# Patient Record
Sex: Female | Born: 1935 | Race: White | Hispanic: No | Marital: Married | Smoking: Former smoker
Health system: Southern US, Community
[De-identification: ages and names within clinical notes are randomized; demographics above are authoritative.]

## PROBLEM LIST (undated history)

## (undated) DIAGNOSIS — C801 Malignant (primary) neoplasm, unspecified: Secondary | ICD-10-CM

## (undated) DIAGNOSIS — I10 Essential (primary) hypertension: Secondary | ICD-10-CM

## (undated) DIAGNOSIS — M810 Age-related osteoporosis without current pathological fracture: Secondary | ICD-10-CM

## (undated) HISTORY — DX: Age-related osteoporosis without current pathological fracture: M81.0

## (undated) HISTORY — DX: Essential (primary) hypertension: I10

## (undated) HISTORY — PX: CATARACT EXTRACTION, BILATERAL: SHX1313

## (undated) HISTORY — PX: BREAST SURGERY: SHX581

---
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
## 1977-11-19 HISTORY — PX: BUNIONECTOMY: SHX129

## 1991-05-20 HISTORY — PX: COLON SURGERY: SHX602

## 1994-10-29 HISTORY — PX: BREAST LUMPECTOMY: SHX2

## 2005-05-14 ENCOUNTER — Ambulatory Visit: Payer: Self-pay | Admitting: Family Medicine

## 2005-07-07 ENCOUNTER — Ambulatory Visit: Payer: Self-pay | Admitting: Internal Medicine

## 2006-05-16 ENCOUNTER — Ambulatory Visit: Payer: Self-pay | Admitting: Family Medicine

## 2007-05-26 ENCOUNTER — Ambulatory Visit: Payer: Self-pay | Admitting: Family Medicine

## 2008-05-26 ENCOUNTER — Ambulatory Visit: Payer: Self-pay | Admitting: Family Medicine

## 2008-07-14 ENCOUNTER — Ambulatory Visit: Payer: Self-pay | Admitting: Gastroenterology

## 2009-06-27 ENCOUNTER — Ambulatory Visit: Payer: Self-pay | Admitting: Family Medicine

## 2009-07-06 ENCOUNTER — Ambulatory Visit: Payer: Self-pay | Admitting: Family Medicine

## 2009-08-31 DIAGNOSIS — M25519 Pain in unspecified shoulder: Secondary | ICD-10-CM | POA: Insufficient documentation

## 2009-10-21 ENCOUNTER — Ambulatory Visit: Payer: Self-pay | Admitting: Family Medicine

## 2009-10-24 ENCOUNTER — Emergency Department: Payer: Self-pay | Admitting: Emergency Medicine

## 2009-12-22 ENCOUNTER — Ambulatory Visit: Payer: Self-pay | Admitting: Orthopedic Surgery

## 2010-01-11 DIAGNOSIS — K5900 Constipation, unspecified: Secondary | ICD-10-CM | POA: Insufficient documentation

## 2010-01-20 ENCOUNTER — Encounter: Admission: RE | Admit: 2010-01-20 | Discharge: 2010-01-20 | Payer: Self-pay | Admitting: Orthopedic Surgery

## 2010-01-25 ENCOUNTER — Ambulatory Visit (HOSPITAL_BASED_OUTPATIENT_CLINIC_OR_DEPARTMENT_OTHER): Admission: RE | Admit: 2010-01-25 | Discharge: 2010-01-26 | Payer: Self-pay | Admitting: Orthopedic Surgery

## 2010-01-25 HISTORY — PX: ROTATOR CUFF REPAIR: SHX139

## 2010-07-11 ENCOUNTER — Ambulatory Visit: Payer: Self-pay | Admitting: Family Medicine

## 2011-07-20 ENCOUNTER — Ambulatory Visit: Payer: Self-pay | Admitting: Family Medicine

## 2012-06-12 ENCOUNTER — Ambulatory Visit: Payer: Self-pay | Admitting: Ophthalmology

## 2012-06-12 DIAGNOSIS — I499 Cardiac arrhythmia, unspecified: Secondary | ICD-10-CM

## 2012-06-24 ENCOUNTER — Ambulatory Visit: Payer: Self-pay | Admitting: Ophthalmology

## 2012-07-02 ENCOUNTER — Ambulatory Visit: Payer: Self-pay | Admitting: Family Medicine

## 2012-07-22 ENCOUNTER — Ambulatory Visit: Payer: Self-pay | Admitting: Family Medicine

## 2012-07-29 ENCOUNTER — Ambulatory Visit: Payer: Self-pay | Admitting: Ophthalmology

## 2012-09-30 ENCOUNTER — Ambulatory Visit: Payer: Self-pay | Admitting: Unknown Physician Specialty

## 2012-09-30 LAB — HM COLONOSCOPY

## 2013-09-15 ENCOUNTER — Ambulatory Visit: Payer: Self-pay | Admitting: Family Medicine

## 2014-09-13 LAB — BASIC METABOLIC PANEL
BUN: 16 mg/dL (ref 4–21)
Creatinine: 0.8 mg/dL (ref 0.5–1.1)
Glucose: 98 mg/dL
SODIUM: 142 mmol/L (ref 137–147)

## 2014-09-13 LAB — LIPID PANEL
Cholesterol: 212 mg/dL — AB (ref 0–200)
HDL: 70 mg/dL (ref 35–70)
LDL CALC: 128 mg/dL
LDL/HDL RATIO: 1.8
Triglycerides: 71 mg/dL (ref 40–160)

## 2014-09-13 LAB — CBC AND DIFFERENTIAL: WBC: 4.1 10^3/mL

## 2014-09-13 LAB — TSH: TSH: 2.07 u[IU]/mL (ref 0.41–5.90)

## 2014-09-16 ENCOUNTER — Ambulatory Visit: Payer: Self-pay | Admitting: Family Medicine

## 2014-09-16 LAB — HM MAMMOGRAPHY

## 2014-11-23 ENCOUNTER — Ambulatory Visit: Payer: Self-pay | Admitting: Family Medicine

## 2014-11-23 LAB — HM DEXA SCAN

## 2015-03-08 NOTE — Op Note (Signed)
PATIENT NAME:  Lisa Hayden, PERL MR#:  [id number] DATE OF BIRTH:  Jan 06, 1930  DATE OF PROCEDURE:  06/24/2012  PREOPERATIVE DIAGNOSIS: Visually significant cataract of the right eye.   POSTOPERATIVE DIAGNOSIS: Visually significant cataract of the right eye.   OPERATIVE PROCEDURE: Cataract extraction by phacoemulsification with implant of intraocular lens to right eye.   SURGEON: Birder Robson, MD.   ANESTHESIA:  1. Managed anesthesia care.  2. Topical tetracaine drops followed by 2% Xylocaine jelly applied in the preoperative holding area.   COMPLICATIONS: None.   TECHNIQUE:  Stop and chop.   DESCRIPTION OF PROCEDURE: The patient was examined and consented in the preoperative holding area where the aforementioned topical anesthesia was applied to the right eye.  The patient was brought back to the Operating Room where he was sat upright on the gurney and given a target to fixate upon while the eye was marked at the 3:00 and 9:00 position.  The patient was then reclined on the operating table, and lidocaine jelly was applied to the right eye.  The eye was prepped and draped in the usual sterile ophthalmic fashion and a lid speculum was placed. A paracentesis was created with the side port blade and the anterior chamber was filled with viscoelastic. A near clear corneal incision was performed with the steel keratome. A continuous curvilinear capsulorrhexis was performed with a cystotome followed by the capsulorrhexis forceps. Hydrodissection and hydrodelineation were carried out with BSS on a blunt cannula. The lens was removed in a stop and chop technique and the remaining cortical material was removed with the irrigation-aspiration handpiece. The eye was inflated with viscoelastic and the ZCT150 17.5-diopter lens, serial number [id number]  was placed in the eye and rotated to within a few degrees of the predetermined orientation at 150 degrees.  The remaining viscoelastic was removed from the  eye.  The Sinskey hook was used to rotate the toric lens into its final resting place at 60 degrees.  The eye was inflated to a physiologic pressure and found to be watertight.  The eye was dressed with Vigamox. The patient was given protective glasses to wear throughout the day and a shield with which to sleep tonight. The patient was also given drops with which to begin a drop regimen today and will follow-up with me in one day.  ____________________________ Livingston Diones. Hiawatha Dressel, MD wlp:slb D: 06/24/2012 12:32:59 ET T: 06/24/2012 13:27:34 ET JOB#: [id number]  cc: Codie Hainer L. Catlyn Shipton, MD, <Dictator> Livingston Diones Shanel Prazak MD ELECTRONICALLY SIGNED 06/25/2012 16:29

## 2015-03-08 NOTE — Op Note (Signed)
PATIENT NAME:  Lisa Hayden, Lisa Hayden MR#:  [id number] DATE OF BIRTH:  [birth year]  DATE OF PROCEDURE:  07/29/2012  PREOPERATIVE DIAGNOSIS: Visually significant cataract of the left eye.   POSTOPERATIVE DIAGNOSIS: Visually significant cataract of the left eye.   OPERATIVE PROCEDURE: Cataract extraction by phacoemulsification with implant of intraocular lens to left eye.   SURGEON: Birder Robson, MD.   ANESTHESIA:  1. Managed anesthesia care.  2. Topical tetracaine drops followed by 2% Xylocaine jelly applied in the preoperative holding area.   COMPLICATIONS: None.   TECHNIQUE:  Stop and chop.   DESCRIPTION OF PROCEDURE: The patient was examined and consented in the preoperative holding area where the aforementioned topical anesthesia was applied to the left eye and then brought back to the Operating Room where the left eye was prepped and draped in the usual sterile ophthalmic fashion and a lid speculum was placed. A paracentesis was created with the side port blade and the anterior chamber was filled with viscoelastic. A near clear corneal incision was performed with the steel keratome. A continuous curvilinear capsulorrhexis was performed with a cystotome followed by the capsulorrhexis forceps. Hydrodissection and hydrodelineation were carried out with BSS on a blunt cannula. The lens was removed in a stop and chop technique and the remaining cortical material was removed with the irrigation-aspiration handpiece. The capsular bag was inflated with viscoelastic and the Technis ZCB00 17.5-diopter lens, serial number [id number] was placed in the capsular bag without complication. The remaining viscoelastic was removed from the eye with the irrigation-aspiration handpiece. The wounds were hydrated. The anterior chamber was flushed with Miostat and the eye was inflated to physiologic pressure. The wounds were found to be water tight. The eye was dressed with Vigamox. The patient was given protective  glasses to wear throughout the day and a shield with which to sleep tonight. The patient was also given drops with which to begin a drop regimen today and will follow-up with me in one day.   ____________________________ Livingston Diones. Jamien Casanova, MD wlp:cms D: 07/29/2012 17:17:22 ET T: 07/29/2012 17:37:21 ET JOB#: [id number]  cc: Excell Neyland L. Levonne Carreras, MD, <Dictator> Livingston Diones Jenney Brester MD ELECTRONICALLY SIGNED 07/31/2012 13:13

## 2015-03-10 ENCOUNTER — Ambulatory Visit
Admit: 2015-03-10 | Disposition: A | Discharge status: Home / Self Care | Payer: Self-pay | Attending: Urology | Admitting: Urology

## 2015-05-02 ENCOUNTER — Telehealth: Payer: Self-pay | Admitting: Family Medicine

## 2015-05-02 ENCOUNTER — Encounter: Payer: Self-pay | Admitting: Emergency Medicine

## 2015-05-02 ENCOUNTER — Emergency Department
Admission: EM | Admit: 2015-05-02 | Discharge: 2015-05-02 | Disposition: A | Discharge status: Home / Self Care | Payer: Medicare Other | Attending: Emergency Medicine | Admitting: Emergency Medicine

## 2015-05-02 ENCOUNTER — Other Ambulatory Visit: Payer: Self-pay

## 2015-05-02 ENCOUNTER — Emergency Department: Payer: Medicare Other

## 2015-05-02 DIAGNOSIS — Z87891 Personal history of nicotine dependence: Secondary | ICD-10-CM | POA: Diagnosis not present

## 2015-05-02 DIAGNOSIS — R000 Tachycardia, unspecified: Secondary | ICD-10-CM | POA: Insufficient documentation

## 2015-05-02 DIAGNOSIS — J45901 Unspecified asthma with (acute) exacerbation: Secondary | ICD-10-CM | POA: Insufficient documentation

## 2015-05-02 DIAGNOSIS — R0602 Shortness of breath: Secondary | ICD-10-CM | POA: Diagnosis present

## 2015-05-02 HISTORY — DX: Malignant (primary) neoplasm, unspecified: C80.1

## 2015-05-02 LAB — COMPREHENSIVE METABOLIC PANEL
ALK PHOS: 37 U/L — AB (ref 38–126)
ALT: 17 U/L (ref 14–54)
ANION GAP: 7 (ref 5–15)
AST: 23 U/L (ref 15–41)
Albumin: 4.1 g/dL (ref 3.5–5.0)
BILIRUBIN TOTAL: 0.9 mg/dL (ref 0.3–1.2)
BUN: 15 mg/dL (ref 6–20)
CALCIUM: 9.2 mg/dL (ref 8.9–10.3)
CO2: 28 mmol/L (ref 22–32)
CREATININE: 0.77 mg/dL (ref 0.44–1.00)
Chloride: 106 mmol/L (ref 101–111)
GFR calc non Af Amer: >60 mL/min (ref >60)
GLUCOSE: 155 mg/dL — AB (ref 65–99)
Potassium: 4 mmol/L (ref 3.5–5.1)
SODIUM: 141 mmol/L (ref 135–145)
TOTAL PROTEIN: 7.1 g/dL (ref 6.5–8.1)

## 2015-05-02 LAB — CBC
HCT: 45.7 % (ref 35.0–47.0)
HEMOGLOBIN: 14.8 g/dL (ref 12.0–16.0)
MCH: 29.4 pg (ref 26.0–34.0)
MCHC: 32.4 g/dL (ref 32.0–36.0)
MCV: 90.9 fL (ref 80.0–100.0)
Platelets: 234 10*3/uL (ref 150–440)
RBC: 5.02 MIL/uL (ref 3.80–5.20)
RDW: 13.3 % (ref 11.5–14.5)
WBC: 5.4 10*3/uL (ref 3.6–11.0)

## 2015-05-02 LAB — FIBRIN DERIVATIVES D-DIMER (ARMC ONLY): FIBRIN DERIVATIVES D-DIMER (ARMC): 236.88 (ref 0–499)

## 2015-05-02 LAB — TROPONIN I

## 2015-05-02 MED ORDER — IPRATROPIUM-ALBUTEROL 0.5-2.5 (3) MG/3ML IN SOLN
3.0000 mL | Freq: Once | RESPIRATORY_TRACT | Status: AC
  Administered 2015-05-02: 3 mL via RESPIRATORY_TRACT

## 2015-05-02 MED ORDER — IPRATROPIUM-ALBUTEROL 0.5-2.5 (3) MG/3ML IN SOLN
RESPIRATORY_TRACT | Status: AC
Start: 2015-05-02 — End: 2015-05-02
  Administered 2015-05-02: 3 mL via RESPIRATORY_TRACT
  Filled 2015-05-02: qty 3

## 2015-05-02 MED ORDER — ALBUTEROL SULFATE HFA 108 (90 BASE) MCG/ACT IN AERS: 2.0000 | INHALATION_SPRAY | Freq: Four times a day (QID) | RESPIRATORY_TRACT | Status: DC | PRN

## 2015-05-02 NOTE — Telephone Encounter (Signed)
Pt was discharged today from [facility] ER for asthma attack.  Where can I work pt in?  CB#[phone] or [phone]

## 2015-05-02 NOTE — Telephone Encounter (Signed)
Appointment scheduled for 05/04/2015@12 .00/MJ

## 2015-05-02 NOTE — ED Notes (Signed)
SOB x 4 days, states history of asthma but not recent, tight cough noted

## 2015-05-02 NOTE — Discharge Instructions (Signed)
Please seek medical attention for any high fevers, chest pain, shortness of breath, change in behavior, persistent vomiting, bloody stool or any other new or concerning symptoms. ° °Shortness of Breath °Shortness of breath means you have trouble breathing. It could also mean that you have a medical problem. You should get immediate medical care for shortness of breath. °CAUSES  °· Not enough oxygen in the air such as with high altitudes or a smoke-filled room. °· Certain lung diseases, infections, or problems. °· Heart disease or conditions, such as angina or heart failure. °· Low red blood cells (anemia). °· Poor physical fitness, which can cause shortness of breath when you exercise. °· Chest or back injuries or stiffness. °· Being overweight. °· Smoking. °· Anxiety, which can make you feel like you are not getting enough air. °DIAGNOSIS  °Serious medical problems can often be found during your physical exam. Tests may also be done to determine why you are having shortness of breath. Tests may include: °· Chest X-rays. °· Lung function tests. °· Blood tests. °· An electrocardiogram (ECG). °· An ambulatory electrocardiogram. An ambulatory ECG records your heartbeat patterns over a 24-hour period. °· Exercise testing. °· A transthoracic echocardiogram (TTE). During echocardiography, sound waves are used to evaluate how blood flows through your heart. °· A transesophageal echocardiogram (TEE). °· Imaging scans. °Your health care provider may not be able to find a cause for your shortness of breath after your exam. In this case, it is important to have a follow-up exam with your health care provider as directed.  °TREATMENT  °Treatment for shortness of breath depends on the cause of your symptoms and can vary greatly. °HOME CARE INSTRUCTIONS  °· Do not smoke. Smoking is a common cause of shortness of breath. If you smoke, ask for help to quit. °· Avoid being around chemicals or things that may bother your breathing,  such as paint fumes and dust. °· Rest as needed. Slowly resume your usual activities. °· If medicines were prescribed, take them as directed for the full length of time directed. This includes oxygen and any inhaled medicines. °· Keep all follow-up appointments as directed by your health care provider. °SEEK MEDICAL CARE IF:  °· Your condition does not improve in the time expected. °· You have a hard time doing your normal activities even with rest. °· You have any new symptoms. °SEEK IMMEDIATE MEDICAL CARE IF:  °· Your shortness of breath gets worse. °· You feel light-headed, faint, or develop a cough not controlled with medicines. °· You start coughing up blood. °· You have pain with breathing. °· You have chest pain or pain in your arms, shoulders, or abdomen. °· You have a fever. °· You are unable to walk up stairs or exercise the way you normally do. °MAKE SURE YOU: °· Understand these instructions. °· Will watch your condition. °· Will get help right away if you are not doing well or get worse. °Document Released: 07/31/2001 Document Revised: 11/10/2013 Document Reviewed: 01/21/2012 °ExitCare® Patient Information ©2015 [employer]. This information is not intended to replace advice given to you by your health care provider. Make sure you discuss any questions you have with your health care provider. ° °

## 2015-05-02 NOTE — ED Provider Notes (Signed)
[facility] Emergency Department Provider Note    ____________________________________________  Time seen: 0820  I have reviewed the triage vital signs and the nursing notes.   HISTORY  Chief Complaint Shortness of Breath   History limited by: Not Limited   HPI Lisa Hayden is a 81 y.o. female who presents to the emergency department with 4 days of shortness of breath. She states that her symptoms were gradually getting worse until they were worse yesterday. They have actually gotten a little better today. She states that she has a history of asthma back in her 43s and that this slightly reminded her of that.She denies any fevers. Denies bringing anything up with her cough. Denies any fevers.     Past Medical History  Diagnosis Date  . Asthma   . Cancer     There are no active problems to display for this patient.   Past Surgical History  Procedure Laterality Date  . Colon surgery    . Breast surgery    . Tonsillectomy      No current outpatient prescriptions on file.  Allergies Actonel  No family history on file.  Social History History  Substance Use Topics  . Smoking status: Former Research scientist (life sciences)  . Smokeless tobacco: Not on file  . Alcohol Use: 4.2 oz/week    7 Standard drinks or equivalent per week    Review of Systems  Constitutional: Negative for fever. Cardiovascular: Negative for chest pain. Respiratory: Positive for shortness of breath. Gastrointestinal: Negative for abdominal pain, vomiting and diarrhea. Genitourinary: Negative for dysuria. Musculoskeletal: Negative for back pain. Skin: Negative for rash. Neurological: Negative for headaches, focal weakness or numbness.   10-point ROS otherwise negative.  ____________________________________________   PHYSICAL EXAM:  VITAL SIGNS: ED Triage Vitals  Enc Vitals Group     BP 05/02/15 0740 171/88 mmHg     Pulse Rate 05/02/15 0740 106     Resp 05/02/15 0740 20      Temp 05/02/15 0740 97.6 F (36.4 C)     Temp Source 05/02/15 0740 Oral     SpO2 05/02/15 0740 98 %     Weight 05/02/15 0740 130 lb (58.968 kg)     Height 05/02/15 0740 5\' 3"  (1.6 m)     Head Cir --      Peak Flow --      Pain Score 05/02/15 0741 0   Constitutional: Alert and oriented. Well appearing and in no distress. Eyes: Conjunctivae are normal. PERRL. Normal extraocular movements. ENT   Head: Normocephalic and atraumatic.   Nose: No congestion/rhinnorhea.   Mouth/Throat: Mucous membranes are moist.   Neck: No stridor. Hematological/Lymphatic/Immunilogical: No cervical lymphadenopathy. Cardiovascular: Tachycardic, regular rhythm.  No murmurs, rubs, or gallops. Respiratory: Normal respiratory effort without tachypnea nor retractions. Breath sounds are clear and equal bilaterally. No wheezes/rales/rhonchi. Gastrointestinal: Soft and nontender. No distention.  Genitourinary: Deferred Musculoskeletal: Normal range of motion in all extremities. No joint effusions.  No lower extremity tenderness nor edema. Neurologic:  Normal speech and language. No gross focal neurologic deficits are appreciated. Speech is normal.  Skin:  Skin is warm, dry and intact. No rash noted. Psychiatric: Mood and affect are normal. Speech and behavior are normal. Patient exhibits appropriate insight and judgment.  ____________________________________________    LABS (pertinent positives/negatives)  Labs Reviewed  COMPREHENSIVE METABOLIC PANEL - Abnormal; Notable for the following:    Glucose, Bld 155 (*)    Alkaline Phosphatase 37 (*)    All other components  within normal limits  CBC  TROPONIN I  FIBRIN DERIVATIVES D-DIMER ([facility] ONLY)     ____________________________________________   EKG  I, Nance Pear, attending physician, personally viewed and interpreted this EKG  EKG Time: 0742 Rate: 107 Rhythm: sinus tachycardia Axis: left axis deviation Intervals: qtc 472 QRS:  q waves v1, v2, v3 ST changes: no st elevation    ____________________________________________    RADIOLOGY  CXR IMPRESSION: No acute cardiopulmonary disease.  ____________________________________________   PROCEDURES  Procedure(s) performed: None  Critical Care performed: No  ____________________________________________   INITIAL IMPRESSION / ASSESSMENT AND PLAN / ED COURSE  Pertinent labs & imaging results that were available during my care of the patient were reviewed by me and considered in my medical decision making (see chart for details).  Patient here with shortness of breath, cough or 4 days. On exam patient without any wheezing. Patient does state that she feels better today than she did yesterday. Chest x-ray blood work and EKG without concerning findings. Will plan on discharging home with albuterol inhaler. Discussed return precautions with patient.  ____________________________________________   FINAL CLINICAL IMPRESSION(S) / ED DIAGNOSES  Final diagnoses:  Shortness of breath     Nance Pear, MD 05/02/15 [phone]

## 2015-05-03 DIAGNOSIS — M549 Dorsalgia, unspecified: Secondary | ICD-10-CM | POA: Insufficient documentation

## 2015-05-03 DIAGNOSIS — Z6824 Body mass index (BMI) 24.0-24.9, adult: Secondary | ICD-10-CM | POA: Insufficient documentation

## 2015-05-03 DIAGNOSIS — N952 Postmenopausal atrophic vaginitis: Secondary | ICD-10-CM | POA: Insufficient documentation

## 2015-05-03 DIAGNOSIS — I10 Essential (primary) hypertension: Secondary | ICD-10-CM | POA: Insufficient documentation

## 2015-05-03 DIAGNOSIS — N904 Leukoplakia of vulva: Secondary | ICD-10-CM | POA: Insufficient documentation

## 2015-05-03 DIAGNOSIS — E559 Vitamin D deficiency, unspecified: Secondary | ICD-10-CM | POA: Insufficient documentation

## 2015-05-03 DIAGNOSIS — D709 Neutropenia, unspecified: Secondary | ICD-10-CM | POA: Insufficient documentation

## 2015-05-03 DIAGNOSIS — M79673 Pain in unspecified foot: Secondary | ICD-10-CM | POA: Insufficient documentation

## 2015-05-03 DIAGNOSIS — M778 Other enthesopathies, not elsewhere classified: Secondary | ICD-10-CM | POA: Insufficient documentation

## 2015-05-03 DIAGNOSIS — IMO0001 Reserved for inherently not codable concepts without codable children: Secondary | ICD-10-CM | POA: Insufficient documentation

## 2015-05-03 DIAGNOSIS — C50919 Malignant neoplasm of unspecified site of unspecified female breast: Secondary | ICD-10-CM | POA: Insufficient documentation

## 2015-05-03 DIAGNOSIS — J45909 Unspecified asthma, uncomplicated: Secondary | ICD-10-CM | POA: Insufficient documentation

## 2015-05-03 DIAGNOSIS — R030 Elevated blood-pressure reading, without diagnosis of hypertension: Secondary | ICD-10-CM

## 2015-05-03 DIAGNOSIS — M79606 Pain in leg, unspecified: Secondary | ICD-10-CM | POA: Insufficient documentation

## 2015-05-04 ENCOUNTER — Ambulatory Visit (INDEPENDENT_AMBULATORY_CARE_PROVIDER_SITE_OTHER): Payer: Medicare Other | Admitting: Family Medicine

## 2015-05-04 ENCOUNTER — Encounter: Payer: Self-pay | Admitting: Family Medicine

## 2015-05-04 VITALS — BP 126/84 | HR 84 | Temp 97.5°F | Resp 16 | Wt 134.0 lb

## 2015-05-04 DIAGNOSIS — J45909 Unspecified asthma, uncomplicated: Secondary | ICD-10-CM

## 2015-05-04 DIAGNOSIS — Z1389 Encounter for screening for other disorder: Secondary | ICD-10-CM | POA: Diagnosis not present

## 2015-05-04 DIAGNOSIS — J40 Bronchitis, not specified as acute or chronic: Secondary | ICD-10-CM

## 2015-05-04 MED ORDER — PREDNISONE 10 MG PO TABS: 10.0000 mg | ORAL_TABLET | Freq: Every day | ORAL | Status: DC

## 2015-05-04 MED ORDER — AZITHROMYCIN 250 MG PO TABS: ORAL_TABLET | ORAL | Status: DC

## 2015-05-04 NOTE — Progress Notes (Signed)
Subjective:    Patient ID: Lisa Hayden, female    DOB: 04/20/1930, 81 y.o.   MRN: [id number]  Asthma She complains of cough, difficulty breathing, frequent throat clearing, hoarse voice, shortness of breath and wheezing. There is no chest tightness or hemoptysis. Sputum production: green. This is a recurrent problem. The current episode started in the past 7 days. The problem occurs constantly. The cough is productive of sputum. Associated symptoms include postnasal drip and rhinorrhea. Pertinent negatives include no appetite change, chest pain, ear pain, fever, headaches, sneezing, sore throat or trouble swallowing. Her symptoms are aggravated by pollen. Her symptoms are alleviated by beta-agonist. She reports minimal improvement on treatment. Her past medical history is significant for asthma.    Follow up Hospitalization  Patient was admitted to the ED on 05/02/2015. She was treated for an asthma attack. Treatment for this included Albuterol treatment, and RX for Ventolin. She reports compliance with treatment. She reports this condition is Improved. (But has reoccurrence)   Also reviewed records from [place] [facility] and explained findings to patient. Has handout of vaginal atrophy, and would like to be treated for that. Has not been using steroid cream for Lichen Sclerosis. Will start using and follow up with Gyn.    ------------------------------------------------------------------------------------     Review of Systems  Constitutional: Positive for fatigue. Negative for fever, chills, diaphoresis, activity change, appetite change and unexpected weight change.  HENT: Positive for congestion, hoarse voice, postnasal drip and rhinorrhea. Negative for drooling, ear discharge, ear pain, facial swelling, hearing loss, mouth sores, nosebleeds, sinus pressure, sneezing, sore throat, tinnitus, trouble swallowing and voice change.   Eyes: Positive for redness and itching. Negative for  photophobia, pain, discharge and visual disturbance.  Respiratory: Positive for cough, shortness of breath and wheezing. Negative for apnea, hemoptysis, choking, chest tightness and stridor. Sputum production: green.   Cardiovascular: Negative for chest pain, palpitations and leg swelling.  Neurological: Negative for dizziness, facial asymmetry, light-headedness and headaches.   Patient Active Problem List   Diagnosis Date Noted  . Reactive airway disease 05/04/2015  . Bronchitis 05/04/2015  . Asthma without status asthmaticus 05/03/2015  . Back ache 05/03/2015  . Body mass index (BMI) of 24.0-24.9 in adult 05/03/2015  . Breast CA 05/03/2015  . Blood pressure elevated 05/03/2015  . Foot pain 05/03/2015  . BP (high blood pressure) 05/03/2015  . Leg pain 05/03/2015  . Lichen sclerosus of female genitalia 05/03/2015  . Neutropenia 05/03/2015  . Tendinitis of wrist 05/03/2015  . Atrophy of vagina 05/03/2015  . Avitaminosis D 05/03/2015  . CN (constipation) 01/11/2010  . Mechanical and motor problems with internal organs 09/21/2009  . Arthralgia of shoulder 08/31/2009  . Personal history of malignant neoplasm of breast 04/13/2009  . OP (osteoporosis) 04/13/2009   Past Medical History  Diagnosis Date  . Asthma   . Cancer   . Hypertension   . Osteoporosis    Current Outpatient Prescriptions on File Prior to Visit  Medication Sig  . albuterol (PROVENTIL HFA;VENTOLIN HFA) 108 (90 BASE) MCG/ACT inhaler Inhale 2 puffs into the lungs every 6 (six) hours as needed for wheezing or shortness of breath.  Marland Kitchen alendronate (FOSAMAX) 70 MG tablet Take 70 mg by mouth once a week. On Sunday  . cholecalciferol (VITAMIN D) 1000 UNITS tablet Take 1,000 Units by mouth every evening. With dinner  . Omega-3 Fatty Acids (FISH OIL) 1000 MG CAPS Take 1 capsule by mouth daily.   No current facility-administered medications on  file prior to visit.   Allergies  Allergen Reactions  . Actonel [Risedronate  Sodium] Other (See Comments)    headache  . Cortisone     very alert and did not sleep for 24 hrs  . Daypro  [Oxaprozin]   . Meloxicam    Past Surgical History  Procedure Laterality Date  . Breast surgery    . Tonsillectomy  1938    and adenoidectomy  . Cataract extraction, bilateral      left 07/29/2012; right 06/24/2012  . Rotator cuff repair Right 01/25/10  . Bunionectomy  1979  . Appendectomy  1953  . Breast lumpectomy Left 10/29/94    negative nodes. Treated with radiation only  . Colon surgery  05/1991    secondary to cancer    History   Social History  . Marital Status: Married    Spouse Name: [name]  . Number of Children: 1  . Years of Education: College   Occupational History  . Retired    Social History Main Topics  . Smoking status: Former Research scientist (life sciences)  . Smokeless tobacco: Never Used  . Alcohol Use: 4.2 oz/week    7 Standard drinks or equivalent per week  . Drug Use: No  . Sexual Activity: Not on file   Other Topics Concern  . Not on file   Social History Narrative   Family History  Problem Relation Age of Onset  . CVA Mother   . Congestive Heart Failure Mother   . Diabetes Sister   . Asthma Father         Objective:   Physical Exam  Constitutional: She appears well-developed and well-nourished.  Cardiovascular: Normal rate and regular rhythm.   Pulmonary/Chest: Effort normal. She has wheezes.    BP 126/84 mmHg  Pulse 84  Temp(Src) 97.5 F (36.4 C) (Oral)  Resp 16  Wt 134 lb (60.782 kg)       Assessment & Plan:    1. Asthma, unspecified asthma severity, uncomplicated Will treat with prednisone. Continue inhaler as needed and reassess in 2 weeks.   - predniSONE (DELTASONE) 10 MG tablet; Take 1 tablet (10 mg total) by mouth daily with breakfast. 6 for one day, and the 5 for one day and then 4 for one day and then 3 for one day and 2 for one day and then 1 pill  Dispense: 21 tablet; Refill: 0  2. Bronchitis Condition is worsening. Will  start medication for better control.   Recheck in 2 weeks.  - azithromycin (ZITHROMAX) 250 MG tablet; 2 tabs on day one and then one a day.  Dispense: 6 tablet; Refill: 0  Margarita Rana, MD

## 2015-05-18 ENCOUNTER — Ambulatory Visit (INDEPENDENT_AMBULATORY_CARE_PROVIDER_SITE_OTHER): Payer: Medicare Other | Admitting: Family Medicine

## 2015-05-18 ENCOUNTER — Encounter: Payer: Self-pay | Admitting: Family Medicine

## 2015-05-18 VITALS — BP 136/70 | HR 80 | Temp 97.8°F | Resp 16 | Wt 135.0 lb

## 2015-05-18 DIAGNOSIS — J40 Bronchitis, not specified as acute or chronic: Secondary | ICD-10-CM

## 2015-05-18 NOTE — Progress Notes (Signed)
Subjective:    Patient ID: Lisa Hayden, female    DOB: 19-Dec-1929, 81 y.o.   MRN: [id number]  Asthma She complains of cough. There is no chest tightness, difficulty breathing, frequent throat clearing, hemoptysis, hoarse voice, shortness of breath, sputum production or wheezing. This is a recurrent problem. The current episode started 1 to 4 weeks ago. The problem has been rapidly improving. The cough is non-productive. Associated symptoms include malaise/fatigue, myalgias, postnasal drip, rhinorrhea and sneezing. Pertinent negatives include no appetite change, chest pain, dyspnea on exertion, ear congestion, ear pain, fever, headaches, heartburn, nasal congestion, orthopnea, sore throat, sweats, trouble swallowing or weight loss. Her symptoms are alleviated by oral steroids and steroid inhaler. Her past medical history is significant for asthma.   Bronchitis Follow Up Pt was seen 2 weeks ago, and was put on a Z-Pak for bronchitis. Pt reports both her asthma exacerbation, as well as bronchitis is 90% improved.  Review of Systems  Constitutional: Positive for malaise/fatigue. Negative for fever, weight loss and appetite change.  HENT: Positive for postnasal drip, rhinorrhea and sneezing. Negative for ear pain, hoarse voice, sore throat and trouble swallowing.   Respiratory: Positive for cough. Negative for hemoptysis, sputum production, shortness of breath and wheezing.   Cardiovascular: Negative for chest pain and dyspnea on exertion.  Gastrointestinal: Negative for heartburn.  Musculoskeletal: Positive for myalgias.  Neurological: Negative for headaches.        Patient Active Problem List   Diagnosis Date Noted  . Reactive airway disease 05/04/2015  . Bronchitis 05/04/2015  . Asthma without status asthmaticus 05/03/2015  . Back ache 05/03/2015  . Body mass index (BMI) of 24.0-24.9 in adult 05/03/2015  . Breast CA 05/03/2015  . Blood pressure elevated 05/03/2015  . Foot pain  05/03/2015  . BP (high blood pressure) 05/03/2015  . Leg pain 05/03/2015  . Lichen sclerosus of female genitalia 05/03/2015  . Neutropenia 05/03/2015  . Tendinitis of wrist 05/03/2015  . Atrophy of vagina 05/03/2015  . Avitaminosis D 05/03/2015  . CN (constipation) 01/11/2010  . Mechanical and motor problems with internal organs 09/21/2009  . Arthralgia of shoulder 08/31/2009  . Personal history of malignant neoplasm of breast 04/13/2009  . OP (osteoporosis) 04/13/2009   Past Medical History  Diagnosis Date  . Asthma   . Cancer   . Hypertension   . Osteoporosis    Current Outpatient Prescriptions on File Prior to Visit  Medication Sig  . albuterol (PROVENTIL HFA;VENTOLIN HFA) 108 (90 BASE) MCG/ACT inhaler Inhale 2 puffs into the lungs every 6 (six) hours as needed for wheezing or shortness of breath. (Patient not taking: Reported on 05/18/2015)  . alendronate (FOSAMAX) 70 MG tablet Take 70 mg by mouth once a week. On Sunday  . cholecalciferol (VITAMIN D) 1000 UNITS tablet Take 1,000 Units by mouth every evening. With dinner  . Omega-3 Fatty Acids (FISH OIL) 1000 MG CAPS Take 1 capsule by mouth daily.   No current facility-administered medications on file prior to visit.   Allergies  Allergen Reactions  . Actonel [Risedronate Sodium] Other (See Comments)    headache  . Cortisone     very alert and did not sleep for 24 hrs  . Daypro  [Oxaprozin]   . Meloxicam    Past Surgical History  Procedure Laterality Date  . Breast surgery    . Tonsillectomy  1938    and adenoidectomy  . Cataract extraction, bilateral      left 07/29/2012; right 06/24/2012  .  Rotator cuff repair Right 01/25/10  . Bunionectomy  1979  . Appendectomy  1953  . Breast lumpectomy Left 10/29/94    negative nodes. Treated with radiation only  . Colon surgery  05/1991    secondary to cancer    History   Social History  . Marital Status: Married    Spouse Name: [name]  . Number of Children: 1  . Years of  Education: College   Occupational History  . Retired    Social History Main Topics  . Smoking status: Former Research scientist (life sciences)  . Smokeless tobacco: Never Used  . Alcohol Use: 4.2 oz/week    7 Standard drinks or equivalent per week  . Drug Use: No  . Sexual Activity: Not on file   Other Topics Concern  . Not on file   Social History Narrative   Family History  Problem Relation Age of Onset  . CVA Mother   . Congestive Heart Failure Mother   . Diabetes Sister   . Asthma Father     Objective:   Physical Exam  Constitutional: She is oriented to person, place, and time. She appears well-developed and well-nourished.  Cardiovascular: Normal rate, regular rhythm and normal heart sounds.   Pulmonary/Chest: Effort normal and breath sounds normal.  Neurological: She is alert and oriented to person, place, and time.  Psychiatric: She has a normal mood and affect. Her behavior is normal. Judgment and thought content normal.   BP 136/70 mmHg  Pulse 80  Temp(Src) 97.8 F (36.6 C) (Oral)  Resp 16  Wt 135 lb (61.236 kg)  SpO2 98%          Assessment & Plan:   1. Bronchitis  Resolved. Follow up as needed.  Keep inhaler just in case.

## 2015-11-17 ENCOUNTER — Telehealth: Payer: Self-pay | Admitting: Family Medicine

## 2015-11-17 NOTE — Telephone Encounter (Signed)
Pt contacted office for refill request on the following medications:  alendronate (FOSAMAX) 70 MG tablet.  [facility].  [id number]  Pt is asking if she should continue to take this medication?

## 2015-11-17 NOTE — Telephone Encounter (Signed)
Please review-aa 

## 2015-11-18 MED ORDER — ALENDRONATE SODIUM 70 MG PO TABS: 70.0000 mg | ORAL_TABLET | ORAL | Status: DC

## 2015-11-18 NOTE — Telephone Encounter (Signed)
Patient advised as directed below. RX sent to pharmacy.  

## 2015-11-18 NOTE — Telephone Encounter (Signed)
Usually stay on medication for 5 years. Ok to refill for one year and notify patent. Thanks.

## 2015-12-20 ENCOUNTER — Other Ambulatory Visit: Payer: Self-pay

## 2015-12-20 DIAGNOSIS — M810 Age-related osteoporosis without current pathological fracture: Secondary | ICD-10-CM

## 2015-12-20 MED ORDER — ALENDRONATE SODIUM 70 MG PO TABS: 70.0000 mg | ORAL_TABLET | ORAL | Status: DC

## 2016-05-08 ENCOUNTER — Encounter: Payer: Self-pay | Admitting: Family Medicine

## 2016-05-08 ENCOUNTER — Ambulatory Visit (INDEPENDENT_AMBULATORY_CARE_PROVIDER_SITE_OTHER): Payer: Medicare Other | Admitting: Family Medicine

## 2016-05-08 VITALS — BP 146/70 | HR 80 | Temp 97.4°F | Resp 20 | Ht 61.0 in | Wt 137.0 lb

## 2016-05-08 DIAGNOSIS — M810 Age-related osteoporosis without current pathological fracture: Secondary | ICD-10-CM | POA: Diagnosis not present

## 2016-05-08 DIAGNOSIS — I10 Essential (primary) hypertension: Secondary | ICD-10-CM | POA: Diagnosis not present

## 2016-05-08 DIAGNOSIS — Z0000 Encounter for general adult medical examination without abnormal findings: Secondary | ICD-10-CM

## 2016-05-08 DIAGNOSIS — Z1231 Encounter for screening mammogram for malignant neoplasm of breast: Secondary | ICD-10-CM

## 2016-05-08 DIAGNOSIS — R2681 Unsteadiness on feet: Secondary | ICD-10-CM

## 2016-05-08 DIAGNOSIS — Z780 Asymptomatic menopausal state: Secondary | ICD-10-CM

## 2016-05-08 DIAGNOSIS — R42 Dizziness and giddiness: Secondary | ICD-10-CM

## 2016-05-08 DIAGNOSIS — C189 Malignant neoplasm of colon, unspecified: Secondary | ICD-10-CM | POA: Insufficient documentation

## 2016-05-08 NOTE — Progress Notes (Signed)
Patient: Lisa Hayden, Female    DOB: 1930/06/19, 82 y.o.   MRN: [id number] Visit Date: 05/08/2016  Today's Provider: Margarita Rana, MD   Chief Complaint  Patient presents with  . Medicare Wellness   Subjective:    Annual wellness visit Lisa Hayden is a 82 y.o. female. She feels well. She reports exercising 2 days a week. She reports she is sleeping well. 09/16/14 Mammogram-BI-RADS 2 11/23/14 BMD-Osteoporosis 09/30/12 Colonoscopy-polyp, internal hemorrhoids, Dr. Vira Agar -----------------------------------------------------------  Vertigo - Dizziness: Patient presents with dizziness .  The dizziness has been present for 2 days. The patient describes the symptoms as vertigo. Symptoms are exacerbated by looking rising from supine position The patient also complains of none. Patient denies otalgia.  She has been treated with nothing.    Review of Systems  Constitutional: Negative.        Irritability  HENT: Negative.   Eyes: Negative.   Respiratory: Negative.   Cardiovascular: Negative.   Gastrointestinal: Negative.   Endocrine: Negative.   Genitourinary: Negative.   Musculoskeletal: Positive for back pain and neck stiffness.  Skin: Negative.   Allergic/Immunologic: Negative.   Neurological: Positive for dizziness.  Hematological: Negative.   Psychiatric/Behavioral: Negative.     Social History   Social History  . Marital Status: Married    Spouse Name: [name]  . Number of Children: 1  . Years of Education: College   Occupational History  . Retired    Social History Main Topics  . Smoking status: Former Research scientist (life sciences)  . Smokeless tobacco: Never Used  . Alcohol Use: 4.2 oz/week    7 Standard drinks or equivalent per week  . Drug Use: No  . Sexual Activity: Not on file   Other Topics Concern  . Not on file   Social History Narrative    Past Medical History  Diagnosis Date  . Asthma   . Cancer ([place])   . Hypertension   . Osteoporosis       Patient Active Problem List   Diagnosis Date Noted  . Malignant neoplasm of colon ([place]) 05/08/2016  . Reactive airway disease 05/04/2015  . Bronchitis 05/04/2015  . Asthma without status asthmaticus 05/03/2015  . Back ache 05/03/2015  . Body mass index (BMI) of 24.0-24.9 in adult 05/03/2015  . Breast CA ([place]) 05/03/2015  . Blood pressure elevated 05/03/2015  . Foot pain 05/03/2015  . BP (high blood pressure) 05/03/2015  . Leg pain 05/03/2015  . Lichen sclerosus of female genitalia 05/03/2015  . Neutropenia ([place]) 05/03/2015  . Tendinitis of wrist 05/03/2015  . Atrophy of vagina 05/03/2015  . Avitaminosis D 05/03/2015  . CN (constipation) 01/11/2010  . Mechanical and motor problems with internal organs 09/21/2009  . Arthralgia of shoulder 08/31/2009  . Personal history of malignant neoplasm of breast 04/13/2009  . OP (osteoporosis) 04/13/2009    Past Surgical History  Procedure Laterality Date  . Breast surgery    . Tonsillectomy  1938    and adenoidectomy  . Cataract extraction, bilateral      left 07/29/2012; right 06/24/2012  . Rotator cuff repair Right 01/25/10  . Bunionectomy  1979  . Appendectomy  1953  . Breast lumpectomy Left 10/29/94    negative nodes. Treated with radiation only  . Colon surgery  05/1991    secondary to cancer     Her family history includes Asthma in her father; CVA in her mother; Congestive Heart Failure in her mother; Diabetes in her sister.  Current Meds  Medication Sig  . alendronate (FOSAMAX) 70 MG tablet Take 1 tablet (70 mg total) by mouth once a week. On Sunday  . cholecalciferol (VITAMIN D) 1000 UNITS tablet Take 1,000 Units by mouth every evening. With dinner  . conjugated estrogens (PREMARIN) vaginal cream Place vaginally.    Patient Care Team: Margarita Rana, MD as PCP - General (Family Medicine)    Objective:   Vitals: BP 146/70 mmHg  Pulse 80  Temp(Src) 97.4 F (36.3 C) (Oral)  Resp 20  Ht 5\' 1"  (1.549 m)  Wt 137 lb  (62.143 kg)  BMI 25.90 kg/m2  Physical Exam  Constitutional: She is oriented to person, place, and time. She appears well-developed and well-nourished.  HENT:  Head: Normocephalic and atraumatic.  Right Ear: Tympanic membrane, external ear and ear canal normal.  Left Ear: Tympanic membrane, external ear and ear canal normal.  Nose: Nose normal.  Mouth/Throat: Uvula is midline, oropharynx is clear and moist and mucous membranes are normal.  Eyes: Conjunctivae, EOM and lids are normal. Pupils are equal, round, and reactive to light.  Neck: Trachea normal and normal range of motion. Neck supple. Carotid bruit is not present. No thyroid mass and no thyromegaly present.  Cardiovascular: Normal rate, regular rhythm and normal heart sounds.   Pulmonary/Chest: Effort normal and breath sounds normal.  Abdominal: Soft. Normal appearance and bowel sounds are normal. There is no hepatosplenomegaly. There is no tenderness.  Genitourinary: No breast swelling, tenderness or discharge.  Musculoskeletal: Normal range of motion.  Lymphadenopathy:    She has no cervical adenopathy.    She has no axillary adenopathy.  Neurological: She is alert and oriented to person, place, and time. She has normal strength and normal reflexes. No cranial nerve deficit. She displays a negative Romberg sign.  Skin: Skin is warm, dry and intact.  Psychiatric: She has a normal mood and affect. Her speech is normal and behavior is normal. Judgment and thought content normal. Cognition and memory are normal.    Activities of Daily Living In your present state of health, do you have any difficulty performing the following activities: 05/08/2016  Hearing? N  Vision? N  Difficulty concentrating or making decisions? N  Walking or climbing stairs? N  Dressing or bathing? N  Doing errands, shopping? N    Fall Risk Assessment Fall Risk  05/08/2016 05/18/2015  Falls in the past year? No No     Depression Screen PHQ 2/9 Scores  05/08/2016 05/18/2015  PHQ - 2 Score 0 -  Exception Documentation - Other- indicate reason in comment box  Not completed - Pt currently moving out of apartment, was stressful event for her    Cognitive Testing - 6-CIT  Correct? Score   What year is it? yes 0 0 or 4  What month is it? yes 0 0 or 3  Memorize:    Pia Mau,  44,  [place],      What time is it? (within 1 hour) yes 0 0 or 3  Count backwards from 20 yes 0 0, 2, or 4  Name the months of the year yes 0 0, 2, or 4  Repeat name & address above yes 0 0, 2, 4, 6, 8, or 10       TOTAL SCORE  0/28   Interpretation:  Normal  Normal (0-7) Abnormal (8-28)       Assessment & Plan:     Annual Wellness Visit  Reviewed patient's  Family Medical History Reviewed and updated list of patient's medical providers Assessment of cognitive impairment was done Assessed patient's functional ability Established a written schedule for health screening [place] Completed and Reviewed  Exercise Activities and Dietary recommendations Goals    None      Immunization History  Administered Date(s) Administered  . Pneumococcal Conjugate-13 10/11/2014  . Pneumococcal Polysaccharide-23 05/19/2001  . Td 05/19/2001  . Tdap 03/22/2011  . Zoster 11/19/1998   ------------------------------------------------------------------------------------------------------------  1. Medicare annual wellness visit, subsequent Stable. Patient advised to continue eating healthy and exercise daily.  2. Dizzy spells New problem. Patient referred to ENT for evaluation and follow-up - Ambulatory referral to ENT  3. Essential hypertension F/U pending lab report. - CBC with Differential/Platelet - Comprehensive metabolic panel - Lipid Panel With LDL/HDL Ratio - TSH  4. OP (osteoporosis) Stable. Patient advised to continue current medication and plan of care. Patient to follow-up with Dr. Caryn Section in 6 months.  5.  Postmenopausal F/U pending lab report. - VITAMIN D 25 Hydroxy (Vit-D Deficiency, Fractures)  6. Encounter for screening mammogram for breast cancer - MM DIGITAL SCREENING BILATERAL; Future  7. Unsteady gait New problem. Patient referred to neurology for evaluation and follow-up. - Ambulatory referral to Neurology   Patient seen and examined by Dr. Jerrell Belfast, and note scribed by Philbert Riser. Dimas, CMA. I have reviewed the document for accuracy and completeness and I agree with above. - Jerrell Belfast, MD   Margarita Rana, MD  [facility]

## 2016-05-09 ENCOUNTER — Other Ambulatory Visit: Payer: Self-pay | Admitting: Family Medicine

## 2016-05-10 ENCOUNTER — Telehealth: Payer: Self-pay

## 2016-05-10 LAB — LIPID PANEL WITH LDL/HDL RATIO
Cholesterol, Total: 230 mg/dL — ABNORMAL HIGH (ref 100–199)
HDL: 78 mg/dL (ref >39)
LDL CALC: 135 mg/dL — AB (ref 0–99)
LDL/HDL RATIO: 1.7 ratio (ref 0.0–3.2)
Triglycerides: 86 mg/dL (ref 0–149)
VLDL Cholesterol Cal: 17 mg/dL (ref 5–40)

## 2016-05-10 LAB — COMPREHENSIVE METABOLIC PANEL
A/G RATIO: 2 (ref 1.2–2.2)
ALT: 9 IU/L (ref 0–32)
AST: 14 IU/L (ref 0–40)
Albumin: 4.3 g/dL (ref 3.5–4.7)
Alkaline Phosphatase: 39 IU/L (ref 39–117)
BUN/Creatinine Ratio: 16 (ref 12–28)
BUN: 13 mg/dL (ref 8–27)
Bilirubin Total: 0.9 mg/dL (ref 0.0–1.2)
CALCIUM: 9.5 mg/dL (ref 8.7–10.3)
CO2: 25 mmol/L (ref 18–29)
Chloride: 101 mmol/L (ref 96–106)
Creatinine, Ser: 0.8 mg/dL (ref 0.57–1.00)
GFR, EST AFRICAN AMERICAN: 78 mL/min/{1.73_m2} (ref >59)
GFR, EST NON AFRICAN AMERICAN: 68 mL/min/{1.73_m2} (ref >59)
GLOBULIN, TOTAL: 2.2 g/dL (ref 1.5–4.5)
Glucose: 90 mg/dL (ref 65–99)
POTASSIUM: 4.1 mmol/L (ref 3.5–5.2)
SODIUM: 142 mmol/L (ref 134–144)
TOTAL PROTEIN: 6.5 g/dL (ref 6.0–8.5)

## 2016-05-10 LAB — CBC WITH DIFFERENTIAL/PLATELET
BASOS: 1 %
Basophils Absolute: 0 10*3/uL (ref 0.0–0.2)
EOS (ABSOLUTE): 0.1 10*3/uL (ref 0.0–0.4)
EOS: 3 %
HEMATOCRIT: 45.4 % (ref 34.0–46.6)
Hemoglobin: 15 g/dL (ref 11.1–15.9)
IMMATURE GRANS (ABS): 0 10*3/uL (ref 0.0–0.1)
IMMATURE GRANULOCYTES: 0 %
LYMPHS: 34 %
Lymphocytes Absolute: 1.5 10*3/uL (ref 0.7–3.1)
MCH: 30.4 pg (ref 26.6–33.0)
MCHC: 33 g/dL (ref 31.5–35.7)
MCV: 92 fL (ref 79–97)
MONOCYTES: 8 %
Monocytes Absolute: 0.4 10*3/uL (ref 0.1–0.9)
NEUTROS PCT: 54 %
Neutrophils Absolute: 2.3 10*3/uL (ref 1.4–7.0)
Platelets: 275 10*3/uL (ref 150–379)
RBC: 4.93 x10E6/uL (ref 3.77–5.28)
RDW: 13.7 % (ref 12.3–15.4)
WBC: 4.3 10*3/uL (ref 3.4–10.8)

## 2016-05-10 LAB — VITAMIN D 25 HYDROXY (VIT D DEFICIENCY, FRACTURES): Vit D, 25-Hydroxy: 27.7 ng/mL — ABNORMAL LOW (ref 30.0–100.0)

## 2016-05-10 LAB — TSH: TSH: 2.91 u[IU]/mL (ref 0.450–4.500)

## 2016-05-10 NOTE — Telephone Encounter (Signed)
-----   Message from Margarita Rana, MD sent at 05/10/2016  2:20 PM EDT ----- Labs stable except for borderline VIt D. Can increase to 2000 iu daily and recheck annually.  Thanks.

## 2016-05-10 NOTE — Telephone Encounter (Signed)
Pt advised.   Thanks,   -Laura  

## 2016-08-29 ENCOUNTER — Other Ambulatory Visit: Payer: Self-pay | Admitting: Neurology

## 2016-08-29 DIAGNOSIS — R2689 Other abnormalities of gait and mobility: Secondary | ICD-10-CM

## 2016-09-03 ENCOUNTER — Ambulatory Visit
Admission: RE | Admit: 2016-09-03 | Discharge: 2016-09-03 | Disposition: A | Discharge status: Home / Self Care | Payer: Medicare Other | Source: Ambulatory Visit | Attending: Neurology | Admitting: Neurology

## 2016-09-03 DIAGNOSIS — R2689 Other abnormalities of gait and mobility: Secondary | ICD-10-CM | POA: Insufficient documentation

## 2016-09-26 ENCOUNTER — Encounter: Payer: Self-pay | Admitting: Family Medicine

## 2016-11-07 ENCOUNTER — Ambulatory Visit (INDEPENDENT_AMBULATORY_CARE_PROVIDER_SITE_OTHER): Payer: Medicare Other | Admitting: Family Medicine

## 2016-11-07 ENCOUNTER — Encounter: Payer: Self-pay | Admitting: Family Medicine

## 2016-11-07 VITALS — BP 132/72 | Temp 98.6°F | Resp 16 | Ht 62.0 in | Wt 135.0 lb

## 2016-11-07 DIAGNOSIS — M810 Age-related osteoporosis without current pathological fracture: Secondary | ICD-10-CM | POA: Diagnosis not present

## 2016-11-07 DIAGNOSIS — E559 Vitamin D deficiency, unspecified: Secondary | ICD-10-CM | POA: Diagnosis not present

## 2016-11-07 MED ORDER — ALENDRONATE SODIUM 70 MG PO TABS
70.0000 mg | ORAL_TABLET | ORAL | 4 refills | Status: DC
Start: 2016-11-07 — End: 2017-12-13

## 2016-11-07 NOTE — Progress Notes (Signed)
       Patient: Lisa Hayden Female    DOB: February 12, 1930   82 y.o.   MRN: [id number] Visit Date: 11/07/2016  Today's Provider: Lelon Huh, MD   Chief Complaint  Patient presents with  . Osteoporosis  . vitamin d deficiency   Subjective:    HPI  This is a previous patient of Dr. Venia Minks present today as new patient to me to establish care and follow up on chronic medical problems.   Osteoporosis, follow up: Patient was last seen in the office 6 months ago. No changes were made in medications since then. Patient is currently taking Alendronate 70mg  once weekly. She has been tolerating well without side effects. Patient reports that she had her BMD test on 10/30/2016 through [facility].   Vitamin D deficiency, follow up: Patient was last seen 6 months ago. Patient was advised to increase her Vitamin D supplement to 2000 units daily. She has been tolerating medication changes well without side effects.       Allergies  Allergen Reactions  . Actonel [Risedronate Sodium] Other (See Comments)    headache  . Cortisone     very alert and did not sleep for 24 hrs  . Daypro  [Oxaprozin]   . Meloxicam      Current Outpatient Prescriptions:  .  alendronate (FOSAMAX) 70 MG tablet, Take 1 tablet (70 mg total) by mouth once a week. On Sunday, Disp: 12 tablet, Rfl: 1 .  cholecalciferol (VITAMIN D) 1000 UNITS tablet, Take 1,000 Units by mouth every evening. With dinner, Disp: , Rfl:   Review of Systems  Constitutional: Negative.   Respiratory: Negative.   Cardiovascular: Negative.   Neurological: Negative.     Social History  Substance Use Topics  . Smoking status: Former Research scientist (life sciences)  . Smokeless tobacco: Never Used  . Alcohol use 4.2 oz/week    7 Standard drinks or equivalent per week   Objective:   BP 132/72 (BP Location: Right Arm, Patient Position: Sitting, Cuff Size: Normal)   Temp 98.6 F (37 C)   Resp 16   Ht 5\' 2"  (1.575 m)   Wt 135 lb (61.2 kg)   BMI 24.69 kg/m    Physical Exam   General Appearance:    Alert, cooperative, no distress  Eyes:    PERRL, conjunctiva/corneas clear, EOM's intact       Lungs:     Clear to auscultation bilaterally, respirations unlabored  Heart:    Regular rate and rhythm  Neurologic:   Awake, alert, oriented x 3. No apparent focal neurological           defect.           Assessment & Plan:     1. Osteoporosis, unspecified osteoporosis type, unspecified pathological fracture presence Doing well on alendronate. Check BMD Q2 years.  - alendronate (FOSAMAX) 70 MG tablet; Take 1 tablet (70 mg total) by mouth once a week. On Sunday  Dispense: 12 tablet; Refill: 4  2. Avitaminosis D Doing well on current vitamin D replacement.        Lelon Huh, MD  [facility]

## 2016-11-19 HISTORY — PX: ABDOMINAL HYSTERECTOMY: SHX81

## 2017-01-15 ENCOUNTER — Encounter: Payer: Self-pay | Admitting: Emergency Medicine

## 2017-01-15 ENCOUNTER — Emergency Department: Payer: Medicare Other

## 2017-01-15 ENCOUNTER — Inpatient Hospital Stay
Admission: EM | Admit: 2017-01-15 | Discharge: 2017-01-21 | DRG: 376 | Disposition: A | Discharge status: Other Acute Inpatient Hospital | Payer: Medicare Other | Attending: Internal Medicine | Admitting: Internal Medicine

## 2017-01-15 DIAGNOSIS — C786 Secondary malignant neoplasm of retroperitoneum and peritoneum: Principal | ICD-10-CM | POA: Diagnosis present

## 2017-01-15 DIAGNOSIS — Z7983 Long term (current) use of bisphosphonates: Secondary | ICD-10-CM

## 2017-01-15 DIAGNOSIS — I10 Essential (primary) hypertension: Secondary | ICD-10-CM | POA: Diagnosis present

## 2017-01-15 DIAGNOSIS — Z833 Family history of diabetes mellitus: Secondary | ICD-10-CM

## 2017-01-15 DIAGNOSIS — E43 Unspecified severe protein-calorie malnutrition: Secondary | ICD-10-CM | POA: Insufficient documentation

## 2017-01-15 DIAGNOSIS — Z7982 Long term (current) use of aspirin: Secondary | ICD-10-CM

## 2017-01-15 DIAGNOSIS — R109 Unspecified abdominal pain: Secondary | ICD-10-CM | POA: Diagnosis not present

## 2017-01-15 DIAGNOSIS — K56609 Unspecified intestinal obstruction, unspecified as to partial versus complete obstruction: Secondary | ICD-10-CM | POA: Diagnosis present

## 2017-01-15 DIAGNOSIS — C801 Malignant (primary) neoplasm, unspecified: Secondary | ICD-10-CM

## 2017-01-15 DIAGNOSIS — Z825 Family history of asthma and other chronic lower respiratory diseases: Secondary | ICD-10-CM

## 2017-01-15 DIAGNOSIS — Z853 Personal history of malignant neoplasm of breast: Secondary | ICD-10-CM

## 2017-01-15 DIAGNOSIS — J45909 Unspecified asthma, uncomplicated: Secondary | ICD-10-CM | POA: Diagnosis present

## 2017-01-15 DIAGNOSIS — R112 Nausea with vomiting, unspecified: Secondary | ICD-10-CM

## 2017-01-15 DIAGNOSIS — Z8249 Family history of ischemic heart disease and other diseases of the circulatory system: Secondary | ICD-10-CM

## 2017-01-15 DIAGNOSIS — M810 Age-related osteoporosis without current pathological fracture: Secondary | ICD-10-CM | POA: Diagnosis present

## 2017-01-15 DIAGNOSIS — Z87891 Personal history of nicotine dependence: Secondary | ICD-10-CM

## 2017-01-15 DIAGNOSIS — Z85038 Personal history of other malignant neoplasm of large intestine: Secondary | ICD-10-CM

## 2017-01-15 DIAGNOSIS — Z923 Personal history of irradiation: Secondary | ICD-10-CM

## 2017-01-15 DIAGNOSIS — Z4659 Encounter for fitting and adjustment of other gastrointestinal appliance and device: Secondary | ICD-10-CM

## 2017-01-15 DIAGNOSIS — Z823 Family history of stroke: Secondary | ICD-10-CM

## 2017-01-15 LAB — URINALYSIS, COMPLETE (UACMP) WITH MICROSCOPIC
BACTERIA UA: NONE SEEN
BILIRUBIN URINE: NEGATIVE
Glucose, UA: NEGATIVE mg/dL
Ketones, ur: NEGATIVE mg/dL
NITRITE: NEGATIVE
PROTEIN: NEGATIVE mg/dL
Specific Gravity, Urine: 1.002 — ABNORMAL LOW (ref 1.005–1.030)
pH: 7 (ref 5.0–8.0)

## 2017-01-15 LAB — COMPREHENSIVE METABOLIC PANEL
ALBUMIN: 4.2 g/dL (ref 3.5–5.0)
ALT: 14 U/L (ref 14–54)
ANION GAP: 6 (ref 5–15)
AST: 21 U/L (ref 15–41)
Alkaline Phosphatase: 31 U/L — ABNORMAL LOW (ref 38–126)
BUN: 17 mg/dL (ref 6–20)
CHLORIDE: 102 mmol/L (ref 101–111)
CO2: 26 mmol/L (ref 22–32)
Calcium: 9.2 mg/dL (ref 8.9–10.3)
Creatinine, Ser: 0.73 mg/dL (ref 0.44–1.00)
GFR calc Af Amer: >60 mL/min (ref >60)
Glucose, Bld: 162 mg/dL — ABNORMAL HIGH (ref 65–99)
POTASSIUM: 3.9 mmol/L (ref 3.5–5.1)
Sodium: 134 mmol/L — ABNORMAL LOW (ref 135–145)
Total Bilirubin: 0.9 mg/dL (ref 0.3–1.2)
Total Protein: 7.1 g/dL (ref 6.5–8.1)

## 2017-01-15 LAB — APTT: aPTT: 33 seconds (ref 24–36)

## 2017-01-15 LAB — PROTIME-INR
INR: 1.17
PROTHROMBIN TIME: 15 s (ref 11.4–15.2)

## 2017-01-15 LAB — CBC
HEMATOCRIT: 42.2 % (ref 35.0–47.0)
HEMOGLOBIN: 14.1 g/dL (ref 12.0–16.0)
MCH: 29.5 pg (ref 26.0–34.0)
MCHC: 33.5 g/dL (ref 32.0–36.0)
MCV: 88.2 fL (ref 80.0–100.0)
Platelets: 283 10*3/uL (ref 150–440)
RBC: 4.78 MIL/uL (ref 3.80–5.20)
RDW: 13.6 % (ref 11.5–14.5)
WBC: 6.3 10*3/uL (ref 3.6–11.0)

## 2017-01-15 LAB — LIPASE, BLOOD: LIPASE: 27 U/L (ref 11–51)

## 2017-01-15 LAB — TROPONIN I

## 2017-01-15 MED ORDER — IOPAMIDOL (ISOVUE-300) INJECTION 61%
30.0000 mL | Freq: Once | INTRAVENOUS | Status: AC | PRN
  Administered 2017-01-15: 30 mL via ORAL

## 2017-01-15 MED ORDER — SODIUM CHLORIDE 0.9 % IV BOLUS (SEPSIS)
1000.0000 mL | Freq: Once | INTRAVENOUS | Status: AC
Start: 2017-01-15 — End: 2017-01-16
  Administered 2017-01-15: 1000 mL via INTRAVENOUS

## 2017-01-15 MED ORDER — IOPAMIDOL (ISOVUE-300) INJECTION 61%
100.0000 mL | Freq: Once | INTRAVENOUS | Status: AC | PRN
  Administered 2017-01-15: 100 mL via INTRAVENOUS

## 2017-01-15 NOTE — ED Notes (Signed)
ngtube inserted into left nares without diff   Pt tolerated well.  Family at bedside.

## 2017-01-15 NOTE — ED Triage Notes (Signed)
Patient ambulatory to triage with steady gait, without difficulty or distress noted; pt reports x 4 months having lower abd pain with no accomp symptoms, referred to Dr Vira Agar and has appt 4/10

## 2017-01-15 NOTE — ED Provider Notes (Signed)
[facility] Emergency Department Provider Note   ____________________________________________   First MD Initiated Contact with Patient 01/15/17 1931     (approximate)  I have reviewed the triage vital signs and the nursing notes.   HISTORY  Chief Complaint Abdominal Pain   HPI Lisa Hayden is a 83 y.o. female who complains of 4 months of worsening abdominal pain and feeling of rolling sensation and grinding is getting worse. Occasional nausea and no diarrhea and occasional vomiting but not much. Patient has a 3 page typed list of symptoms times an days that occurred on which she gave him a copy of.   Past Medical History:  Diagnosis Date  . Asthma   . Cancer ([place])   . Hypertension   . Osteoporosis     Patient Active Problem List   Diagnosis Date Noted  . Malignant neoplasm of colon ([place]) 05/08/2016  . Reactive airway disease 05/04/2015  . Bronchitis 05/04/2015  . Asthma without status asthmaticus 05/03/2015  . Back ache 05/03/2015  . Body mass index (BMI) of 24.0-24.9 in adult 05/03/2015  . Breast CA ([place]) 05/03/2015  . Blood pressure elevated 05/03/2015  . Foot pain 05/03/2015  . BP (high blood pressure) 05/03/2015  . Leg pain 05/03/2015  . Lichen sclerosus of female genitalia 05/03/2015  . Neutropenia ([place]) 05/03/2015  . Tendinitis of wrist 05/03/2015  . Atrophy of vagina 05/03/2015  . Avitaminosis D 05/03/2015  . CN (constipation) 01/11/2010  . Mechanical and motor problems with internal organs 09/21/2009  . Arthralgia of shoulder 08/31/2009  . Personal history of malignant neoplasm of breast 04/13/2009  . OP (osteoporosis) 04/13/2009    Past Surgical History:  Procedure Laterality Date  . APPENDECTOMY  1953  . BREAST LUMPECTOMY Left 10/29/94   negative nodes. Treated with radiation only  . BREAST SURGERY    . BUNIONECTOMY  1979  . CATARACT EXTRACTION, BILATERAL     left 07/29/2012; right 06/24/2012  . COLON SURGERY   05/1991   secondary to cancer   . ROTATOR CUFF REPAIR Right 01/25/10  . TONSILLECTOMY  1938   and adenoidectomy    Prior to Admission medications   Medication Sig Start Date End Date Taking? Authorizing Provider  alendronate (FOSAMAX) 70 MG tablet Take 1 tablet (70 mg total) by mouth once a week. On Sunday 11/07/16  Yes Birdie Sons, MD  aspirin 81 MG chewable tablet Chew 81 mg by mouth daily.   Yes Historical Provider, MD  cholecalciferol (VITAMIN D) 1000 UNITS tablet Take 1,000 Units by mouth every evening. With dinner   Yes Historical Provider, MD    Allergies Actonel [risedronate sodium]; Cortisone; Daypro  [oxaprozin]; and Meloxicam  Family History  Problem Relation Age of Onset  . CVA Mother   . Congestive Heart Failure Mother   . Diabetes Sister   . Asthma Father     Social History Social History  Substance Use Topics  . Smoking status: Former Research scientist (life sciences)  . Smokeless tobacco: Never Used  . Alcohol use 4.2 oz/week    7 Standard drinks or equivalent per week    Review of Systems Constitutional: No fever/chills Eyes: No visual changes. ENT: No sore throat. Cardiovascular: Denies chest pain. Respiratory: Denies shortness of breath. Gastrointestinal: See history of present illness Genitourinary: Negative for dysuria. Musculoskeletal: Negative for back pain. Skin: Negative for rash. Neurological: Negative for headaches, focal weakness or numbness.  10-point ROS otherwise negative.  ____________________________________________   PHYSICAL EXAM:  VITAL SIGNS: ED  Triage Vitals  Enc Vitals Group     BP 01/15/17 1922 (!) 175/113     Pulse Rate 01/15/17 1922 (!) 119     Resp 01/15/17 1922 18     Temp 01/15/17 1922 98.4 F (36.9 C)     Temp Source 01/15/17 1922 Oral     SpO2 01/15/17 1922 98 %     Weight 01/15/17 1921 126 lb (57.2 kg)     Height 01/15/17 1921 5\' 2"  (1.575 m)     Head Circumference --      Peak Flow --      Pain Score 01/15/17 1921 7     Pain  Loc --      Pain Edu? --      Excl. in [place]? --     Constitutional: Alert and oriented. Well appearing and in no acute distress. Eyes: Conjunctivae are normal. PERRL. EOMI. Head: Atraumatic. Nose: No congestion/rhinnorhea. Mouth/Throat: Mucous membranes are moist.  Oropharynx non-erythematous. Neck: No stridor.  Cardiovascular: Normal rate, regular rhythm. Grossly normal heart sounds.  Good peripheral circulation. Respiratory: Normal respiratory effort.  No retractions. Lungs CTAB. Gastrointestinal: Soft and nontender. No distention. No abdominal bruits. No CVA tenderness. Musculoskeletal: No lower extremity tenderness nor edema.  No joint effusions. Neurologic:  Normal speech and language. No gross focal neurologic deficits are appreciated. No gait instability. Skin:  Skin is warm, dry and intact. No rash noted.   ____________________________________________   LABS (all labs ordered are listed, but only abnormal results are displayed)  Labs Reviewed  COMPREHENSIVE METABOLIC PANEL - Abnormal; Notable for the following:       Result Value   Sodium 134 (*)    Glucose, Bld 162 (*)    Alkaline Phosphatase 31 (*)    All other components within normal limits  URINALYSIS, COMPLETE (UACMP) WITH MICROSCOPIC - Abnormal; Notable for the following:    Color, Urine COLORLESS (*)    APPearance CLEAR (*)    Specific Gravity, Urine 1.002 (*)    Hgb urine dipstick SMALL (*)    Leukocytes, UA TRACE (*)    Squamous Epithelial / LPF 0-5 (*)    All other components within normal limits  LIPASE, BLOOD  CBC  TROPONIN I   ____________________________________________  EKG  EKG read and interpreted by me shows normal sinus rhythm rate of 83 normal axis nonspecific ST-T wave changes better than previous EKG on 05/04/2015. ____________________________________________  RADIOLOGY Study Result   CLINICAL DATA:  Lower abdominal pain for 4 months.  EXAM: CT ABDOMEN AND PELVIS WITH  CONTRAST  TECHNIQUE: Multidetector CT imaging of the abdomen and pelvis was performed using the standard protocol following bolus administration of intravenous contrast.  CONTRAST:  132mL ISOVUE-300 IOPAMIDOL (ISOVUE-300) INJECTION 61%  COMPARISON:  CT scan of October 25, 2009.  FINDINGS: Lower chest: No acute abnormality.  Hepatobiliary: Cholelithiasis is noted. No focal abnormality is noted in the liver.  Pancreas: Unremarkable. No pancreatic ductal dilatation or surrounding inflammatory changes.  Spleen: Normal in size without focal abnormality.  Adrenals/Urinary Tract: Adrenal glands are unremarkable. Kidneys are normal, without renal calculi, focal lesion, or hydronephrosis. Bladder is unremarkable.  Stomach/Bowel: Distal small bowel obstruction is noted with transition zone seen in the pelvis, best seen on image number 54 of series 2. Adjacent soft tissue density measuring 15 x 14 mm is noted concerning for peritoneal implant. Small sliding-type hiatal hernia is noted.  Vascular/Lymphatic: Atherosclerosis of abdominal aorta is noted without aneurysm or dissection. 1 cm left periaortic  lymph node is noted.  Reproductive: Uterus is unremarkable. Ovaries are not well visualized.  Other: Abnormal soft tissue density is seen involving the omentum in left lower quadrant consistent with peritoneal carcinomatosis. Small amount of free fluid is noted in dependent portion of pelvis. 19 x 16 mm soft tissue density is noted in left posterior pelvis concerning for peritoneal implant.  Musculoskeletal: No acute or significant osseous findings.  IMPRESSION: Cholelithiasis.  Distal small bowel obstruction is noted with transition zone seen in the pelvis, most likely due to adjacent peritoneal implant or metastatic disease. Omental caking is seen in left lower quadrant, with another peritoneal implant seen in left posterior pelvis. This is concerning for  peritoneal carcinomatosis.  Aortic atherosclerosis.  Small sliding-type hiatal hernia.   Electronically Signed   By: Marijo Conception, M.D.   On: 01/15/2017 21:29      ____________________________________________   PROCEDURES  Procedure(s) performed:  Procedures  Critical Care performed:  ____________________________________________   INITIAL IMPRESSION / ASSESSMENT AND PLAN / ED COURSE  Pertinent labs & imaging results that were available during my care of the patient were reviewed by me and considered in my medical decision making (see chart for details).        ____________________________________________   FINAL CLINICAL IMPRESSION(S) / ED DIAGNOSES  Final diagnoses:  Small bowel obstruction      NEW MEDICATIONS STARTED DURING THIS VISIT:  New Prescriptions   No medications on file     Note:  This document was prepared using Dragon voice recognition software and may include unintentional dictation errors.    Nena Polio, MD 01/15/17 2137

## 2017-01-15 NOTE — ED Notes (Signed)
Pt has finished contrast. CT has been called.

## 2017-01-15 NOTE — ED Notes (Signed)
Pt up to toilet in room with no difficulty. Pt ambulatory independently. No dizziness or lightheadedness reported.

## 2017-01-15 NOTE — ED Notes (Addendum)
Pt had episode of nausea and dry heaving. Pt became tachycardic at this time. Pt verbalized feeling nauseous but denies feeling as though her heart is racing. Pt is having no SOB or difficulty breathing and no chest pain at this time. Admitting MD at bedside with pt at this time.

## 2017-01-15 NOTE — Consult Note (Signed)
Patient ID: Lisa Hayden, female   DOB: 01/03/1930, 83 y.o.   MRN: [id number]  HPI Lisa Hayden is a 83 y.o. female with a three-month history of intermittent abdominal discomfort and nausea. She states that her last couple weeks her symptoms have worsened and special the pain and the nausea and vomiting. She describes her pain as a moderate in intensity, sharp in nature and no specific alleviating or aggravating factors. Patient has had decreased appetite and decreased by mouth intake. She is passing some flatus. No fevers or chills. She does have as his stated bloody vaginal discharge for a few months. Apparently went to Dr. Leafy Ro and a recent transvaginal ultrasound was normal as per patient report.  Her previous surgical history included a sigmoid colectomy for colon cancer about 20 years ago. She did not require any chemotherapy for that. She also had a left lumpectomy for breast cancer and adjuvant radiation therapy for her breast. She is 10 but she is very functional and is able to perform more than 4 Mets without shortness of breath or chest pain. Workup has included a CT scan of the abdomen and pelvis that I have personally reviewed. There is evidence of small bowel dilation and there is a transition area down in the pelvis. There is multiple areas of peritoneal implants in the pelvis and also in the omentum. There are no definitive adnexal masses that I can see. No evidence of free air no evidence of pneumatosis. No Evidence of bowel ischemia  HPI  Past Medical History:  Diagnosis Date  . Asthma   . Cancer ([place])   . Hypertension   . Osteoporosis     Past Surgical History:  Procedure Laterality Date  . APPENDECTOMY  1953  . BREAST LUMPECTOMY Left 10/29/94   negative nodes. Treated with radiation only  . BREAST SURGERY    . BUNIONECTOMY  1979  . CATARACT EXTRACTION, BILATERAL     left 07/29/2012; right 06/24/2012  . COLON SURGERY  05/1991   secondary to cancer   . ROTATOR  CUFF REPAIR Right 01/25/10  . TONSILLECTOMY  1938   and adenoidectomy    Family History  Problem Relation Age of Onset  . CVA Mother   . Congestive Heart Failure Mother   . Diabetes Sister   . Asthma Father     Social History Social History  Substance Use Topics  . Smoking status: Former Research scientist (life sciences)  . Smokeless tobacco: Never Used  . Alcohol use 4.2 oz/week    7 Standard drinks or equivalent per week    Allergies  Allergen Reactions  . Actonel [Risedronate Sodium] Other (See Comments)    headache  . Cortisone     very alert and did not sleep for 24 hrs  . Daypro  [Oxaprozin]   . Meloxicam     Current Facility-Administered Medications  Medication Dose Route Frequency Provider Last Rate Last Dose  . sodium chloride 0.9 % bolus 1,000 mL  1,000 mL Intravenous Once Jules Husbands, MD       Current Outpatient Prescriptions  Medication Sig Dispense Refill  . alendronate (FOSAMAX) 70 MG tablet Take 1 tablet (70 mg total) by mouth once a week. On Sunday 12 tablet 4  . aspirin 81 MG chewable tablet Chew 81 mg by mouth daily.    . cholecalciferol (VITAMIN D) 1000 UNITS tablet Take 1,000 Units by mouth every evening. With dinner       Review of Systems A 10 point review  of systems was asked and was negative except for the information on the HPI  Physical Exam Blood pressure (!) 166/96, pulse (!) 114, temperature 98.4 F (36.9 C), temperature source Oral, resp. rate 16, height 5\' 2"  (1.575 m), weight 57.2 kg (126 lb), SpO2 100 %. CONSTITUTIONAL: Elderly female in NAD. EYES: Pupils are equal, round, and reactive to light, Sclera are non-icteric. EARS, NOSE, MOUTH AND THROAT: The oropharynx is clear. The oral mucosa is pink and moist. Hearing is intact to voice. LYMPH NODES:  Lymph nodes in the neck are normal. RESPIRATORY:  Lungs are clear. There is normal respiratory effort, with equal breath sounds bilaterally, and without pathologic use of accessory muscles. CARDIOVASCULAR: Heart  is regular without murmurs, gallops, or rubs. GI: The abdomen is soft, decreased bowel sounds. Abdomen is distended with mild tenderness to palpation but no evidence of peritonitis or rebound tenderness.  GU: Rectal deferred.   MUSCULOSKELETAL: Normal muscle strength and tone. No cyanosis or edema.   SKIN: Turgor is good and there are no pathologic skin lesions or ulcers. NEUROLOGIC: Motor and sensation is grossly normal. Cranial nerves are grossly intact. PSYCH:  Oriented to person, place and time. Affect is normal.  Data Reviewed  I have personally reviewed the patient's imaging, laboratory findings and medical records.    Assessment/ Plan   83 year old female presented with small bowel obstruction related to peritoneal carcinomatosis. Question becomes the primary organ responsible for her mets. It could be related to her colon cancer but more than likely given her presentation might be related to an actual ovarian malignancy. Peritoneal carcinomatosis in the setting of a small bowel obstruction poses a very difficult surgical challenge. I think that the first order of business is to work herup and stage her if at all possible. I think that it would be beneficial to get oncology input as well as gynecology improving. Far as the small bowel obstruction will start with conservative management in the form of IV fluids, nothing by mouth and NG tube. If this does not improve may need surgical attention. Unfortunately surgical intervention carries a significant morbidity and even mortality in patients with metastatic disease and peritoneal carcinomatosis. Sometimes the abdomen is frozen and its a futile laparotomy. I do think that the patient needs to be worked up And try to be staged so we have a better idea of her long-term prognosis. Then, she  May be able to make a more educated decision regarding further therapy. I will order CEA, CA 19-9 and CA 125. Sensitive counseling provided and case discussed  in detail with Dr. Rip Harbour from the emergency room NO surgical intervention required at this time, recommend admission to the hospitalist service for further w/u and coordination of her disease process  Caroleen Hamman, MD [place] [facility] 01/15/2017, 10:44 PM

## 2017-01-16 DIAGNOSIS — Z923 Personal history of irradiation: Secondary | ICD-10-CM | POA: Diagnosis not present

## 2017-01-16 DIAGNOSIS — Z833 Family history of diabetes mellitus: Secondary | ICD-10-CM | POA: Diagnosis not present

## 2017-01-16 DIAGNOSIS — Z79899 Other long term (current) drug therapy: Secondary | ICD-10-CM

## 2017-01-16 DIAGNOSIS — K56609 Unspecified intestinal obstruction, unspecified as to partial versus complete obstruction: Secondary | ICD-10-CM | POA: Diagnosis not present

## 2017-01-16 DIAGNOSIS — Z87891 Personal history of nicotine dependence: Secondary | ICD-10-CM

## 2017-01-16 DIAGNOSIS — M810 Age-related osteoporosis without current pathological fracture: Secondary | ICD-10-CM

## 2017-01-16 DIAGNOSIS — Z85038 Personal history of other malignant neoplasm of large intestine: Secondary | ICD-10-CM | POA: Diagnosis not present

## 2017-01-16 DIAGNOSIS — R110 Nausea: Secondary | ICD-10-CM | POA: Diagnosis not present

## 2017-01-16 DIAGNOSIS — K56699 Other intestinal obstruction unspecified as to partial versus complete obstruction: Secondary | ICD-10-CM | POA: Diagnosis not present

## 2017-01-16 DIAGNOSIS — C801 Malignant (primary) neoplasm, unspecified: Secondary | ICD-10-CM

## 2017-01-16 DIAGNOSIS — Z853 Personal history of malignant neoplasm of breast: Secondary | ICD-10-CM

## 2017-01-16 DIAGNOSIS — I10 Essential (primary) hypertension: Secondary | ICD-10-CM

## 2017-01-16 DIAGNOSIS — J45909 Unspecified asthma, uncomplicated: Secondary | ICD-10-CM | POA: Diagnosis present

## 2017-01-16 DIAGNOSIS — Z978 Presence of other specified devices: Secondary | ICD-10-CM

## 2017-01-16 DIAGNOSIS — Z7982 Long term (current) use of aspirin: Secondary | ICD-10-CM | POA: Diagnosis not present

## 2017-01-16 DIAGNOSIS — C786 Secondary malignant neoplasm of retroperitoneum and peritoneum: Secondary | ICD-10-CM | POA: Diagnosis present

## 2017-01-16 DIAGNOSIS — Z825 Family history of asthma and other chronic lower respiratory diseases: Secondary | ICD-10-CM | POA: Diagnosis not present

## 2017-01-16 DIAGNOSIS — R109 Unspecified abdominal pain: Secondary | ICD-10-CM

## 2017-01-16 DIAGNOSIS — Z823 Family history of stroke: Secondary | ICD-10-CM | POA: Diagnosis not present

## 2017-01-16 DIAGNOSIS — Z8249 Family history of ischemic heart disease and other diseases of the circulatory system: Secondary | ICD-10-CM | POA: Diagnosis not present

## 2017-01-16 DIAGNOSIS — Z7983 Long term (current) use of bisphosphonates: Secondary | ICD-10-CM | POA: Diagnosis not present

## 2017-01-16 LAB — CBC
HEMATOCRIT: 38.7 % (ref 35.0–47.0)
Hemoglobin: 13.5 g/dL (ref 12.0–16.0)
MCH: 30.4 pg (ref 26.0–34.0)
MCHC: 34.8 g/dL (ref 32.0–36.0)
MCV: 87.2 fL (ref 80.0–100.0)
PLATELETS: 270 10*3/uL (ref 150–440)
RBC: 4.44 MIL/uL (ref 3.80–5.20)
RDW: 13.4 % (ref 11.5–14.5)
WBC: 9.9 10*3/uL (ref 3.6–11.0)

## 2017-01-16 LAB — BASIC METABOLIC PANEL
Anion gap: 9 (ref 5–15)
BUN: 14 mg/dL (ref 6–20)
CO2: 25 mmol/L (ref 22–32)
Calcium: 8.5 mg/dL — ABNORMAL LOW (ref 8.9–10.3)
Chloride: 105 mmol/L (ref 101–111)
Creatinine, Ser: 0.5 mg/dL (ref 0.44–1.00)
GFR calc Af Amer: >60 mL/min (ref >60)
Glucose, Bld: 132 mg/dL — ABNORMAL HIGH (ref 65–99)
POTASSIUM: 3.9 mmol/L (ref 3.5–5.1)
Sodium: 139 mmol/L (ref 135–145)

## 2017-01-16 MED ORDER — MORPHINE SULFATE (PF) 2 MG/ML IV SOLN
2.0000 mg | INTRAVENOUS | Status: DC | PRN
  Administered 2017-01-16: 2 mg via INTRAVENOUS
  Filled 2017-01-16: qty 1

## 2017-01-16 MED ORDER — ENOXAPARIN SODIUM 40 MG/0.4ML ~~LOC~~ SOLN
40.0000 mg | Freq: Every day | SUBCUTANEOUS | Status: DC
  Administered 2017-01-16 – 2017-01-21 (×6): 40 mg via SUBCUTANEOUS
  Filled 2017-01-16 (×6): qty 0.4

## 2017-01-16 MED ORDER — ONDANSETRON HCL 4 MG/2ML IJ SOLN
4.0000 mg | Freq: Four times a day (QID) | INTRAMUSCULAR | Status: DC | PRN
  Administered 2017-01-16: 4 mg via INTRAVENOUS

## 2017-01-16 MED ORDER — ACETAMINOPHEN 325 MG PO TABS
650.0000 mg | ORAL_TABLET | Freq: Four times a day (QID) | ORAL | Status: DC | PRN
Start: 2017-01-16 — End: 2017-01-22
  Administered 2017-01-18 – 2017-01-21 (×3): 650 mg via ORAL
  Filled 2017-01-16 (×3): qty 2

## 2017-01-16 MED ORDER — MORPHINE SULFATE (PF) 2 MG/ML IV SOLN
2.0000 mg | INTRAVENOUS | Status: DC | PRN
  Administered 2017-01-16 (×2): 2 mg via INTRAVENOUS
  Filled 2017-01-16 (×2): qty 1

## 2017-01-16 MED ORDER — MORPHINE SULFATE (PF) 2 MG/ML IV SOLN
2.0000 mg | INTRAVENOUS | Status: DC | PRN
  Administered 2017-01-18 – 2017-01-20 (×4): 2 mg via INTRAVENOUS
  Filled 2017-01-16 (×4): qty 1

## 2017-01-16 MED ORDER — ACETAMINOPHEN 650 MG RE SUPP: 650.0000 mg | Freq: Four times a day (QID) | RECTAL | Status: DC | PRN

## 2017-01-16 MED ORDER — ONDANSETRON HCL 4 MG/2ML IJ SOLN
INTRAMUSCULAR | Status: AC
  Administered 2017-01-16: 4 mg via INTRAVENOUS
  Filled 2017-01-16: qty 2

## 2017-01-16 MED ORDER — ONDANSETRON HCL 4 MG PO TABS: 4.0000 mg | ORAL_TABLET | Freq: Four times a day (QID) | ORAL | Status: DC | PRN

## 2017-01-16 MED ORDER — ONDANSETRON HCL 4 MG/2ML IJ SOLN
4.0000 mg | Freq: Four times a day (QID) | INTRAMUSCULAR | Status: DC | PRN
  Administered 2017-01-18 – 2017-01-21 (×7): 4 mg via INTRAVENOUS
  Filled 2017-01-16 (×8): qty 2

## 2017-01-16 MED ORDER — SODIUM CHLORIDE 0.9 % IV SOLN
INTRAVENOUS | Status: AC
  Administered 2017-01-16 (×2): via INTRAVENOUS

## 2017-01-16 MED ORDER — LABETALOL HCL 5 MG/ML IV SOLN
10.0000 mg | INTRAVENOUS | Status: DC | PRN
  Administered 2017-01-16: 10 mg via INTRAVENOUS
  Administered 2017-01-21: 5 mg via INTRAVENOUS
  Filled 2017-01-16 (×2): qty 4

## 2017-01-16 NOTE — Progress Notes (Signed)
Initial Nutrition Assessment  DOCUMENTATION CODES:   Severe malnutrition in context of acute illness/injury  INTERVENTION:  Diet advancement per Surgery/MD.  Will provide appropriate oral nutrition supplement as diet advanced. Recommend Ensure Enlive po TID, each supplement provides 350 kcal and 20 grams of protein.  If patient remains NPO and unable to advance diet, would recommend consideration of TPN by day 7-10 of no oral intake (3/6-3/9).  NUTRITION DIAGNOSIS:   Inadequate oral intake related to inability to eat as evidenced by NPO status.  GOAL:   Patient will meet greater than or equal to 90% of their needs  MONITOR:   Diet advancement, Labs, Weight trends, I & O's  REASON FOR ASSESSMENT:   Malnutrition Screening Tool    ASSESSMENT:   83 year old female with PMHx of HTN, colon cancer s/p sigmoid colectomy in 1992, breast cancer s/p left lumpectomy and adjuvant radiation therapy in 1995,  presents with three-month history of intermittent abdominal discomfort and nausea found to have small bowel obstruction related to peritoneal carcinomatosis.    -Patient pending oncology and gynecology consults.  Spoke with patient and husband at bedside. Patient reports that in mid December 2017 she began experiencing abdominal pain, nausea, and occasional vomiting post-prandially. She reports her appetite has been decreased and she is eating less than normal. She has been having oatmeal for breakfast (reports occasionally when feeling well she has had a "full" breakfast with eggs). She skips lunch and will occasionally eat dinner (1/2 cup tomato soup, 1/2 yogurt, 2 crackers), but reports she may vomit after dinner. She also has yogurt daily if not at dinner but does not always finish the entire container. Her last meal was the day of admission (2/27). Her prior intake was 2 meals per day, but patient reports she was eating more at these meals.   Patient reports UBW 130 lbs and she has  not lost much weight so far. Per chart patient has lost 9 lbs (7.1% body weight) over 2 months, which is significant for time frame.  I/O: Per chart patient with 300 ml output NGT and 300 ml emesis past 24 hrs. At time of RD assessment patient with 400 ml output from NGT.  Medications reviewed and include: NS @ 75 ml/hr, Zofran 4 mg Q6hrs PRN.  Labs reviewed: Potassium WNL. No others pertinent.  Nutrition-Focused physical exam completed. Findings are no fat depletion, mild-moderate muscle depletion in temples and dorsal hand, and no edema.   Diet Order:  Diet NPO time specified  Skin:  Reviewed, no issues  Last BM:  01/15/2017  Height:   Ht Readings from Last 1 Encounters:  01/15/17 5\' 2"  (1.575 m)    Weight:   Wt Readings from Last 1 Encounters:  01/15/17 126 lb (57.2 kg)    Ideal Body Weight:  50 kg  BMI:  Body mass index is 23.05 kg/m.  Estimated Nutritional Needs:   Kcal:  1715-2000 (30-35 kcal/kg)  Protein:  70-85 grams (1.2-1.5 grams/kg)  Fluid:  1.4 L/day (25 ml/kg)  EDUCATION NEEDS:   No education needs identified at this time  Willey Blade, MS, RD, LDN Pager: [phone] After Hours Pager: [phone]

## 2017-01-16 NOTE — Consult Note (Signed)
[place]  [phone] [phone]  ID: Lisa Hayden OB: 11-Sep-1930  MR#: [id number]  [id number]  Patient Care Team: Birdie Sons, MD as PCP - General (Family Medicine) Clent Jacks, RN as Registered Nurse   CHIEF COMPLAINT: Abdominal pain, peritoneal carcinomatosis  INTERVAL HISTORY: Patient is a 83 year old female who complains of abdominal pain and nausea for the past two months. Upon evaluation in the emergency room, she was noted to have a partial small bowel obstruction likely secondary to peritoneal carcinomatosis.  She currently feels improved, but still has an NG tube in place.  She has no neurologic complaints.  She denies any recent fevers. She has a fair appetite, but denies weight loss.  She has no chest pain or shortness of breath.  She has no urinary complaints.  She offers no further specific complaints.  REVIEW OF SYSTEMS:   Review of Systems  Constitutional: Negative.  Negative for fever, malaise/fatigue and weight loss.  Respiratory: Negative.  Negative for cough and shortness of breath.   Cardiovascular: Negative.  Negative for chest pain and leg swelling.  Gastrointestinal: Positive for abdominal pain and nausea. Negative for blood in stool, constipation, diarrhea and melena.  Genitourinary: Negative.   Musculoskeletal: Negative.   Neurological: Negative.  Negative for weakness.  Psychiatric/Behavioral: Negative.  The patient is not nervous/anxious.     As per HPI. Otherwise, a complete review of systems is negative.  PAST MEDICAL HISTORY: Past Medical History:  Diagnosis Date  . Asthma   . Cancer ([place])   . Hypertension   . Osteoporosis     PAST SURGICAL HISTORY: Past Surgical History:  Procedure Laterality Date  . APPENDECTOMY  1953  . BREAST LUMPECTOMY Left 10/29/94   negative nodes. Treated with radiation only  . BREAST SURGERY    . BUNIONECTOMY  1979  . CATARACT EXTRACTION, BILATERAL     left 07/29/2012; right 06/24/2012  . COLON SURGERY  05/1991   secondary to cancer   . ROTATOR CUFF REPAIR Right 01/25/10  . TONSILLECTOMY  1938   and adenoidectomy    FAMILY HISTORY: Family History  Problem Relation Age of Onset  . CVA Mother   . Congestive Heart Failure Mother   . Diabetes Sister   . Asthma Father     ADVANCED DIRECTIVES (Y/N):  @ADVDIR @  HEALTH MAINTENANCE: Social History  Substance Use Topics  . Smoking status: Former Research scientist (life sciences)  . Smokeless tobacco: Never Used  . Alcohol use 4.2 oz/week    7 Standard drinks or equivalent per week     Colonoscopy:  PAP:  Bone density:  Lipid panel:  Allergies  Allergen Reactions  . Actonel [Risedronate Sodium] Other (See Comments)    headache  . Cortisone     very alert and did not sleep for 24 hrs  . Daypro  [Oxaprozin]   . Meloxicam     Current Facility-Administered Medications  Medication Dose Route Frequency Provider Last Rate Last Dose  . acetaminophen (TYLENOL) tablet 650 mg  650 mg Oral Q6H PRN Lance Coon, MD       Or  . acetaminophen (TYLENOL) suppository 650 mg  650 mg Rectal Q6H PRN Lance Coon, MD      . enoxaparin (LOVENOX) injection 40 mg  40 mg Subcutaneous Daily Lance Coon, MD   40 mg at 01/16/17 0956  . labetalol (NORMODYNE,TRANDATE) injection 10 mg  10 mg Intravenous Q2H PRN Lance Coon, MD   10 mg at 01/16/17 0125  .  morphine 2 MG/ML injection 2 mg  2 mg Intravenous Q4H PRN Dustin Flock, MD      . ondansetron (ZOFRAN) tablet 4 mg  4 mg Oral Q6H PRN Lance Coon, MD       Or  . ondansetron [facility]) injection 4 mg  4 mg Intravenous Q6H PRN Lance Coon, MD        OBJECTIVE: Vitals:   01/16/17 1253 01/16/17 2121  BP: 137/60 (!) 130/56  Pulse: 76 73  Resp: 20 18  Temp: 98.3 F (36.8 C) 98.3 F (36.8 C)     Body mass index is 23.05 kg/m.    ECOG FS:0 - Asymptomatic  General: Well-developed, well-nourished, no acute distress. Eyes: Pink conjunctiva, anicteric sclera. HEENT:  Normocephalic, moist mucous membranes, clear oropharnyx.  NG tube in place. Lungs: Clear to auscultation bilaterally. Heart: Regular rate and rhythm. No rubs, murmurs, or gallops. Abdomen: Soft, nontender, nondistended. No organomegaly noted, normoactive bowel sounds. Musculoskeletal: No edema, cyanosis, or clubbing. Neuro: Alert, answering all questions appropriately. Cranial nerves grossly intact. Skin: No rashes or petechiae noted. Psych: Normal affect.   LAB RESULTS:  Lab Results  Component Value Date   NA 139 01/16/2017   K 3.9 01/16/2017   CL 105 01/16/2017   CO2 25 01/16/2017   GLUCOSE 132 (H) 01/16/2017   BUN 14 01/16/2017   CREATININE 0.50 01/16/2017   CALCIUM 8.5 (L) 01/16/2017   PROT 7.1 01/15/2017   ALBUMIN 4.2 01/15/2017   AST 21 01/15/2017   ALT 14 01/15/2017   ALKPHOS 31 (L) 01/15/2017   BILITOT 0.9 01/15/2017   GFRNONAA >60 01/16/2017   GFRAA >60 01/16/2017    Lab Results  Component Value Date   WBC 9.9 01/16/2017   NEUTROABS 2.3 05/09/2016   HGB 13.5 01/16/2017   HCT 38.7 01/16/2017   MCV 87.2 01/16/2017   PLT 270 01/16/2017   No results found for: CA125   STUDIES: Dg Chest 2 View  Result Date: 01/15/2017 CLINICAL DATA:  Umbilical abdominal pain. Vomiting x4 since December. Asthma. Former smoker. EXAM: CHEST  2 VIEW COMPARISON:  05/02/2015 CXR FINDINGS: The heart size and mediastinal contours are within normal limits. Aortic atherosclerosis without aneurysm. The lungs are hyperinflated consistent with history of asthma. No pulmonary consolidation, effusion or pneumothorax. No overt pulmonary edema. Axillary clips are seen on the left. Old fracture deformity noted of the right seventh rib. Chronic anterior wedge fracture of T3. IMPRESSION: 1. Hyperinflated lungs.  No acute pulmonary disease. 2. Aortic atherosclerosis. 3. Chronic T3 vertebral body compression. Old right seventh rib fracture. Electronically Signed   By: Ashley Royalty M.D.   On: 01/15/2017  20:25   Dg Abd 1 View  Result Date: 01/15/2017 CLINICAL DATA:  Nasogastric tube placement.  Initial encounter. EXAM: ABDOMEN - 1 VIEW COMPARISON:  CT of the abdomen and pelvis performed earlier today at 8:56 p.m. FINDINGS: The patient's enteric tube is noted ending overlying the body of the stomach. The visualized bowel gas pattern is unremarkable. Residual contrast is seen within small bowel loops. Previously noted small bowel obstruction is difficult to assess on this image. No free intra-abdominal air is identified, though evaluation for free air is limited on a single supine view. The visualized osseous structures are within normal limits; the sacroiliac joints are unremarkable in appearance. The visualized lung bases are essentially clear. IMPRESSION: Enteric tube noted ending overlying the body of the stomach. Electronically Signed   By: Garald Balding M.D.   On: 01/15/2017 23:57  Ct Abdomen Pelvis W Contrast  Result Date: 01/15/2017 CLINICAL DATA:  Lower abdominal pain for 4 months. EXAM: CT ABDOMEN AND PELVIS WITH CONTRAST TECHNIQUE: Multidetector CT imaging of the abdomen and pelvis was performed using the standard protocol following bolus administration of intravenous contrast. CONTRAST:  135mL ISOVUE-300 IOPAMIDOL (ISOVUE-300) INJECTION 61% COMPARISON:  CT scan of October 25, 2009. FINDINGS: Lower chest: No acute abnormality. Hepatobiliary: Cholelithiasis is noted. No focal abnormality is noted in the liver. Pancreas: Unremarkable. No pancreatic ductal dilatation or surrounding inflammatory changes. Spleen: Normal in size without focal abnormality. Adrenals/Urinary Tract: Adrenal glands are unremarkable. Kidneys are normal, without renal calculi, focal lesion, or hydronephrosis. Bladder is unremarkable. Stomach/Bowel: Distal small bowel obstruction is noted with transition zone seen in the pelvis, best seen on image number 54 of series 2. Adjacent soft tissue density measuring 15 x 14 mm is noted  concerning for peritoneal implant. Small sliding-type hiatal hernia is noted. Vascular/Lymphatic: Atherosclerosis of abdominal aorta is noted without aneurysm or dissection. 1 cm left periaortic lymph node is noted. Reproductive: Uterus is unremarkable. Ovaries are not well visualized. Other: Abnormal soft tissue density is seen involving the omentum in left lower quadrant consistent with peritoneal carcinomatosis. Small amount of free fluid is noted in dependent portion of pelvis. 19 x 16 mm soft tissue density is noted in left posterior pelvis concerning for peritoneal implant. Musculoskeletal: No acute or significant osseous findings. IMPRESSION: Cholelithiasis. Distal small bowel obstruction is noted with transition zone seen in the pelvis, most likely due to adjacent peritoneal implant or metastatic disease. Omental caking is seen in left lower quadrant, with another peritoneal implant seen in left posterior pelvis. This is concerning for peritoneal carcinomatosis. Aortic atherosclerosis. Small sliding-type hiatal hernia. Electronically Signed   By: Marijo Conception, M.D.   On: 01/15/2017 21:29    ASSESSMENT: Abdominal pain, peritoneal carcinomatosis  PLAN:    1. Abdominal pain, peritoneal carcinomatosis: CT scan results review independently and reported as above highly suspicious for underlying malignancy. Appreciate surgical input.  CT guided biopsy ordered for tomorrow.  CA-125 and other tumor markers pending.  Patient will likely require Gyn-Onc input. This can occur as an outpatient on Wednesday, January 23, 2017 in the [facility].  Patient can also follow-up as an outpatient with Medical Oncology on the same day to discuss her biopsy results and treatment planning. 2. Partial small bowel obstruction: Possibly secondary to peritoneal carcinomatosis. NG tube in place. Appreciate surgical input. 3. History of breast cancer: Surgery and XRT occurred over 20 years ago. CA-27.29 pending.  Appreciate  consult, will follow.   Lloyd Huger, MD   01/16/2017 10:34 PM

## 2017-01-16 NOTE — H&P (Signed)
[place] [facility] [place] NAME: Lisa Hayden    MR#:  [id number]  DATE OF BIRTH:  [birth year]  DATE OF ADMISSION:  01/15/2017  PRIMARY CARE PHYSICIAN: Lelon Huh, MD   REQUESTING/REFERRING PHYSICIAN: Cinda Quest, MD  CHIEF COMPLAINT:   Chief Complaint  Patient presents with  . Abdominal Pain    HISTORY OF PRESENT ILLNESS:  Lisa Hayden  is a 83 y.o. female who presents with Abdominal pain with significant nausea vomiting. Patient states that she's been having increasing abdominal symptoms for the past 2 months or so. Tonight she had severe bouts of nausea and vomiting and abdominal pain for which she came to the ED for evaluation. CT scan showed likely peritoneal carcinomatosis along with a small bowel obstruction. Surgery was consulted and ordered placement of NG tube, but given the high likelihood for metastatic cancerous process here recommended Medicine admit the patient and coordinate with subspecialists.  PAST MEDICAL HISTORY:   Past Medical History:  Diagnosis Date  . Asthma   . Cancer ([place])   . Hypertension   . Osteoporosis     PAST SURGICAL HISTORY:   Past Surgical History:  Procedure Laterality Date  . APPENDECTOMY  1953  . BREAST LUMPECTOMY Left 10/29/94   negative nodes. Treated with radiation only  . BREAST SURGERY    . BUNIONECTOMY  1979  . CATARACT EXTRACTION, BILATERAL     left 07/29/2012; right 06/24/2012  . COLON SURGERY  05/1991   secondary to cancer   . ROTATOR CUFF REPAIR Right 01/25/10  . TONSILLECTOMY  1938   and adenoidectomy    SOCIAL HISTORY:   Social History  Substance Use Topics  . Smoking status: Former Research scientist (life sciences)  . Smokeless tobacco: Never Used  . Alcohol use 4.2 oz/week    7 Standard drinks or equivalent per week    FAMILY HISTORY:   Family History  Problem Relation Age of Onset  . CVA Mother   . Congestive Heart Failure Mother   . Diabetes Sister   . Asthma Father     DRUG  ALLERGIES:   Allergies  Allergen Reactions  . Actonel [Risedronate Sodium] Other (See Comments)    headache  . Cortisone     very alert and did not sleep for 24 hrs  . Daypro  [Oxaprozin]   . Meloxicam     MEDICATIONS AT HOME:   Prior to Admission medications   Medication Sig Start Date End Date Taking? Authorizing Provider  alendronate (FOSAMAX) 70 MG tablet Take 1 tablet (70 mg total) by mouth once a week. On Sunday 11/07/16  Yes Birdie Sons, MD  aspirin 81 MG chewable tablet Chew 81 mg by mouth daily.   Yes Historical Provider, MD  cholecalciferol (VITAMIN D) 1000 UNITS tablet Take 1,000 Units by mouth every evening. With dinner   Yes Historical Provider, MD    REVIEW OF SYSTEMS:  Review of Systems  Constitutional: Negative for chills, fever, malaise/fatigue and weight loss.  HENT: Negative for ear pain, hearing loss and tinnitus.   Eyes: Negative for blurred vision, double vision, pain and redness.  Respiratory: Negative for cough, hemoptysis and shortness of breath.   Cardiovascular: Negative for chest pain, palpitations, orthopnea and leg swelling.  Gastrointestinal: Positive for abdominal pain, nausea and vomiting. Negative for constipation and diarrhea.  Genitourinary: Negative for dysuria, frequency and hematuria.  Musculoskeletal: Negative for back pain, joint pain and neck pain.  Skin:  No acne, rash, or lesions  Neurological: Negative for dizziness, tremors, focal weakness and weakness.  Endo/Heme/Allergies: Negative for polydipsia. Does not bruise/bleed easily.  Psychiatric/Behavioral: Negative for depression. The patient is not nervous/anxious and does not have insomnia.      VITAL SIGNS:   Vitals:   01/15/17 2206 01/15/17 2207 01/15/17 2208 01/15/17 2209  BP:      Pulse: (!) 124 (!) 118 (!) 114 (!) 114  Resp: 18 (!) 22 15 16   Temp:      TempSrc:      SpO2: 98% 99% 99% 100%  Weight:      Height:       Wt Readings from Last 3 Encounters:   01/15/17 57.2 kg (126 lb)  11/07/16 61.2 kg (135 lb)  05/08/16 62.1 kg (137 lb)    PHYSICAL EXAMINATION:  Physical Exam  Vitals reviewed. Constitutional: She is oriented to person, place, and time. She appears well-developed and well-nourished. No distress.  HENT:  Head: Normocephalic and atraumatic.  Mouth/Throat: Oropharynx is clear and moist.  Eyes: Conjunctivae and EOM are normal. Pupils are equal, round, and reactive to light. No scleral icterus.  Neck: Normal range of motion. Neck supple. No JVD present. No thyromegaly present.  Cardiovascular: Normal rate, regular rhythm and intact distal pulses.  Exam reveals no gallop and no friction rub.   No murmur heard. Respiratory: Effort normal and breath sounds normal. No respiratory distress. She has no wheezes. She has no rales.  GI: Soft. Bowel sounds are normal. She exhibits no distension. There is tenderness.  Musculoskeletal: Normal range of motion. She exhibits no edema.  No arthritis, no gout  Lymphadenopathy:    She has no cervical adenopathy.  Neurological: She is alert and oriented to person, place, and time. No cranial nerve deficit.  No dysarthria, no aphasia  Skin: Skin is warm and dry. No rash noted. No erythema.  Psychiatric: She has a normal mood and affect. Her behavior is normal. Judgment and thought content normal.    LABORATORY PANEL:   CBC  Recent Labs Lab 01/15/17 1939  WBC 6.3  HGB 14.1  HCT 42.2  PLT 283   ------------------------------------------------------------------------------------------------------------------  Chemistries   Recent Labs Lab 01/15/17 1939  NA 134*  K 3.9  CL 102  CO2 26  GLUCOSE 162*  BUN 17  CREATININE 0.73  CALCIUM 9.2  AST 21  ALT 14  ALKPHOS 31*  BILITOT 0.9   ------------------------------------------------------------------------------------------------------------------  Cardiac Enzymes  Recent Labs Lab 01/15/17 1939  TROPONINI <0.03    ------------------------------------------------------------------------------------------------------------------  RADIOLOGY:  Dg Chest 2 View  Result Date: 01/15/2017 CLINICAL DATA:  Umbilical abdominal pain. Vomiting x4 since December. Asthma. Former smoker. EXAM: CHEST  2 VIEW COMPARISON:  05/02/2015 CXR FINDINGS: The heart size and mediastinal contours are within normal limits. Aortic atherosclerosis without aneurysm. The lungs are hyperinflated consistent with history of asthma. No pulmonary consolidation, effusion or pneumothorax. No overt pulmonary edema. Axillary clips are seen on the left. Old fracture deformity noted of the right seventh rib. Chronic anterior wedge fracture of T3. IMPRESSION: 1. Hyperinflated lungs.  No acute pulmonary disease. 2. Aortic atherosclerosis. 3. Chronic T3 vertebral body compression. Old right seventh rib fracture. Electronically Signed   By: Ashley Royalty M.D.   On: 01/15/2017 20:25   Dg Abd 1 View  Result Date: 01/15/2017 CLINICAL DATA:  Nasogastric tube placement.  Initial encounter. EXAM: ABDOMEN - 1 VIEW COMPARISON:  CT of the abdomen and pelvis performed earlier today at 8:56  p.m. FINDINGS: The patient's enteric tube is noted ending overlying the body of the stomach. The visualized bowel gas pattern is unremarkable. Residual contrast is seen within small bowel loops. Previously noted small bowel obstruction is difficult to assess on this image. No free intra-abdominal air is identified, though evaluation for free air is limited on a single supine view. The visualized osseous structures are within normal limits; the sacroiliac joints are unremarkable in appearance. The visualized lung bases are essentially clear. IMPRESSION: Enteric tube noted ending overlying the body of the stomach. Electronically Signed   By: Garald Balding M.D.   On: 01/15/2017 23:57   Ct Abdomen Pelvis W Contrast  Result Date: 01/15/2017 CLINICAL DATA:  Lower abdominal pain for 4  months. EXAM: CT ABDOMEN AND PELVIS WITH CONTRAST TECHNIQUE: Multidetector CT imaging of the abdomen and pelvis was performed using the standard protocol following bolus administration of intravenous contrast. CONTRAST:  142mL ISOVUE-300 IOPAMIDOL (ISOVUE-300) INJECTION 61% COMPARISON:  CT scan of October 25, 2009. FINDINGS: Lower chest: No acute abnormality. Hepatobiliary: Cholelithiasis is noted. No focal abnormality is noted in the liver. Pancreas: Unremarkable. No pancreatic ductal dilatation or surrounding inflammatory changes. Spleen: Normal in size without focal abnormality. Adrenals/Urinary Tract: Adrenal glands are unremarkable. Kidneys are normal, without renal calculi, focal lesion, or hydronephrosis. Bladder is unremarkable. Stomach/Bowel: Distal small bowel obstruction is noted with transition zone seen in the pelvis, best seen on image number 54 of series 2. Adjacent soft tissue density measuring 15 x 14 mm is noted concerning for peritoneal implant. Small sliding-type hiatal hernia is noted. Vascular/Lymphatic: Atherosclerosis of abdominal aorta is noted without aneurysm or dissection. 1 cm left periaortic lymph node is noted. Reproductive: Uterus is unremarkable. Ovaries are not well visualized. Other: Abnormal soft tissue density is seen involving the omentum in left lower quadrant consistent with peritoneal carcinomatosis. Small amount of free fluid is noted in dependent portion of pelvis. 19 x 16 mm soft tissue density is noted in left posterior pelvis concerning for peritoneal implant. Musculoskeletal: No acute or significant osseous findings. IMPRESSION: Cholelithiasis. Distal small bowel obstruction is noted with transition zone seen in the pelvis, most likely due to adjacent peritoneal implant or metastatic disease. Omental caking is seen in left lower quadrant, with another peritoneal implant seen in left posterior pelvis. This is concerning for peritoneal carcinomatosis. Aortic  atherosclerosis. Small sliding-type hiatal hernia. Electronically Signed   By: Marijo Conception, M.D.   On: 01/15/2017 21:29    EKG:   Orders placed or performed during the hospital encounter of 01/15/17  . ED EKG within 10 minutes  . ED EKG within 10 minutes    IMPRESSION AND PLAN:  Principal Problem:   Small bowel obstruction - surgery following, NG tube placed to suction, we will give when necessary antiemetics and pain control. Active Problems:   Peritoneal carcinomatosis [facility]) - oncology and gynecology consulted, CT scan does not show any clear primary cancerous process   BP (high blood pressure) - continue home meds  All the records are reviewed and case discussed with ED provider. Management plans discussed with the patient and/or family.  DVT PROPHYLAXIS: SubQ lovenox  GI PROPHYLAXIS: None  ADMISSION STATUS: Inpatient  CODE STATUS: Full Code Status History    This patient does not have a recorded code status. Please follow your organizational policy for patients in this situation.    Advance Directive Documentation   Flowsheet Row Most Recent Value  Type of Advance Directive  Healthcare Power of [place],  Living will  Pre-existing out of facility DNR order (yellow form or pink MOST form)  No data  "MOST" Form in Place?  No data      TOTAL TIME TAKING CARE OF THIS PATIENT: 45 minutes.    Tevis Dunavan [place] 01/16/2017, 12:10 AM  [facility]  Office  [phone]  CC: Primary care physician; Lelon Huh, MD

## 2017-01-16 NOTE — Progress Notes (Signed)
[place] at [facility]                                                                                                                                                                                  Patient Demographics   Lisa Hayden, is a 83 y.o. female, DOB - Dec 12, 1929, [place] date - 01/15/2017   Admitting Physician Lance Coon, MD  Outpatient Primary MD for the patient is Lelon Huh, MD   LOS - 0  Subjective:  Pt admited with sbo with concern for carcematosis  Ports that she had was recently having vaginal bleeding and underwent a endometrial biopsy by Dr. Leafy Ro of the [facility] Today reports that the abdominal pain is improved. Still has significant drainage from her NG tube   Review of Systems:   CONSTITUTIONAL: No documented fever. No fatigue, weakness. No weight gain, no weight loss.  EYES: No blurry or double vision.  ENT: No tinnitus. No postnasal drip. No redness of the oropharynx.  RESPIRATORY: No cough, no wheeze, no hemoptysis. No dyspnea.  CARDIOVASCULAR: No chest pain. No orthopnea. No palpitations. No syncope.  GASTROINTESTINAL: Positive nausea, no vomiting or diarrhea. Improved abdominal pain. No melena or hematochezia.  GENITOURINARY: No dysuria or hematuria.  ENDOCRINE: No polyuria or nocturia. No heat or cold intolerance.  HEMATOLOGY: No anemia. No bruising. No bleeding.  INTEGUMENTARY: No rashes. No lesions.  MUSCULOSKELETAL: No arthritis. No swelling. No gout.  NEUROLOGIC: No numbness, tingling, or ataxia. No seizure-type activity.  PSYCHIATRIC: No anxiety. No insomnia. No ADD.    Vitals:   Vitals:   01/16/17 0130 01/16/17 0159 01/16/17 0644 01/16/17 0730  BP: (!) 154/87 (!) 144/78 122/65 123/63  Pulse: 76 81 76 73  Resp:  18 14 16   Temp:  97.6 F (36.4 C) 97.9 F (36.6 C) 97.9 F (36.6 C)  TempSrc:  Oral Oral Oral  SpO2: 96% 95% 96% 100%  Weight:      Height:        Wt Readings from  Last 3 Encounters:  01/15/17 126 lb (57.2 kg)  11/07/16 135 lb (61.2 kg)  05/08/16 137 lb (62.1 kg)     Intake/Output Summary (Last 24 hours) at 01/16/17 1233 Last data filed at 01/16/17 0605  Gross per 24 hour  Intake          1260.71 ml  Output             1100 ml  Net           160.71 ml    Physical Exam:   GENERAL: Pleasant-appearing in no apparent distress.  HEAD, EYES, EARS, NOSE AND THROAT: Atraumatic,  normocephalic. Extraocular muscles are intact. Pupils equal and reactive to light. Sclerae anicteric. No conjunctival injection. No oro-pharyngeal erythema.  NECK: Supple. There is no jugular venous distention. No bruits, no lymphadenopathy, no thyromegaly.  HEART: Regular rate and rhythm,. No murmurs, no rubs, no clicks.  LUNGS: Clear to auscultation bilaterally. No rales or rhonchi. No wheezes.  ABDOMEN: Soft, Distended, no bowel sounds there is no guarding no rebound EXTREMITIES: No evidence of any cyanosis, clubbing, or peripheral edema.  +2 pedal and radial pulses bilaterally.  NEUROLOGIC: The patient is alert, awake, and oriented x3 with no focal motor or sensory deficits appreciated bilaterally.  SKIN: Moist and warm with no rashes appreciated.  Psych: Not anxious, depressed LN: No inguinal LN enlargement    Antibiotics   Anti-infectives    None      Medications   Scheduled Meds: . enoxaparin (LOVENOX) injection  40 mg Subcutaneous Daily   Continuous Infusions: . sodium chloride 75 mL/hr at 01/16/17 0228   PRN Meds:.acetaminophen **OR** acetaminophen, labetalol, morphine injection, ondansetron **OR** ondansetron (ZOFRAN) IV   Data Review:   Micro Results No results found for this or any previous visit (from the past 240 hour(s)).  Radiology Reports Dg Chest 2 View  Result Date: 01/15/2017 CLINICAL DATA:  Umbilical abdominal pain. Vomiting x4 since December. Asthma. Former smoker. EXAM: CHEST  2 VIEW COMPARISON:  05/02/2015 CXR FINDINGS: The heart  size and mediastinal contours are within normal limits. Aortic atherosclerosis without aneurysm. The lungs are hyperinflated consistent with history of asthma. No pulmonary consolidation, effusion or pneumothorax. No overt pulmonary edema. Axillary clips are seen on the left. Old fracture deformity noted of the right seventh rib. Chronic anterior wedge fracture of T3. IMPRESSION: 1. Hyperinflated lungs.  No acute pulmonary disease. 2. Aortic atherosclerosis. 3. Chronic T3 vertebral body compression. Old right seventh rib fracture. Electronically Signed   By: Ashley Royalty M.D.   On: 01/15/2017 20:25   Dg Abd 1 View  Result Date: 01/15/2017 CLINICAL DATA:  Nasogastric tube placement.  Initial encounter. EXAM: ABDOMEN - 1 VIEW COMPARISON:  CT of the abdomen and pelvis performed earlier today at 8:56 p.m. FINDINGS: The patient's enteric tube is noted ending overlying the body of the stomach. The visualized bowel gas pattern is unremarkable. Residual contrast is seen within small bowel loops. Previously noted small bowel obstruction is difficult to assess on this image. No free intra-abdominal air is identified, though evaluation for free air is limited on a single supine view. The visualized osseous structures are within normal limits; the sacroiliac joints are unremarkable in appearance. The visualized lung bases are essentially clear. IMPRESSION: Enteric tube noted ending overlying the body of the stomach. Electronically Signed   By: Garald Balding M.D.   On: 01/15/2017 23:57   Ct Abdomen Pelvis W Contrast  Result Date: 01/15/2017 CLINICAL DATA:  Lower abdominal pain for 4 months. EXAM: CT ABDOMEN AND PELVIS WITH CONTRAST TECHNIQUE: Multidetector CT imaging of the abdomen and pelvis was performed using the standard protocol following bolus administration of intravenous contrast. CONTRAST:  147mL ISOVUE-300 IOPAMIDOL (ISOVUE-300) INJECTION 61% COMPARISON:  CT scan of October 25, 2009. FINDINGS: Lower chest: No  acute abnormality. Hepatobiliary: Cholelithiasis is noted. No focal abnormality is noted in the liver. Pancreas: Unremarkable. No pancreatic ductal dilatation or surrounding inflammatory changes. Spleen: Normal in size without focal abnormality. Adrenals/Urinary Tract: Adrenal glands are unremarkable. Kidneys are normal, without renal calculi, focal lesion, or hydronephrosis. Bladder is unremarkable. Stomach/Bowel: Distal small bowel obstruction is  noted with transition zone seen in the pelvis, best seen on image number 54 of series 2. Adjacent soft tissue density measuring 15 x 14 mm is noted concerning for peritoneal implant. Small sliding-type hiatal hernia is noted. Vascular/Lymphatic: Atherosclerosis of abdominal aorta is noted without aneurysm or dissection. 1 cm left periaortic lymph node is noted. Reproductive: Uterus is unremarkable. Ovaries are not well visualized. Other: Abnormal soft tissue density is seen involving the omentum in left lower quadrant consistent with peritoneal carcinomatosis. Small amount of free fluid is noted in dependent portion of pelvis. 19 x 16 mm soft tissue density is noted in left posterior pelvis concerning for peritoneal implant. Musculoskeletal: No acute or significant osseous findings. IMPRESSION: Cholelithiasis. Distal small bowel obstruction is noted with transition zone seen in the pelvis, most likely due to adjacent peritoneal implant or metastatic disease. Omental caking is seen in left lower quadrant, with another peritoneal implant seen in left posterior pelvis. This is concerning for peritoneal carcinomatosis. Aortic atherosclerosis. Small sliding-type hiatal hernia. Electronically Signed   By: Marijo Conception, M.D.   On: 01/15/2017 21:29     CBC  Recent Labs Lab 01/15/17 1939 01/16/17 0416  WBC 6.3 9.9  HGB 14.1 13.5  HCT 42.2 38.7  PLT 283 270  MCV 88.2 87.2  MCH 29.5 30.4  MCHC 33.5 34.8  RDW 13.6 13.4    Chemistries   Recent Labs Lab  01/15/17 1939 01/16/17 0416  NA 134* 139  K 3.9 3.9  CL 102 105  CO2 26 25  GLUCOSE 162* 132*  BUN 17 14  CREATININE 0.73 0.50  CALCIUM 9.2 8.5*  AST 21  --   ALT 14  --   ALKPHOS 31*  --   BILITOT 0.9  --    ------------------------------------------------------------------------------------------------------------------ estimated creatinine clearance is 41.4 mL/min (by C-G formula based on SCr of 0.5 mg/dL). ------------------------------------------------------------------------------------------------------------------ No results for input(s): HGBA1C in the last 72 hours. ------------------------------------------------------------------------------------------------------------------ No results for input(s): CHOL, HDL, LDLCALC, TRIG, CHOLHDL, LDLDIRECT in the last 72 hours. ------------------------------------------------------------------------------------------------------------------ No results for input(s): TSH, T4TOTAL, T3FREE, THYROIDAB in the last 72 hours.  Invalid input(s): FREET3 ------------------------------------------------------------------------------------------------------------------ No results for input(s): VITAMINB12, FOLATE, FERRITIN, TIBC, IRON, RETICCTPCT in the last 72 hours.  Coagulation profile  Recent Labs Lab 01/15/17 2249  INR 1.17    No results for input(s): DDIMER in the last 72 hours.  Cardiac Enzymes  Recent Labs Lab 01/15/17 1939  TROPONINI <0.03   ------------------------------------------------------------------------------------------------------------------ Invalid input(s): POCBNP    Assessment & Plan  Patient's 83 year old admitted with small bowel obstruction  1.   Small bowel obstruction -still has significant amount of drainage continue NG tube and supportive care  2. Peritoneal carcinomatosis [facility]) - oncology and gynecology consulted, CT scan does not show any clear primary cancerous process Patient had a recent  endometrial biopsy due to vaginal bleeding, I try to contact Dr. Migdalia Dk office but was not able to get in touch with anyone. Await GYN and oncology consult I will add cancer markers including CA 129, CA-19-9 and CEA level  3.   hypertension use when necessary IV labetalol 4. Miscellaneous Lovenox for DVT prophylaxis    Code Status Orders        Start     Ordered   01/16/17 0155  Full code  Continuous     01/16/17 0154    Code Status History    Date Active Date Inactive Code Status Order ID Comments User Context   This patient has a  current code status but no historical code status.    Advance Directive Documentation   Flowsheet Row Most Recent Value  Type of Advance Directive  Healthcare Power of Attorney, Living will  Pre-existing out of facility DNR order (yellow form or pink MOST form)  No data  "MOST" Form in Place?  No data           Consults  Gyn, surgery, oncology  DVT Prophylaxis  Lovenox   Lab Results  Component Value Date   PLT 270 01/16/2017     Time Spent in minutes  31min  Greater than 50% of time spent in care coordination and counseling patient regarding the condition and plan of care.   Dustin Flock M.D on 01/16/2017 at 12:33 PM  Between 7am to 6pm - Pager - [phone]  After 6pm go to [email or web address] - password [email or web address] [facility]   Office  [phone]

## 2017-01-16 NOTE — Progress Notes (Signed)
CC: Small bowel obstruction Subjective: Patient reports that her abdominal discomfort has improved since admission. She has not had any nausea or vomiting since the NG tube was placed. She is actually passed flatus within the hour of my coming in to see her. She is keeping notes due to the multiple providers and complexity of her problem.  Objective: Vital signs in last 24 hours: Temp:  [97.6 F (36.4 C)-98.4 F (36.9 C)] 98.3 F (36.8 C) (02/28 1253) Pulse Rate:  [73-124] 76 (02/28 1253) Resp:  [10-24] 20 (02/28 1253) BP: (122-184)/(60-114) 137/60 (02/28 1253) SpO2:  [95 %-100 %] 98 % (02/28 1253) Weight:  [57.2 kg (126 lb)] 57.2 kg (126 lb) (02/27 1921) Last BM Date: 01/15/17  Intake/Output from previous day: 02/27 0701 - 02/28 0700 In: 1260.7 [I.V.:260.7; IV Piggyback:1000] Out: 1100 [Urine:500; Emesis/NG output:600] Intake/Output this shift: No intake/output data recorded.  Physical exam:  Gen.: No acute distress Abdomen: Soft, nontender, nondistended  Lab Results: CBC   Recent Labs  01/15/17 1939 01/16/17 0416  WBC 6.3 9.9  HGB 14.1 13.5  HCT 42.2 38.7  PLT 283 270   BMET  Recent Labs  01/15/17 1939 01/16/17 0416  NA 134* 139  K 3.9 3.9  CL 102 105  CO2 26 25  GLUCOSE 162* 132*  BUN 17 14  CREATININE 0.73 0.50  CALCIUM 9.2 8.5*   PT/INR  Recent Labs  01/15/17 2249  LABPROT 15.0  INR 1.17   ABG No results for input(s): PHART, HCO3 in the last 72 hours.  Invalid input(s): PCO2, PO2  Studies/Results: Dg Chest 2 View  Result Date: 01/15/2017 CLINICAL DATA:  Umbilical abdominal pain. Vomiting x4 since December. Asthma. Former smoker. EXAM: CHEST  2 VIEW COMPARISON:  05/02/2015 CXR FINDINGS: The heart size and mediastinal contours are within normal limits. Aortic atherosclerosis without aneurysm. The lungs are hyperinflated consistent with history of asthma. No pulmonary consolidation, effusion or pneumothorax. No overt pulmonary edema. Axillary  clips are seen on the left. Old fracture deformity noted of the right seventh rib. Chronic anterior wedge fracture of T3. IMPRESSION: 1. Hyperinflated lungs.  No acute pulmonary disease. 2. Aortic atherosclerosis. 3. Chronic T3 vertebral body compression. Old right seventh rib fracture. Electronically Signed   By: Ashley Royalty M.D.   On: 01/15/2017 20:25   Dg Abd 1 View  Result Date: 01/15/2017 CLINICAL DATA:  Nasogastric tube placement.  Initial encounter. EXAM: ABDOMEN - 1 VIEW COMPARISON:  CT of the abdomen and pelvis performed earlier today at 8:56 p.m. FINDINGS: The patient's enteric tube is noted ending overlying the body of the stomach. The visualized bowel gas pattern is unremarkable. Residual contrast is seen within small bowel loops. Previously noted small bowel obstruction is difficult to assess on this image. No free intra-abdominal air is identified, though evaluation for free air is limited on a single supine view. The visualized osseous structures are within normal limits; the sacroiliac joints are unremarkable in appearance. The visualized lung bases are essentially clear. IMPRESSION: Enteric tube noted ending overlying the body of the stomach. Electronically Signed   By: Garald Balding M.D.   On: 01/15/2017 23:57   Ct Abdomen Pelvis W Contrast  Result Date: 01/15/2017 CLINICAL DATA:  Lower abdominal pain for 4 months. EXAM: CT ABDOMEN AND PELVIS WITH CONTRAST TECHNIQUE: Multidetector CT imaging of the abdomen and pelvis was performed using the standard protocol following bolus administration of intravenous contrast. CONTRAST:  153mL ISOVUE-300 IOPAMIDOL (ISOVUE-300) INJECTION 61% COMPARISON:  CT scan of  October 25, 2009. FINDINGS: Lower chest: No acute abnormality. Hepatobiliary: Cholelithiasis is noted. No focal abnormality is noted in the liver. Pancreas: Unremarkable. No pancreatic ductal dilatation or surrounding inflammatory changes. Spleen: Normal in size without focal abnormality.  Adrenals/Urinary Tract: Adrenal glands are unremarkable. Kidneys are normal, without renal calculi, focal lesion, or hydronephrosis. Bladder is unremarkable. Stomach/Bowel: Distal small bowel obstruction is noted with transition zone seen in the pelvis, best seen on image number 54 of series 2. Adjacent soft tissue density measuring 15 x 14 mm is noted concerning for peritoneal implant. Small sliding-type hiatal hernia is noted. Vascular/Lymphatic: Atherosclerosis of abdominal aorta is noted without aneurysm or dissection. 1 cm left periaortic lymph node is noted. Reproductive: Uterus is unremarkable. Ovaries are not well visualized. Other: Abnormal soft tissue density is seen involving the omentum in left lower quadrant consistent with peritoneal carcinomatosis. Small amount of free fluid is noted in dependent portion of pelvis. 19 x 16 mm soft tissue density is noted in left posterior pelvis concerning for peritoneal implant. Musculoskeletal: No acute or significant osseous findings. IMPRESSION: Cholelithiasis. Distal small bowel obstruction is noted with transition zone seen in the pelvis, most likely due to adjacent peritoneal implant or metastatic disease. Omental caking is seen in left lower quadrant, with another peritoneal implant seen in left posterior pelvis. This is concerning for peritoneal carcinomatosis. Aortic atherosclerosis. Small sliding-type hiatal hernia. Electronically Signed   By: Marijo Conception, M.D.   On: 01/15/2017 21:29    Anti-infectives: Anti-infectives    None      Assessment/Plan:  83 year old female with a small bowel obstruction. Also appears to have carcinomatosis. Difficult to ascertain whether or not her bowel obstruction is truly from cancer involvement or simply from adhesions from her multiple prior surgeries. Regardless of the cause symptomatically she is improving with NG tube decompression. Recommend continuing nothing by mouth status, NG tube decompression,  strict I's and O's. Discussed with the patient the likely success of resolution of her small bowel obstruction with NG tube and all questions drain to her satisfaction currently. Surgery will continue to follow along closely in case an operative intervention is required for her bowel obstruction.  Avree Szczygiel T. Adonis Huguenin, MD, [facility] General Surgeon [facility]  [facility] [phone] [facility] [phone] 01/16/2017

## 2017-01-16 NOTE — ED Notes (Signed)
Pass code for patient [id number], husband and patient aware.

## 2017-01-17 ENCOUNTER — Inpatient Hospital Stay: Payer: Medicare Other

## 2017-01-17 LAB — BASIC METABOLIC PANEL
ANION GAP: 8 (ref 5–15)
BUN: 12 mg/dL (ref 6–20)
CALCIUM: 7.9 mg/dL — AB (ref 8.9–10.3)
CO2: 24 mmol/L (ref 22–32)
Chloride: 107 mmol/L (ref 101–111)
Creatinine, Ser: 0.52 mg/dL (ref 0.44–1.00)
GFR calc Af Amer: >60 mL/min (ref >60)
GLUCOSE: 81 mg/dL (ref 65–99)
Potassium: 3.6 mmol/L (ref 3.5–5.1)
Sodium: 139 mmol/L (ref 135–145)

## 2017-01-17 LAB — CA 125: CA 125: 74.8 U/mL — AB (ref 0.0–38.1)

## 2017-01-17 LAB — CBC
HCT: 38.1 % (ref 35.0–47.0)
HEMOGLOBIN: 13.2 g/dL (ref 12.0–16.0)
MCH: 30.6 pg (ref 26.0–34.0)
MCHC: 34.7 g/dL (ref 32.0–36.0)
MCV: 88.2 fL (ref 80.0–100.0)
Platelets: 241 10*3/uL (ref 150–440)
RBC: 4.32 MIL/uL (ref 3.80–5.20)
RDW: 13.4 % (ref 11.5–14.5)
WBC: 7.3 10*3/uL (ref 3.6–11.0)

## 2017-01-17 LAB — CA 19-9 (SERIAL): CA 19-9: 1 U/mL (ref 0–35)

## 2017-01-17 LAB — CEA: CEA: 2 ng/mL (ref 0.0–4.7)

## 2017-01-17 MED ORDER — SODIUM CHLORIDE 0.9 % IV SOLN: INTRAVENOUS | Status: DC

## 2017-01-17 MED ORDER — FENTANYL CITRATE (PF) 100 MCG/2ML IJ SOLN
INTRAMUSCULAR | Status: AC | PRN
  Administered 2017-01-17: 50 ug via INTRAVENOUS

## 2017-01-17 MED ORDER — FENTANYL CITRATE (PF) 100 MCG/2ML IJ SOLN
INTRAMUSCULAR | Status: AC
  Administered 2017-01-17: 18:00:00
  Filled 2017-01-17: qty 4

## 2017-01-17 MED ORDER — MIDAZOLAM HCL 5 MG/5ML IJ SOLN
INTRAMUSCULAR | Status: AC | PRN
  Administered 2017-01-17 (×2): 1 mg via INTRAVENOUS

## 2017-01-17 MED ORDER — SODIUM CHLORIDE 0.9 % IV SOLN
INTRAVENOUS | Status: DC
  Administered 2017-01-17 – 2017-01-19 (×6): via INTRAVENOUS

## 2017-01-17 MED ORDER — MIDAZOLAM HCL 5 MG/5ML IJ SOLN
INTRAMUSCULAR | Status: AC
  Administered 2017-01-17: 18:00:00
  Filled 2017-01-17: qty 5

## 2017-01-17 NOTE — Progress Notes (Signed)
[facility]                                                                                                                                                                                  Patient Demographics   Lisa Hayden, is a 83 y.o. female, DOB - Jan 21, 1930, [id number]  Admit date - 01/15/2017   Admitting Physician Lance Coon, MD  Outpatient Primary MD for the patient is Lelon Huh, MD   LOS - 1  Subjective:  Decrease NG tube and output patient denies any abdominal pain nausea or vomiting   Review of Systems:   CONSTITUTIONAL: No documented fever. No fatigue, weakness. No weight gain, no weight loss.  EYES: No blurry or double vision.  ENT: No tinnitus. No postnasal drip. No redness of the oropharynx.  RESPIRATORY: No cough, no wheeze, no hemoptysis. No dyspnea.  CARDIOVASCULAR: No chest pain. No orthopnea. No palpitations. No syncope.  GASTROINTESTINAL:Resolved nausea, no vomiting or diarrhea. Resolved abdominal pain. No melena or hematochezia.  GENITOURINARY: No dysuria or hematuria.  ENDOCRINE: No polyuria or nocturia. No heat or cold intolerance.  HEMATOLOGY: No anemia. No bruising. No bleeding.  INTEGUMENTARY: No rashes. No lesions.  MUSCULOSKELETAL: No arthritis. No swelling. No gout.  NEUROLOGIC: No numbness, tingling, or ataxia. No seizure-type activity.  PSYCHIATRIC: No anxiety. No insomnia. No ADD.    Vitals:   Vitals:   01/16/17 1253 01/16/17 2121 01/17/17 0524 01/17/17 0737  BP: 137/60 (!) 130/56 (!) 147/69 (!) 150/71  Pulse: 76 73 89 78  Resp: 20 18 18    Temp: 98.3 F (36.8 C) 98.3 F (36.8 C) 98.2 F (36.8 C) 97.5 F (36.4 C)  TempSrc: Oral Oral Oral Oral  SpO2: 98% 95% 94% 95%  Weight:      Height:        Wt Readings from Last 3 Encounters:  01/15/17 126 lb (57.2 kg)  11/07/16 135 lb (61.2 kg)  05/08/16 137 lb (62.1 kg)     Intake/Output Summary (Last 24 hours) at 01/17/17 1212 Last data  filed at 01/17/17 1100  Gross per 24 hour  Intake              993 ml  Output             1900 ml  Net             -907 ml    Physical Exam:   GENERAL: Pleasant-appearing in no apparent distress.  HEAD, EYES, EARS, NOSE AND THROAT: Atraumatic, normocephalic. Extraocular muscles are intact. Pupils equal and reactive to light. Sclerae anicteric. No conjunctival injection. No oro-pharyngeal erythema.  NECK: Supple. There is  no jugular venous distention. No bruits, no lymphadenopathy, no thyromegaly.  HEART: Regular rate and rhythm,. No murmurs, no rubs, no clicks.  LUNGS: Clear to auscultation bilaterally. No rales or rhonchi. No wheezes.  ABDOMEN: Soft, , no bowel sounds there is no guarding no rebound EXTREMITIES: No evidence of any cyanosis, clubbing, or peripheral edema.  +2 pedal and radial pulses bilaterally.  NEUROLOGIC: The patient is alert, awake, and oriented x3 with no focal motor or sensory deficits appreciated bilaterally.  SKIN: Moist and warm with no rashes appreciated.  Psych: Not anxious, depressed LN: No inguinal LN enlargement    Antibiotics   Anti-infectives    None      Medications   Scheduled Meds: . enoxaparin (LOVENOX) injection  40 mg Subcutaneous Daily   Continuous Infusions: . sodium chloride    . sodium chloride     PRN Meds:.acetaminophen **OR** acetaminophen, labetalol, morphine injection, ondansetron **OR** ondansetron (ZOFRAN) IV   Data Review:   Micro Results No results found for this or any previous visit (from the past 240 hour(s)).  Radiology Reports Dg Chest 2 View  Result Date: 01/15/2017 CLINICAL DATA:  Umbilical abdominal pain. Vomiting x4 since December. Asthma. Former smoker. EXAM: CHEST  2 VIEW COMPARISON:  05/02/2015 CXR FINDINGS: The heart size and mediastinal contours are within normal limits. Aortic atherosclerosis without aneurysm. The lungs are hyperinflated consistent with history of asthma. No pulmonary consolidation,  effusion or pneumothorax. No overt pulmonary edema. Axillary clips are seen on the left. Old fracture deformity noted of the right seventh rib. Chronic anterior wedge fracture of T3. IMPRESSION: 1. Hyperinflated lungs.  No acute pulmonary disease. 2. Aortic atherosclerosis. 3. Chronic T3 vertebral body compression. Old right seventh rib fracture. Electronically Signed   By: Ashley Royalty M.D.   On: 01/15/2017 20:25   Dg Abd 1 View  Result Date: 01/15/2017 CLINICAL DATA:  Nasogastric tube placement.  Initial encounter. EXAM: ABDOMEN - 1 VIEW COMPARISON:  CT of the abdomen and pelvis performed earlier today at 8:56 p.m. FINDINGS: The patient's enteric tube is noted ending overlying the body of the stomach. The visualized bowel gas pattern is unremarkable. Residual contrast is seen within small bowel loops. Previously noted small bowel obstruction is difficult to assess on this image. No free intra-abdominal air is identified, though evaluation for free air is limited on a single supine view. The visualized osseous structures are within normal limits; the sacroiliac joints are unremarkable in appearance. The visualized lung bases are essentially clear. IMPRESSION: Enteric tube noted ending overlying the body of the stomach. Electronically Signed   By: Garald Balding M.D.   On: 01/15/2017 23:57   Ct Abdomen Pelvis W Contrast  Result Date: 01/15/2017 CLINICAL DATA:  Lower abdominal pain for 4 months. EXAM: CT ABDOMEN AND PELVIS WITH CONTRAST TECHNIQUE: Multidetector CT imaging of the abdomen and pelvis was performed using the standard protocol following bolus administration of intravenous contrast. CONTRAST:  181mL ISOVUE-300 IOPAMIDOL (ISOVUE-300) INJECTION 61% COMPARISON:  CT scan of October 25, 2009. FINDINGS: Lower chest: No acute abnormality. Hepatobiliary: Cholelithiasis is noted. No focal abnormality is noted in the liver. Pancreas: Unremarkable. No pancreatic ductal dilatation or surrounding inflammatory  changes. Spleen: Normal in size without focal abnormality. Adrenals/Urinary Tract: Adrenal glands are unremarkable. Kidneys are normal, without renal calculi, focal lesion, or hydronephrosis. Bladder is unremarkable. Stomach/Bowel: Distal small bowel obstruction is noted with transition zone seen in the pelvis, best seen on image number 54 of series 2. Adjacent soft tissue density  measuring 15 x 14 mm is noted concerning for peritoneal implant. Small sliding-type hiatal hernia is noted. Vascular/Lymphatic: Atherosclerosis of abdominal aorta is noted without aneurysm or dissection. 1 cm left periaortic lymph node is noted. Reproductive: Uterus is unremarkable. Ovaries are not well visualized. Other: Abnormal soft tissue density is seen involving the omentum in left lower quadrant consistent with peritoneal carcinomatosis. Small amount of free fluid is noted in dependent portion of pelvis. 19 x 16 mm soft tissue density is noted in left posterior pelvis concerning for peritoneal implant. Musculoskeletal: No acute or significant osseous findings. IMPRESSION: Cholelithiasis. Distal small bowel obstruction is noted with transition zone seen in the pelvis, most likely due to adjacent peritoneal implant or metastatic disease. Omental caking is seen in left lower quadrant, with another peritoneal implant seen in left posterior pelvis. This is concerning for peritoneal carcinomatosis. Aortic atherosclerosis. Small sliding-type hiatal hernia. Electronically Signed   By: Marijo Conception, M.D.   On: 01/15/2017 21:29     CBC  Recent Labs Lab 01/15/17 1939 01/16/17 0416 01/17/17 0524  WBC 6.3 9.9 7.3  HGB 14.1 13.5 13.2  HCT 42.2 38.7 38.1  PLT 283 270 241  MCV 88.2 87.2 88.2  MCH 29.5 30.4 30.6  MCHC 33.5 34.8 34.7  RDW 13.6 13.4 13.4    Chemistries   Recent Labs Lab 01/15/17 1939 01/16/17 0416 01/17/17 0524  NA 134* 139 139  K 3.9 3.9 3.6  CL 102 105 107  CO2 26 25 24   GLUCOSE 162* 132* 81  BUN  17 14 12   CREATININE 0.73 0.50 0.52  CALCIUM 9.2 8.5* 7.9*  AST 21  --   --   ALT 14  --   --   ALKPHOS 31*  --   --   BILITOT 0.9  --   --    ------------------------------------------------------------------------------------------------------------------ estimated creatinine clearance is 41.4 mL/min (by C-G formula based on SCr of 0.52 mg/dL). ------------------------------------------------------------------------------------------------------------------ No results for input(s): HGBA1C in the last 72 hours. ------------------------------------------------------------------------------------------------------------------ No results for input(s): CHOL, HDL, LDLCALC, TRIG, CHOLHDL, LDLDIRECT in the last 72 hours. ------------------------------------------------------------------------------------------------------------------ No results for input(s): TSH, T4TOTAL, T3FREE, THYROIDAB in the last 72 hours.  Invalid input(s): FREET3 ------------------------------------------------------------------------------------------------------------------ No results for input(s): VITAMINB12, FOLATE, FERRITIN, TIBC, IRON, RETICCTPCT in the last 72 hours.  Coagulation profile  Recent Labs Lab 01/15/17 2249  INR 1.17    No results for input(s): DDIMER in the last 72 hours.  Cardiac Enzymes  Recent Labs Lab 01/15/17 1939  TROPONINI <0.03   ------------------------------------------------------------------------------------------------------------------ Invalid input(s): POCBNP    Assessment & Plan  Patient's 83 year old admitted with small bowel obstruction  1.   Small bowel obstruction - Improved patient's NG tube has been clamped for surgery Will order abdominal x-ray for follow-up We'll place patient back on IV fluids  2. Peritoneal carcinomatosis [facility]) - appreciate oncology and gynecology consulted, CT scan does not show any clear primary cancerous process Patient had a recent  endometrial biopsy due to vaginal bleeding, biopsy results negative Await GYN consult C1 29 is elevated suggestive ovarian pathology Patient will be having CT-guided biopsy of the omentum lesions  3.   hypertension use when necessary IV labetalol 4. Miscellaneous Lovenox for DVT prophylaxis    Code Status Orders        Start     Ordered   01/16/17 0155  Full code  Continuous     01/16/17 0154    Code Status History    Date Active Date Inactive Code  Status Order ID Comments User Context   This patient has a current code status but no historical code status.    Advance Directive Documentation   Flowsheet Row Most Recent Value  Type of Advance Directive  Healthcare Power of Attorney, Living will  Pre-existing out of facility DNR order (yellow form or pink MOST form)  No data  "MOST" Form in Place?  No data           Consults  Gyn, surgery, oncology  DVT Prophylaxis  Lovenox   Lab Results  Component Value Date   PLT 241 01/17/2017     Time Spent in minutes  51min  Greater than 50% of time spent in care coordination and counseling patient regarding the condition and plan of care.   Dustin Flock M.D on 01/17/2017 at 12:12 PM  Between 7am to 6pm - Pager - [phone]  After 6pm go to [email or web address] - password [email or web address] [facility]   Office  [phone]

## 2017-01-17 NOTE — Progress Notes (Signed)
Currently patient is resting. NG intact and is working effectively. Patient complains of no pain or discomfort. Will continue to monitor patient to end of shift.

## 2017-01-17 NOTE — Progress Notes (Signed)
  Patient seen and reexamine this afternoon. NG tube were hooked to suction with minimal output. Nursing staff to remove NG tube now. Okay for have ice chips tonight. If does well overnight without recurrence of nausea, vomiting, abdominal pain would then be okay to start a clear liquid diet.  Clayburn Pert, MD [facility]  Day [facility] [phone] Night [facility] [phone]

## 2017-01-17 NOTE — Procedures (Signed)
Interventional Radiology Procedure Note  Procedure: CT guided core biopsy of peritoneal mass  Complications: None  Estimated Blood Loss: < 10 mL  Anterior LLQ peritoneal tumor sampled under CT guidance with 18 G core biopsy x 3.  Venetia Night. Kathlene Cote, M.D Pager:  [phone]

## 2017-01-17 NOTE — Consult Note (Addendum)
Consult History and Physical   SERVICE: Gynecology   Patient Name: Lisa Hayden Patient MRN:   [id number]  CC: Peritoneal carcinomatosis  HPI: Lisa Hayden is a 83 y.o. presenting with small bowel obstruction. CT scan in the emergency department revealed carcinomatosis with omental caking of unknown etiology, possibly ovarian. Patient has a history of breast cancer, treated with lumpectomy and radiation as well as a distant history of colon cancer, also treated with surgery.  Hx of HRT use for 9yrs in 1980s. S/p Lumpectomy in 1998, on tamoxifen completed 2003. She was also on Premarin vaginally prescribed by a outside provider until 10/2017. She relates this script was " for osteoporosis and supplemented with alendronate."  She recently 10/2016 had an episode of postmenopausal bleeding. Ovaries were not noted on TVUS. ES <43mm and endometrial biopsy with no evidence of malignancy or hyperplasia. A saline sonogram was negative for intrauterine pathology. I have treated her in the past for lichen sclerosis with high potency steroids, not currently sx.  Onc Hx: Hx of colon cancer dx in 1992 with resection Hx of breast cancer - with lumpectomy in 1997, stopped HRT, +radiation, on tamoxifen until 2003 Hx of HRT use until breast cancer dx  In [facility] study On vagina premarin after dx of Lichen Sclerosis and urethral diverticulum in 2016. I have asked her to stop this med with this PMB of unknown etiology.  Review of Systems: positives in bold GEN:   fevers, chills, weight changes, appetite changes, fatigue, night sweats HEENT:  HA, vision changes, hearing loss, congestion, rhinorrhea, sinus pressure, dysphagia CV:   CP, palpitations PULM:  SOB, cough GI:  abd pain, N/V/D/C GU:  dysuria, urgency, frequency MSK:  arthralgias, myalgias, back pain, swelling SKIN:  rashes, color changes, pallor NEURO:  numbness, weakness, tingling, seizures, dizziness, tremors PSYCH:  depression, anxiety,  behavioral problems, confusion  HEME/LYMPH:  easy bruising or bleeding ENDO:  heat/cold intolerance  Past Obstetrical History: OB History    Gravida Para Term Preterm AB Living   1 1           SAB TAB Ectopic Multiple Live Births                  Past Gynecologic History: No LMP recorded. Patient is postmenopausal.   Past Medical History: Past Medical History:  Diagnosis Date  . Asthma   . Cancer ([place])   . Hypertension   . Osteoporosis     Past Surgical History:   Past Surgical History:  Procedure Laterality Date  . APPENDECTOMY  1953  . BREAST LUMPECTOMY Left 10/29/94   negative nodes. Treated with radiation only  . BREAST SURGERY    . BUNIONECTOMY  1979  . CATARACT EXTRACTION, BILATERAL     left 07/29/2012; right 06/24/2012  . COLON SURGERY  05/1991   secondary to cancer   . ROTATOR CUFF REPAIR Right 01/25/10  . TONSILLECTOMY  1938   and adenoidectomy    Family History:  family history includes Asthma in her father; CVA in her mother; Congestive Heart Failure in her mother; Diabetes in her sister.  Social History:  Social History   Social History  . Marital status: Married    Spouse name: [name]  . Number of children: 1  . Years of education: College   Occupational History  . Retired    Social History Main Topics  . Smoking status: Former Research scientist (life sciences)  . Smokeless tobacco: Never Used  . Alcohol use 4.2 oz/week  7 Standard drinks or equivalent per week  . Drug use: No  . Sexual activity: Not on file   Other Topics Concern  . Not on file   Social History Narrative  . No narrative on file    Home Medications:  Medications reconciled in EPIC  No current facility-administered medications on file prior to encounter.    Current Outpatient Prescriptions on File Prior to Encounter  Medication Sig Dispense Refill  . alendronate (FOSAMAX) 70 MG tablet Take 1 tablet (70 mg total) by mouth once a week. On Sunday 12 tablet 4  . cholecalciferol (VITAMIN D)  1000 UNITS tablet Take 1,000 Units by mouth every evening. With dinner      Allergies:  Allergies  Allergen Reactions  . Actonel [Risedronate Sodium] Other (See Comments)    headache  . Cortisone     very alert and did not sleep for 24 hrs  . Daypro  [Oxaprozin]   . Meloxicam     Physical Exam:  Temp:  [97.5 F (36.4 C)-98.3 F (36.8 C)] 97.5 F (36.4 C) (03/01 0737) Pulse Rate:  [73-89] 78 (03/01 0737) Resp:  [18-20] 18 (03/01 0524) BP: (130-150)/(56-71) 150/71 (03/01 0737) SpO2:  [94 %-98 %] 95 % (03/01 0737)   General Appearance:  Well developed, well nourished, no acute distress, alert and oriented x3. NG tube in place, draining dark green fluid. Excellent historian. HEENT:  Normocephalic atraumatic, extraocular movements intact, moist mucous membranes Cardiovascular:  Normal S1/S2, regular rate and rhythm, no murmurs Pulmonary:  clear to auscultation, no wheezes, rales or rhonchi, symmetric air entry, good air exchange Abdomen:  Bowel sounds present, soft, nontender, nondistended, no abnormal masses palpable Extremities:  Full range of motion, no pedal edema, 2+ distal pulses, no tenderness Skin:  normal coloration and turgor, no rashes, no suspicious skin lesions noted  Neurologic:  Cranial nerves 2-12 grossly intact, normal muscle tone,  Psychiatric:  Normal mood and affect, appropriate, no AH/VH Pelvic:  deferred  Labs/Studies:   CBC and Coags:  Lab Results  Component Value Date   WBC 7.3 01/17/2017   NEUTOPHILPCT 54 05/09/2016   HGB 13.2 01/17/2017   HCT 38.1 01/17/2017   MCV 88.2 01/17/2017   PLT 241 01/17/2017   INR 1.17 01/15/2017   CMP:  Lab Results  Component Value Date   NA 139 01/17/2017   K 3.6 01/17/2017   CL 107 01/17/2017   CO2 24 01/17/2017   BUN 12 01/17/2017   CREATININE 0.52 01/17/2017   CREATININE 0.50 01/16/2017   CREATININE 0.73 01/15/2017   GLU 98 09/13/2014   PROT 7.1 01/15/2017   BILITOT 0.9 01/15/2017   ALT 14 01/15/2017    AST 21 01/15/2017   ALKPHOS 31 (L) 01/15/2017    Other Imaging: Dg Chest 2 View  Result Date: 01/15/2017 CLINICAL DATA:  Umbilical abdominal pain. Vomiting x4 since December. Asthma. Former smoker. EXAM: CHEST  2 VIEW COMPARISON:  05/02/2015 CXR FINDINGS: The heart size and mediastinal contours are within normal limits. Aortic atherosclerosis without aneurysm. The lungs are hyperinflated consistent with history of asthma. No pulmonary consolidation, effusion or pneumothorax. No overt pulmonary edema. Axillary clips are seen on the left. Old fracture deformity noted of the right seventh rib. Chronic anterior wedge fracture of T3. IMPRESSION: 1. Hyperinflated lungs.  No acute pulmonary disease. 2. Aortic atherosclerosis. 3. Chronic T3 vertebral body compression. Old right seventh rib fracture. Electronically Signed   By: Ashley Royalty M.D.   On: 01/15/2017 20:25  Dg Abd 1 View  Result Date: 01/15/2017 CLINICAL DATA:  Nasogastric tube placement.  Initial encounter. EXAM: ABDOMEN - 1 VIEW COMPARISON:  CT of the abdomen and pelvis performed earlier today at 8:56 p.m. FINDINGS: The patient's enteric tube is noted ending overlying the body of the stomach. The visualized bowel gas pattern is unremarkable. Residual contrast is seen within small bowel loops. Previously noted small bowel obstruction is difficult to assess on this image. No free intra-abdominal air is identified, though evaluation for free air is limited on a single supine view. The visualized osseous structures are within normal limits; the sacroiliac joints are unremarkable in appearance. The visualized lung bases are essentially clear. IMPRESSION: Enteric tube noted ending overlying the body of the stomach. Electronically Signed   By: Garald Balding M.D.   On: 01/15/2017 23:57   Ct Abdomen Pelvis W Contrast  Result Date: 01/15/2017 CLINICAL DATA:  Lower abdominal pain for 4 months. EXAM: CT ABDOMEN AND PELVIS WITH CONTRAST TECHNIQUE:  Multidetector CT imaging of the abdomen and pelvis was performed using the standard protocol following bolus administration of intravenous contrast. CONTRAST:  135mL ISOVUE-300 IOPAMIDOL (ISOVUE-300) INJECTION 61% COMPARISON:  CT scan of October 25, 2009. FINDINGS: Lower chest: No acute abnormality. Hepatobiliary: Cholelithiasis is noted. No focal abnormality is noted in the liver. Pancreas: Unremarkable. No pancreatic ductal dilatation or surrounding inflammatory changes. Spleen: Normal in size without focal abnormality. Adrenals/Urinary Tract: Adrenal glands are unremarkable. Kidneys are normal, without renal calculi, focal lesion, or hydronephrosis. Bladder is unremarkable. Stomach/Bowel: Distal small bowel obstruction is noted with transition zone seen in the pelvis, best seen on image number 54 of series 2. Adjacent soft tissue density measuring 15 x 14 mm is noted concerning for peritoneal implant. Small sliding-type hiatal hernia is noted. Vascular/Lymphatic: Atherosclerosis of abdominal aorta is noted without aneurysm or dissection. 1 cm left periaortic lymph node is noted. Reproductive: Uterus is unremarkable. Ovaries are not well visualized. Other: Abnormal soft tissue density is seen involving the omentum in left lower quadrant consistent with peritoneal carcinomatosis. Small amount of free fluid is noted in dependent portion of pelvis. 19 x 16 mm soft tissue density is noted in left posterior pelvis concerning for peritoneal implant. Musculoskeletal: No acute or significant osseous findings. IMPRESSION: Cholelithiasis. Distal small bowel obstruction is noted with transition zone seen in the pelvis, most likely due to adjacent peritoneal implant or metastatic disease. Omental caking is seen in left lower quadrant, with another peritoneal implant seen in left posterior pelvis. This is concerning for peritoneal carcinomatosis. Aortic atherosclerosis. Small sliding-type hiatal hernia. Electronically Signed    By: Marijo Conception, M.D.   On: 01/15/2017 21:29     Assessment / Plan:   AUNESTY WITTEMAN is a 83 y.o. G1P1 who presents with small bowel obstruction.  1. Peritoneal arcinomatosis: Primary team is managing with plans for biopsy of peritoneal implants. Agree with tissue diagnosis and possible referral to GynOnc for management depending on results. With elevated CA125 ovarian etiology is likely but not assured. 2. SBO: Managed by surgery with NG tube with immediate relief for patient. They plan to remove if clinically indicated and follow conservatively. 3. Gyn will follow results for assistance in coordinating and managing care if malignancy of GYN origin. Pt aware. All questions answered.   Thank you for the opportunity to be involved with this pt's care.

## 2017-01-17 NOTE — Progress Notes (Signed)
Patient ID: Lisa Hayden, female   DOB: 1930/09/09, 83 y.o.   MRN: [id number]   Interventional Radiology:  CT reviewed.  Anterior omental caking approachable for CT guided biopsy in LLQ.  Risks and benefits discussed with the patient including, but not limited to bleeding, infection, damage to adjacent structures or low yield requiring additional tests. All of the patient's questions were answered, patient is agreeable to proceed. Consent signed and in chart.  Will proceed today with CT guided peritoneal mass biopsy with IV conscious sedation.  Venetia Night. Kathlene Cote, M.D Pager:  [phone]

## 2017-01-17 NOTE — Progress Notes (Signed)
CC: Bowel obstruction Subjective: Patient reports from a bowel standpoint that her abdominal pain has pretty much resolved. She denies any nausea. She is tolerating NG tube decompression. She has passed flatus.  Objective: Vital signs in last 24 hours: Temp:  [97.5 F (36.4 C)-98.3 F (36.8 C)] 97.8 F (36.6 C) (03/01 1330) Pulse Rate:  [73-89] 78 (03/01 0737) Resp:  [18] 18 (03/01 0524) BP: (130-150)/(56-72) 148/72 (03/01 1330) SpO2:  [94 %-96 %] 96 % (03/01 1330) Last BM Date: 01/15/17  Intake/Output from previous day: 02/28 0701 - 03/01 0700 In: 993 [I.V.:993] Out: 850 [Urine:550; Emesis/NG output:300] Intake/Output this shift: Total I/O In: 0  Out: 1050 [Urine:700; Emesis/NG output:350]  Physical exam:  Gen.: No acute distress Chest: Clear to auscultation Heart: Regular rhythm Abdomen: Soft and nontender  Lab Results: CBC   Recent Labs  01/16/17 0416 01/17/17 0524  WBC 9.9 7.3  HGB 13.5 13.2  HCT 38.7 38.1  PLT 270 241   BMET  Recent Labs  01/16/17 0416 01/17/17 0524  NA 139 139  K 3.9 3.6  CL 105 107  CO2 25 24  GLUCOSE 132* 81  BUN 14 12  CREATININE 0.50 0.52  CALCIUM 8.5* 7.9*   PT/INR  Recent Labs  01/15/17 2249  LABPROT 15.0  INR 1.17   ABG No results for input(s): PHART, HCO3 in the last 72 hours.  Invalid input(s): PCO2, PO2  Studies/Results: Dg Chest 2 View  Result Date: 01/15/2017 CLINICAL DATA:  Umbilical abdominal pain. Vomiting x4 since December. Asthma. Former smoker. EXAM: CHEST  2 VIEW COMPARISON:  05/02/2015 CXR FINDINGS: The heart size and mediastinal contours are within normal limits. Aortic atherosclerosis without aneurysm. The lungs are hyperinflated consistent with history of asthma. No pulmonary consolidation, effusion or pneumothorax. No overt pulmonary edema. Axillary clips are seen on the left. Old fracture deformity noted of the right seventh rib. Chronic anterior wedge fracture of T3. IMPRESSION: 1.  Hyperinflated lungs.  No acute pulmonary disease. 2. Aortic atherosclerosis. 3. Chronic T3 vertebral body compression. Old right seventh rib fracture. Electronically Signed   By: Ashley Royalty M.D.   On: 01/15/2017 20:25   Dg Abd 1 View  Result Date: 01/15/2017 CLINICAL DATA:  Nasogastric tube placement.  Initial encounter. EXAM: ABDOMEN - 1 VIEW COMPARISON:  CT of the abdomen and pelvis performed earlier today at 8:56 p.m. FINDINGS: The patient's enteric tube is noted ending overlying the body of the stomach. The visualized bowel gas pattern is unremarkable. Residual contrast is seen within small bowel loops. Previously noted small bowel obstruction is difficult to assess on this image. No free intra-abdominal air is identified, though evaluation for free air is limited on a single supine view. The visualized osseous structures are within normal limits; the sacroiliac joints are unremarkable in appearance. The visualized lung bases are essentially clear. IMPRESSION: Enteric tube noted ending overlying the body of the stomach. Electronically Signed   By: Garald Balding M.D.   On: 01/15/2017 23:57   Ct Abdomen Pelvis W Contrast  Result Date: 01/15/2017 CLINICAL DATA:  Lower abdominal pain for 4 months. EXAM: CT ABDOMEN AND PELVIS WITH CONTRAST TECHNIQUE: Multidetector CT imaging of the abdomen and pelvis was performed using the standard protocol following bolus administration of intravenous contrast. CONTRAST:  17mL ISOVUE-300 IOPAMIDOL (ISOVUE-300) INJECTION 61% COMPARISON:  CT scan of October 25, 2009. FINDINGS: Lower chest: No acute abnormality. Hepatobiliary: Cholelithiasis is noted. No focal abnormality is noted in the liver. Pancreas: Unremarkable. No pancreatic ductal dilatation  or surrounding inflammatory changes. Spleen: Normal in size without focal abnormality. Adrenals/Urinary Tract: Adrenal glands are unremarkable. Kidneys are normal, without renal calculi, focal lesion, or hydronephrosis. Bladder  is unremarkable. Stomach/Bowel: Distal small bowel obstruction is noted with transition zone seen in the pelvis, best seen on image number 54 of series 2. Adjacent soft tissue density measuring 15 x 14 mm is noted concerning for peritoneal implant. Small sliding-type hiatal hernia is noted. Vascular/Lymphatic: Atherosclerosis of abdominal aorta is noted without aneurysm or dissection. 1 cm left periaortic lymph node is noted. Reproductive: Uterus is unremarkable. Ovaries are not well visualized. Other: Abnormal soft tissue density is seen involving the omentum in left lower quadrant consistent with peritoneal carcinomatosis. Small amount of free fluid is noted in dependent portion of pelvis. 19 x 16 mm soft tissue density is noted in left posterior pelvis concerning for peritoneal implant. Musculoskeletal: No acute or significant osseous findings. IMPRESSION: Cholelithiasis. Distal small bowel obstruction is noted with transition zone seen in the pelvis, most likely due to adjacent peritoneal implant or metastatic disease. Omental caking is seen in left lower quadrant, with another peritoneal implant seen in left posterior pelvis. This is concerning for peritoneal carcinomatosis. Aortic atherosclerosis. Small sliding-type hiatal hernia. Electronically Signed   By: Marijo Conception, M.D.   On: 01/15/2017 21:29    Anti-infectives: Anti-infectives    None      Assessment/Plan:  83 year old female admitted with a small bowel traction. This appears to be resolving. NG tube clamped this morning. If does well with NG tube clamped to possibly remove it later today versus tomorrow morning. Patient also being worked up for carcinomatosis. Discussed with radiology who performing a percutaneous biopsy later today. Surgery will continue to follow with you.  Evelyn Moch T. Adonis Huguenin, MD, [facility] General Surgeon [facility]  Day [facility] [phone] Night [facility] [phone] 01/17/2017

## 2017-01-18 DIAGNOSIS — E43 Unspecified severe protein-calorie malnutrition: Secondary | ICD-10-CM | POA: Insufficient documentation

## 2017-01-18 NOTE — Care Management Important Message (Signed)
Important Message  Patient Details  Name: Lisa Hayden MRN: [id number] Date of Birth: 06-03-1930   Medicare Important Message Given:  Yes    Beverly Sessions, RN 01/18/2017, 12:43 PM

## 2017-01-18 NOTE — Progress Notes (Signed)
[facility]                                                                                                                                                                                  Patient Demographics   Lisa Hayden, is a 83 y.o. female, DOB - 06/13/1930, [id number]  Admit date - 01/15/2017   Admitting Physician Lance Coon, MD  Outpatient Primary MD for the patient is Lelon Huh, MD   LOS - 2  Subjective:  Patient complains of some abdominal discomfort. Was seen by surgery NG-tube is been discontinued she is recommending clear liquid diet. Patient not has not had any bowel movement or passed pass gas   Review of Systems:   CONSTITUTIONAL: No documented fever. No fatigue, weakness. No weight gain, no weight loss.  EYES: No blurry or double vision.  ENT: No tinnitus. No postnasal drip. No redness of the oropharynx.  RESPIRATORY: No cough, no wheeze, no hemoptysis. No dyspnea.  CARDIOVASCULAR: No chest pain. No orthopnea. No palpitations. No syncope.  GASTROINTESTINAL:Resolved nausea, no vomiting or diarrhea. Positive abdominal pain. No melena or hematochezia.  GENITOURINARY: No dysuria or hematuria.  ENDOCRINE: No polyuria or nocturia. No heat or cold intolerance.  HEMATOLOGY: No anemia. No bruising. No bleeding.  INTEGUMENTARY: No rashes. No lesions.  MUSCULOSKELETAL: No arthritis. No swelling. No gout.  NEUROLOGIC: No numbness, tingling, or ataxia. No seizure-type activity.  PSYCHIATRIC: No anxiety. No insomnia. No ADD.    Vitals:   Vitals:   01/17/17 2042 01/18/17 0520 01/18/17 0741 01/18/17 1234  BP: (!) 166/66 (!) 159/86 (!) 161/96 (!) 138/97  Pulse: 83 82 82 85  Resp: 20 19 (!) 22   Temp: 97.5 F (36.4 C) 97.9 F (36.6 C) 97.4 F (36.3 C) 97.9 F (36.6 C)  TempSrc: Oral Oral Oral Oral  SpO2: 96% 99% 97% 97%  Weight:      Height:        Wt Readings from Last 3 Encounters:  01/15/17 126 lb (57.2 kg)   11/07/16 135 lb (61.2 kg)  05/08/16 137 lb (62.1 kg)     Intake/Output Summary (Last 24 hours) at 01/18/17 1257 Last data filed at 01/18/17 1120  Gross per 24 hour  Intake              621 ml  Output             1550 ml  Net             -929 ml    Physical Exam:   GENERAL: Pleasant-appearing in no apparent distress.  HEAD, EYES, EARS, NOSE AND THROAT: Atraumatic, normocephalic. Extraocular muscles  are intact. Pupils equal and reactive to light. Sclerae anicteric. No conjunctival injection. No oro-pharyngeal erythema.  NECK: Supple. There is no jugular venous distention. No bruits, no lymphadenopathy, no thyromegaly.  HEART: Regular rate and rhythm,. No murmurs, no rubs, no clicks.  LUNGS: Clear to auscultation bilaterally. No rales or rhonchi. No wheezes.  ABDOMEN: Soft, , no bowel sounds there is no guarding no rebound mild distention EXTREMITIES: No evidence of any cyanosis, clubbing, or peripheral edema.  +2 pedal and radial pulses bilaterally.  NEUROLOGIC: The patient is alert, awake, and oriented x3 with no focal motor or sensory deficits appreciated bilaterally.  SKIN: Moist and warm with no rashes appreciated.  Psych: Not anxious, depressed LN: No inguinal LN enlargement    Antibiotics   Anti-infectives    None      Medications   Scheduled Meds: . enoxaparin (LOVENOX) injection  40 mg Subcutaneous Daily   Continuous Infusions: . sodium chloride    . sodium chloride 100 mL/hr at 01/18/17 0034   PRN Meds:.acetaminophen **OR** acetaminophen, labetalol, morphine injection, ondansetron **OR** ondansetron (ZOFRAN) IV   Data Review:   Micro Results No results found for this or any previous visit (from the past 240 hour(s)).  Radiology Reports Dg Chest 2 View  Result Date: 01/15/2017 CLINICAL DATA:  Umbilical abdominal pain. Vomiting x4 since December. Asthma. Former smoker. EXAM: CHEST  2 VIEW COMPARISON:  05/02/2015 CXR FINDINGS: The heart size and  mediastinal contours are within normal limits. Aortic atherosclerosis without aneurysm. The lungs are hyperinflated consistent with history of asthma. No pulmonary consolidation, effusion or pneumothorax. No overt pulmonary edema. Axillary clips are seen on the left. Old fracture deformity noted of the right seventh rib. Chronic anterior wedge fracture of T3. IMPRESSION: 1. Hyperinflated lungs.  No acute pulmonary disease. 2. Aortic atherosclerosis. 3. Chronic T3 vertebral body compression. Old right seventh rib fracture. Electronically Signed   By: Ashley Royalty M.D.   On: 01/15/2017 20:25   Dg Abd 1 View  Result Date: 01/15/2017 CLINICAL DATA:  Nasogastric tube placement.  Initial encounter. EXAM: ABDOMEN - 1 VIEW COMPARISON:  CT of the abdomen and pelvis performed earlier today at 8:56 p.m. FINDINGS: The patient's enteric tube is noted ending overlying the body of the stomach. The visualized bowel gas pattern is unremarkable. Residual contrast is seen within small bowel loops. Previously noted small bowel obstruction is difficult to assess on this image. No free intra-abdominal air is identified, though evaluation for free air is limited on a single supine view. The visualized osseous structures are within normal limits; the sacroiliac joints are unremarkable in appearance. The visualized lung bases are essentially clear. IMPRESSION: Enteric tube noted ending overlying the body of the stomach. Electronically Signed   By: Garald Balding M.D.   On: 01/15/2017 23:57   Ct Abdomen Pelvis W Contrast  Result Date: 01/15/2017 CLINICAL DATA:  Lower abdominal pain for 4 months. EXAM: CT ABDOMEN AND PELVIS WITH CONTRAST TECHNIQUE: Multidetector CT imaging of the abdomen and pelvis was performed using the standard protocol following bolus administration of intravenous contrast. CONTRAST:  19mL ISOVUE-300 IOPAMIDOL (ISOVUE-300) INJECTION 61% COMPARISON:  CT scan of October 25, 2009. FINDINGS: Lower chest: No acute  abnormality. Hepatobiliary: Cholelithiasis is noted. No focal abnormality is noted in the liver. Pancreas: Unremarkable. No pancreatic ductal dilatation or surrounding inflammatory changes. Spleen: Normal in size without focal abnormality. Adrenals/Urinary Tract: Adrenal glands are unremarkable. Kidneys are normal, without renal calculi, focal lesion, or hydronephrosis. Bladder is unremarkable. Stomach/Bowel:  Distal small bowel obstruction is noted with transition zone seen in the pelvis, best seen on image number 54 of series 2. Adjacent soft tissue density measuring 15 x 14 mm is noted concerning for peritoneal implant. Small sliding-type hiatal hernia is noted. Vascular/Lymphatic: Atherosclerosis of abdominal aorta is noted without aneurysm or dissection. 1 cm left periaortic lymph node is noted. Reproductive: Uterus is unremarkable. Ovaries are not well visualized. Other: Abnormal soft tissue density is seen involving the omentum in left lower quadrant consistent with peritoneal carcinomatosis. Small amount of free fluid is noted in dependent portion of pelvis. 19 x 16 mm soft tissue density is noted in left posterior pelvis concerning for peritoneal implant. Musculoskeletal: No acute or significant osseous findings. IMPRESSION: Cholelithiasis. Distal small bowel obstruction is noted with transition zone seen in the pelvis, most likely due to adjacent peritoneal implant or metastatic disease. Omental caking is seen in left lower quadrant, with another peritoneal implant seen in left posterior pelvis. This is concerning for peritoneal carcinomatosis. Aortic atherosclerosis. Small sliding-type hiatal hernia. Electronically Signed   By: Marijo Conception, M.D.   On: 01/15/2017 21:29   Ct Biopsy  Result Date: 01/17/2017 CLINICAL DATA:  Partial small bowel obstruction and CT evidence of peritoneal carcinomatosis with anterior peritoneal omental caking. CT-guided biopsy is performed of tumor. EXAM: CT GUIDED CORE  BIOPSY OF PERITONEAL MASS ANESTHESIA/SEDATION: 2.0 mg IV Versed; 50 mcg IV Fentanyl Total Moderate Sedation Time:  11 minutes. The patient's level of consciousness and physiologic status were continuously monitored during the procedure by Radiology nursing. PROCEDURE: The procedure risks, benefits, and alternatives were explained to the patient. Questions regarding the procedure were encouraged and answered. The patient understands and consents to the procedure. A time-out was performed prior to initiating the procedure. The anterior abdominal wall was prepped with chlorhexidine in a sterile fashion, and a sterile drape was applied covering the operative field. A sterile gown and sterile gloves were used for the procedure. Local anesthesia was provided with 1% Lidocaine. CT was performed in a supine position. After choosing a site for biopsy, a 44 gauge trocar needle was advanced from an anterior oblique approach to the level of the left anterior peritoneal cavity. A total of 3 separate 18 gauge core biopsy samples were then obtained and placed in formalin. Additional post biopsy CT images were performed. COMPLICATIONS: None FINDINGS: Tumor in the anterior peritoneal cavity immediately deep to the abdominal wall of the left lower quadrant was targeted. Solid tissue samples were obtained. IMPRESSION: CT-guided core biopsy performed of peritoneal tumor within the anterior left lower peritoneal cavity. Electronically Signed   By: Aletta Edouard M.D.   On: 01/17/2017 16:21     CBC  Recent Labs Lab 01/15/17 1939 01/16/17 0416 01/17/17 0524  WBC 6.3 9.9 7.3  HGB 14.1 13.5 13.2  HCT 42.2 38.7 38.1  PLT 283 270 241  MCV 88.2 87.2 88.2  MCH 29.5 30.4 30.6  MCHC 33.5 34.8 34.7  RDW 13.6 13.4 13.4    Chemistries   Recent Labs Lab 01/15/17 1939 01/16/17 0416 01/17/17 0524  NA 134* 139 139  K 3.9 3.9 3.6  CL 102 105 107  CO2 26 25 24   GLUCOSE 162* 132* 81  BUN 17 14 12   CREATININE 0.73 0.50 0.52   CALCIUM 9.2 8.5* 7.9*  AST 21  --   --   ALT 14  --   --   ALKPHOS 31*  --   --   BILITOT 0.9  --   --    ------------------------------------------------------------------------------------------------------------------  estimated creatinine clearance is 41.4 mL/min (by C-G formula based on SCr of 0.52 mg/dL). ------------------------------------------------------------------------------------------------------------------ No results for input(s): HGBA1C in the last 72 hours. ------------------------------------------------------------------------------------------------------------------ No results for input(s): CHOL, HDL, LDLCALC, TRIG, CHOLHDL, LDLDIRECT in the last 72 hours. ------------------------------------------------------------------------------------------------------------------ No results for input(s): TSH, T4TOTAL, T3FREE, THYROIDAB in the last 72 hours.  Invalid input(s): FREET3 ------------------------------------------------------------------------------------------------------------------ No results for input(s): VITAMINB12, FOLATE, FERRITIN, TIBC, IRON, RETICCTPCT in the last 72 hours.  Coagulation profile  Recent Labs Lab 01/15/17 2249  INR 1.17    No results for input(s): DDIMER in the last 72 hours.  Cardiac Enzymes  Recent Labs Lab 01/15/17 1939  TROPONINI <0.03   ------------------------------------------------------------------------------------------------------------------ Invalid input(s): POCBNP    Assessment & Plan  Patient's 83 year old admitted with small bowel obstruction  1.   Small bowel obstruction - I discussed with Dr. Hortencia Conradi of surgery - he recommends advancing diet he states that clinically he feels that the small bowel is resolved I will give patient a suppository Monitor for any further nausea vomiting or abdominal discomfort  2. Peritoneal carcinomatosis [name]) - appreciate oncology and gynecology consulted, CT scan does  not show any clear primary cancerous process Patient had a recent endometrial biopsy due to vaginal bleeding, biopsy results negative Seen by GYN M.D. C1 29 is elevated suggestive ovarian pathology Status post biopsy suspect pathology results will not be back until at least Monday  3.   hypertension use when necessary IV labetalol 4. Miscellaneous Lovenox for DVT prophylaxis    Code Status Orders        Start     Ordered   01/16/17 0155  Full code  Continuous     01/16/17 0154    Code Status History    Date Active Date Inactive Code Status Order ID Comments User Context   This patient has a current code status but no historical code status.    Advance Directive Documentation   Flowsheet Row Most Recent Value  Type of Advance Directive  Healthcare Power of Attorney, Living will  Pre-existing out of facility DNR order (yellow form or pink MOST form)  No data  "MOST" Form in Place?  No data           Consults  Gyn, surgery, oncology  DVT Prophylaxis  Lovenox   Lab Results  Component Value Date   PLT 241 01/17/2017     Time Spent in minutes  67min  Greater than 50% of time spent in care coordination and counseling patient regarding the condition and plan of care.   Dustin Flock M.D on 01/18/2017 at 12:57 PM  Between 7am to 6pm - Pager - [phone]  After 6pm go to [email or web address] - password [email or web address] [email or web address]   Office  [phone]

## 2017-01-18 NOTE — Progress Notes (Signed)
CC: Small bowel obstruction Subjective: NG tube was removed last night. Patient thinks she had some grumbling is in her stomach but denies nausea or vomiting. States she is thirsty. She passed flatus yesterday but does not think she passed any today. Ambulated yesterday but has not ambulated today.  Objective: Vital signs in last 24 hours: Temp:  [97.4 F (36.3 C)-97.9 F (36.6 C)] 97.4 F (36.3 C) (03/02 0741) Pulse Rate:  [81-98] 82 (03/02 0741) Resp:  [17-22] 22 (03/02 0741) BP: (133-166)/(61-96) 161/96 (03/02 0741) SpO2:  [93 %-99 %] 97 % (03/02 0741) Last BM Date: 01/15/17  Intake/Output from previous day: 03/01 0701 - 03/02 0700 In: 621 [I.V.:621] Out: 2050 [Urine:1700; Emesis/NG output:350] Intake/Output this shift: Total I/O In: -  Out: 550 [Urine:550]  Physical exam:  Gen.: No acute distress Chest: Clear to auscultation Heart: Regular rhythm Abdomen: Soft and nontender  Lab Results: CBC   Recent Labs  01/16/17 0416 01/17/17 0524  WBC 9.9 7.3  HGB 13.5 13.2  HCT 38.7 38.1  PLT 270 241   BMET  Recent Labs  01/16/17 0416 01/17/17 0524  NA 139 139  K 3.9 3.6  CL 105 107  CO2 25 24  GLUCOSE 132* 81  BUN 14 12  CREATININE 0.50 0.52  CALCIUM 8.5* 7.9*   PT/INR  Recent Labs  01/15/17 2249  LABPROT 15.0  INR 1.17   ABG No results for input(s): PHART, HCO3 in the last 72 hours.  Invalid input(s): PCO2, PO2  Studies/Results: Ct Biopsy  Result Date: 01/17/2017 CLINICAL DATA:  Partial small bowel obstruction and CT evidence of peritoneal carcinomatosis with anterior peritoneal omental caking. CT-guided biopsy is performed of tumor. EXAM: CT GUIDED CORE BIOPSY OF PERITONEAL MASS ANESTHESIA/SEDATION: 2.0 mg IV Versed; 50 mcg IV Fentanyl Total Moderate Sedation Time:  11 minutes. The patient's level of consciousness and physiologic status were continuously monitored during the procedure by Radiology nursing. PROCEDURE: The procedure risks, benefits,  and alternatives were explained to the patient. Questions regarding the procedure were encouraged and answered. The patient understands and consents to the procedure. A time-out was performed prior to initiating the procedure. The anterior abdominal wall was prepped with chlorhexidine in a sterile fashion, and a sterile drape was applied covering the operative field. A sterile gown and sterile gloves were used for the procedure. Local anesthesia was provided with 1% Lidocaine. CT was performed in a supine position. After choosing a site for biopsy, a 50 gauge trocar needle was advanced from an anterior oblique approach to the level of the left anterior peritoneal cavity. A total of 3 separate 18 gauge core biopsy samples were then obtained and placed in formalin. Additional post biopsy CT images were performed. COMPLICATIONS: None FINDINGS: Tumor in the anterior peritoneal cavity immediately deep to the abdominal wall of the left lower quadrant was targeted. Solid tissue samples were obtained. IMPRESSION: CT-guided core biopsy performed of peritoneal tumor within the anterior left lower peritoneal cavity. Electronically Signed   By: Aletta Edouard M.D.   On: 01/17/2017 16:21    Anti-infectives: Anti-infectives    None      Assessment/Plan:  83 year old female admitted for a small bowel obstruction and with a question of carcinomatosis on CT scan. Had a CT-guided biopsy yesterday. Started on a clear liquid diet today as she tolerated NG tube removal yesterday. Plan to evaluate later today if she tolerates her clear liquids or whether not she can be advanced. Encourage ambulation and incentive spirometer usage.  Juanda Crumble  Angus Palms, MD, [facility] General Surgeon [facility]  Day [facility] [phone] Night [facility] [phone] 01/18/2017

## 2017-01-18 NOTE — Progress Notes (Signed)
s/p CT guided biopsy yesterday   Subjective: The patient is doing well.   NG tube removed last night. Tolerating a clear breakfast this morning, with minimal nausea and no vomiting. Pain is adequately controlled. Not passing flatus.  Objective: Vital signs in last 24 hours: Temp:  [97.4 F (36.3 C)-97.9 F (36.6 C)] 97.4 F (36.3 C) (03/02 0741) Pulse Rate:  [81-98] 82 (03/02 0741) Resp:  [17-22] 22 (03/02 0741) BP: (133-166)/(61-96) 161/96 (03/02 0741) SpO2:  [93 %-99 %] 97 % (03/02 0741)  Intake/Output  Intake/Output Summary (Last 24 hours) at 01/18/17 1042 Last data filed at 01/18/17 0900  Gross per 24 hour  Intake              621 ml  Output             1550 ml  Net             -929 ml    Physical Exam:  General: Alert and oriented. CV: RRR Lungs: Clear bilaterally. GI: Soft, Nondistended. Incisions: Clean and dry. Extremities: Nontender, no erythema, no edema.  Lab Results:  Recent Labs  01/15/17 1939 01/16/17 0416 01/17/17 0524  HGB 14.1 13.5 13.2  HCT 42.2 38.7 38.1  WBC 6.3 9.9 7.3  PLT 283 270 241                No results found for this or any previous visit (from the past 24 hour(s)).  Assessment/Plan:  1. 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 F/u biopsy results. Agree with Lovenox 2. SBO- care per surgery team. Planned Advance diet as tolerated. Encourage ambulation.  Benjaman Kindler, MD   LOS: 2 days   Benjaman Kindler 01/18/2017, 10:42 AM

## 2017-01-19 LAB — CBC
HEMATOCRIT: 36.3 % (ref 35.0–47.0)
HEMOGLOBIN: 12.3 g/dL (ref 12.0–16.0)
MCH: 29.8 pg (ref 26.0–34.0)
MCHC: 33.8 g/dL (ref 32.0–36.0)
MCV: 88.1 fL (ref 80.0–100.0)
Platelets: 234 10*3/uL (ref 150–440)
RBC: 4.12 MIL/uL (ref 3.80–5.20)
RDW: 13.3 % (ref 11.5–14.5)
WBC: 4.4 10*3/uL (ref 3.6–11.0)

## 2017-01-19 LAB — BASIC METABOLIC PANEL
Anion gap: 7 (ref 5–15)
BUN: 10 mg/dL (ref 6–20)
CHLORIDE: 109 mmol/L (ref 101–111)
CO2: 21 mmol/L — ABNORMAL LOW (ref 22–32)
Calcium: 7.4 mg/dL — ABNORMAL LOW (ref 8.9–10.3)
Creatinine, Ser: 0.47 mg/dL (ref 0.44–1.00)
GFR calc Af Amer: >60 mL/min (ref >60)
GFR calc non Af Amer: >60 mL/min (ref >60)
Glucose, Bld: 102 mg/dL — ABNORMAL HIGH (ref 65–99)
POTASSIUM: 3.3 mmol/L — AB (ref 3.5–5.1)
SODIUM: 137 mmol/L (ref 135–145)

## 2017-01-19 MED ORDER — PROMETHAZINE HCL 25 MG/ML IJ SOLN: 12.5000 mg | Freq: Four times a day (QID) | INTRAMUSCULAR | Status: DC | PRN

## 2017-01-19 MED ORDER — POTASSIUM CHLORIDE CRYS ER 20 MEQ PO TBCR
20.0000 meq | EXTENDED_RELEASE_TABLET | Freq: Once | ORAL | Status: AC
  Administered 2017-01-19: 20 meq via ORAL
  Filled 2017-01-19: qty 1

## 2017-01-19 MED ORDER — PROMETHAZINE HCL 25 MG/ML IJ SOLN
12.5000 mg | Freq: Four times a day (QID) | INTRAMUSCULAR | Status: DC | PRN
  Administered 2017-01-19 – 2017-01-20 (×3): 12.5 mg via INTRAVENOUS
  Filled 2017-01-19 (×3): qty 1

## 2017-01-19 MED ORDER — ALUM & MAG HYDROXIDE-SIMETH 200-200-20 MG/5ML PO SUSP
30.0000 mL | ORAL | Status: DC | PRN
  Administered 2017-01-19: 30 mL via ORAL
  Filled 2017-01-19: qty 30

## 2017-01-19 NOTE — Progress Notes (Signed)
Pharmacist-Provider Communication  Promethazine 12.5-25mg  Q6hr PRN ordered. Per [employer] Policy, for patients age greater than 81yo, promethazine should not exceed doses greater than 12.5mg .  Loree Fee, PharmD 1:10 PM 01/19/2017

## 2017-01-19 NOTE — Progress Notes (Signed)
[facility]                                                                                                                                                                                  Patient Demographics   Lisa Hayden, is a 83 y.o. female, DOB - 07-04-1930, [id number]  Admit date - 01/15/2017   Admitting Physician Lance Coon, MD  Outpatient Primary MD for the patient is Lelon Huh, MD   LOS - 3  Subjective:  Patient complains of abdominal pain , had emesis earlier today. States that passing gas manually disimpacted herself with a small amount of stool  Review of Systems:   CONSTITUTIONAL: No documented fever. No fatigue, weakness. No weight gain, no weight loss.  EYES: No blurry or double vision.  ENT: No tinnitus. No postnasal drip. No redness of the oropharynx.  RESPIRATORY: No cough, no wheeze, no hemoptysis. No dyspnea.  CARDIOVASCULAR: No chest pain. No orthopnea. No palpitations. No syncope.  GASTROINTESTINAL:Positive nausea, positive vomiting or diarrhea. Positive abdominal pain. No melena or hematochezia.  GENITOURINARY: No dysuria or hematuria.  ENDOCRINE: No polyuria or nocturia. No heat or cold intolerance.  HEMATOLOGY: No anemia. No bruising. No bleeding.  INTEGUMENTARY: No rashes. No lesions.  MUSCULOSKELETAL: No arthritis. No swelling. No gout.  NEUROLOGIC: No numbness, tingling, or ataxia. No seizure-type activity.  PSYCHIATRIC: No anxiety. No insomnia. No ADD.    Vitals:   Vitals:   01/18/17 0741 01/18/17 1234 01/18/17 2025 01/19/17 0446  BP: (!) 161/96 (!) 138/97 (!) 142/63 (!) 154/73  Pulse: 82 85 83 78  Resp: (!) 22  20 18   Temp: 97.4 F (36.3 C) 97.9 F (36.6 C) 98.4 F (36.9 C) 98.2 F (36.8 C)  TempSrc: Oral Oral Oral Oral  SpO2: 97% 97% 97% 97%  Weight:      Height:        Wt Readings from Last 3 Encounters:  01/15/17 126 lb (57.2 kg)  11/07/16 135 lb (61.2 kg)  05/08/16 137 lb (62.1  kg)     Intake/Output Summary (Last 24 hours) at 01/19/17 1129 Last data filed at 01/19/17 0945  Gross per 24 hour  Intake          4781.67 ml  Output             1000 ml  Net          3781.67 ml    Physical Exam:   GENERAL: Pleasant-appearing in no apparent distress.  HEAD, EYES, EARS, NOSE AND THROAT: Atraumatic, normocephalic. Extraocular muscles are intact. Pupils equal and reactive to light. Sclerae anicteric. No conjunctival injection. No oro-pharyngeal erythema.  NECK: Supple. There is no jugular venous distention. No bruits, no lymphadenopathy, no thyromegaly.  HEART: Regular rate and rhythm,. No murmurs, no rubs, no clicks.  LUNGS: Clear to auscultation bilaterally. No rales or rhonchi. No wheezes.  ABDOMEN: Soft, , no bowel sounds there is no guarding no rebound mild distention EXTREMITIES: No evidence of any cyanosis, clubbing, or peripheral edema.  +2 pedal and radial pulses bilaterally.  NEUROLOGIC: The patient is alert, awake, and oriented x3 with no focal motor or sensory deficits appreciated bilaterally.  SKIN: Moist and warm with no rashes appreciated.  Psych: Not anxious, depressed LN: No inguinal LN enlargement    Antibiotics   Anti-infectives    None      Medications   Scheduled Meds: . enoxaparin (LOVENOX) injection  40 mg Subcutaneous Daily   Continuous Infusions: . sodium chloride    . sodium chloride 100 mL/hr at 01/19/17 1013   PRN Meds:.acetaminophen **OR** acetaminophen, labetalol, morphine injection, ondansetron **OR** ondansetron (ZOFRAN) IV   Data Review:   Micro Results No results found for this or any previous visit (from the past 240 hour(s)).  Radiology Reports Dg Chest 2 View  Result Date: 01/15/2017 CLINICAL DATA:  Umbilical abdominal pain. Vomiting x4 since December. Asthma. Former smoker. EXAM: CHEST  2 VIEW COMPARISON:  05/02/2015 CXR FINDINGS: The heart size and mediastinal contours are within normal limits. Aortic  atherosclerosis without aneurysm. The lungs are hyperinflated consistent with history of asthma. No pulmonary consolidation, effusion or pneumothorax. No overt pulmonary edema. Axillary clips are seen on the left. Old fracture deformity noted of the right seventh rib. Chronic anterior wedge fracture of T3. IMPRESSION: 1. Hyperinflated lungs.  No acute pulmonary disease. 2. Aortic atherosclerosis. 3. Chronic T3 vertebral body compression. Old right seventh rib fracture. Electronically Signed   By: Ashley Royalty M.D.   On: 01/15/2017 20:25   Dg Abd 1 View  Result Date: 01/15/2017 CLINICAL DATA:  Nasogastric tube placement.  Initial encounter. EXAM: ABDOMEN - 1 VIEW COMPARISON:  CT of the abdomen and pelvis performed earlier today at 8:56 p.m. FINDINGS: The patient's enteric tube is noted ending overlying the body of the stomach. The visualized bowel gas pattern is unremarkable. Residual contrast is seen within small bowel loops. Previously noted small bowel obstruction is difficult to assess on this image. No free intra-abdominal air is identified, though evaluation for free air is limited on a single supine view. The visualized osseous structures are within normal limits; the sacroiliac joints are unremarkable in appearance. The visualized lung bases are essentially clear. IMPRESSION: Enteric tube noted ending overlying the body of the stomach. Electronically Signed   By: Garald Balding M.D.   On: 01/15/2017 23:57   Ct Abdomen Pelvis W Contrast  Result Date: 01/15/2017 CLINICAL DATA:  Lower abdominal pain for 4 months. EXAM: CT ABDOMEN AND PELVIS WITH CONTRAST TECHNIQUE: Multidetector CT imaging of the abdomen and pelvis was performed using the standard protocol following bolus administration of intravenous contrast. CONTRAST:  1104mL ISOVUE-300 IOPAMIDOL (ISOVUE-300) INJECTION 61% COMPARISON:  CT scan of October 25, 2009. FINDINGS: Lower chest: No acute abnormality. Hepatobiliary: Cholelithiasis is noted. No  focal abnormality is noted in the liver. Pancreas: Unremarkable. No pancreatic ductal dilatation or surrounding inflammatory changes. Spleen: Normal in size without focal abnormality. Adrenals/Urinary Tract: Adrenal glands are unremarkable. Kidneys are normal, without renal calculi, focal lesion, or hydronephrosis. Bladder is unremarkable. Stomach/Bowel: Distal small bowel obstruction is noted with transition zone seen in the pelvis, best seen on image  number 54 of series 2. Adjacent soft tissue density measuring 15 x 14 mm is noted concerning for peritoneal implant. Small sliding-type hiatal hernia is noted. Vascular/Lymphatic: Atherosclerosis of abdominal aorta is noted without aneurysm or dissection. 1 cm left periaortic lymph node is noted. Reproductive: Uterus is unremarkable. Ovaries are not well visualized. Other: Abnormal soft tissue density is seen involving the omentum in left lower quadrant consistent with peritoneal carcinomatosis. Small amount of free fluid is noted in dependent portion of pelvis. 19 x 16 mm soft tissue density is noted in left posterior pelvis concerning for peritoneal implant. Musculoskeletal: No acute or significant osseous findings. IMPRESSION: Cholelithiasis. Distal small bowel obstruction is noted with transition zone seen in the pelvis, most likely due to adjacent peritoneal implant or metastatic disease. Omental caking is seen in left lower quadrant, with another peritoneal implant seen in left posterior pelvis. This is concerning for peritoneal carcinomatosis. Aortic atherosclerosis. Small sliding-type hiatal hernia. Electronically Signed   By: Marijo Conception, M.D.   On: 01/15/2017 21:29   Ct Biopsy  Result Date: 01/17/2017 CLINICAL DATA:  Partial small bowel obstruction and CT evidence of peritoneal carcinomatosis with anterior peritoneal omental caking. CT-guided biopsy is performed of tumor. EXAM: CT GUIDED CORE BIOPSY OF PERITONEAL MASS ANESTHESIA/SEDATION: 2.0 mg IV  Versed; 50 mcg IV Fentanyl Total Moderate Sedation Time:  11 minutes. The patient's level of consciousness and physiologic status were continuously monitored during the procedure by Radiology nursing. PROCEDURE: The procedure risks, benefits, and alternatives were explained to the patient. Questions regarding the procedure were encouraged and answered. The patient understands and consents to the procedure. A time-out was performed prior to initiating the procedure. The anterior abdominal wall was prepped with chlorhexidine in a sterile fashion, and a sterile drape was applied covering the operative field. A sterile gown and sterile gloves were used for the procedure. Local anesthesia was provided with 1% Lidocaine. CT was performed in a supine position. After choosing a site for biopsy, a 71 gauge trocar needle was advanced from an anterior oblique approach to the level of the left anterior peritoneal cavity. A total of 3 separate 18 gauge core biopsy samples were then obtained and placed in formalin. Additional post biopsy CT images were performed. COMPLICATIONS: None FINDINGS: Tumor in the anterior peritoneal cavity immediately deep to the abdominal wall of the left lower quadrant was targeted. Solid tissue samples were obtained. IMPRESSION: CT-guided core biopsy performed of peritoneal tumor within the anterior left lower peritoneal cavity. Electronically Signed   By: Aletta Edouard M.D.   On: 01/17/2017 16:21     CBC  Recent Labs Lab 01/15/17 1939 01/16/17 0416 01/17/17 0524 01/19/17 0338  WBC 6.3 9.9 7.3 4.4  HGB 14.1 13.5 13.2 12.3  HCT 42.2 38.7 38.1 36.3  PLT 283 270 241 234  MCV 88.2 87.2 88.2 88.1  MCH 29.5 30.4 30.6 29.8  MCHC 33.5 34.8 34.7 33.8  RDW 13.6 13.4 13.4 13.3    Chemistries   Recent Labs Lab 01/15/17 1939 01/16/17 0416 01/17/17 0524 01/19/17 0338  NA 134* 139 139 137  K 3.9 3.9 3.6 3.3*  CL 102 105 107 109  CO2 26 25 24  21*  GLUCOSE 162* 132* 81 102*  BUN 17  14 12 10   CREATININE 0.73 0.50 0.52 0.47  CALCIUM 9.2 8.5* 7.9* 7.4*  AST 21  --   --   --   ALT 14  --   --   --   ALKPHOS 31*  --   --   --  BILITOT 0.9  --   --   --    ------------------------------------------------------------------------------------------------------------------ estimated creatinine clearance is 41.4 mL/min (by C-G formula based on SCr of 0.47 mg/dL). ------------------------------------------------------------------------------------------------------------------ No results for input(s): HGBA1C in the last 72 hours. ------------------------------------------------------------------------------------------------------------------ No results for input(s): CHOL, HDL, LDLCALC, TRIG, CHOLHDL, LDLDIRECT in the last 72 hours. ------------------------------------------------------------------------------------------------------------------ No results for input(s): TSH, T4TOTAL, T3FREE, THYROIDAB in the last 72 hours.  Invalid input(s): FREET3 ------------------------------------------------------------------------------------------------------------------ No results for input(s): VITAMINB12, FOLATE, FERRITIN, TIBC, IRON, RETICCTPCT in the last 72 hours.  Coagulation profile  Recent Labs Lab 01/15/17 2249  INR 1.17    No results for input(s): DDIMER in the last 72 hours.  Cardiac Enzymes  Recent Labs Lab 01/15/17 1939  TROPONINI <0.03   ------------------------------------------------------------------------------------------------------------------ Invalid input(s): POCBNP    Assessment & Plan  Patient's 83 year old admitted with small bowel obstruction  1.   Small bowel obstruction - Continue supportive care may need NG tube at placed back in We'll give her a suppository today Surgery following  2. Peritoneal carcinomatosis [facility]) - appreciate oncology and gynecology consulted, CT scan does not show any clear primary cancerous process Pathology  results pending  3. hypertension use when necessary IV labetalol 4. Miscellaneous Lovenox for DVT prophylaxis    Code Status Orders        Start     Ordered   01/16/17 0155  Full code  Continuous     01/16/17 0154    Code Status History    Date Active Date Inactive Code Status Order ID Comments User Context   This patient has a current code status but no historical code status.    Advance Directive Documentation   Flowsheet Row Most Recent Value  Type of Advance Directive  Healthcare Power of Attorney, Living will  Pre-existing out of facility DNR order (yellow form or pink MOST form)  No data  "MOST" Form in Place?  No data           Consults  Gyn, surgery, oncology  DVT Prophylaxis  Lovenox   Lab Results  Component Value Date   PLT 234 01/19/2017     Time Spent in minutes  7min  Greater than 50% of time spent in care coordination and counseling patient regarding the condition and plan of care.   Dustin Flock M.D on 01/19/2017 at 11:29 AM  Between 7am to 6pm - Pager - [phone]  After 6pm go to [email or web address] - password [email or web address]   Office  [phone]

## 2017-01-19 NOTE — Progress Notes (Signed)
CC: Bowel obstruction Subjective: Patient was admitted with a small bowel obstruction and other findings concerning for possible carcinomatosis. She had 1 episode of nausea and vomiting yesterday evening. No emesis since that time. She is continued to tolerate her liquids and has had some results per rectum. She manually disimpacting herself this morning. Continues to have some nausea but has not had any vomiting.  Objective: Vital signs in last 24 hours: Temp:  [97.9 F (36.6 C)-98.4 F (36.9 C)] 98.2 F (36.8 C) (03/03 0446) Pulse Rate:  [78-85] 78 (03/03 0446) Resp:  [18-20] 18 (03/03 0446) BP: (138-154)/(63-97) 154/73 (03/03 0446) SpO2:  [97 %] 97 % (03/03 0446) Last BM Date: 01/15/17  Intake/Output from previous day: 03/02 0701 - 03/03 0700 In: 4541.7 [P.O.:1500; I.V.:3041.7] Out: 1250 [Urine:1250] Intake/Output this shift: Total I/O In: 240 [P.O.:240] Out: 300 [Urine:300]  Physical exam:  Gen.: No acute distress Chest: Clear to auscultation Heart: Regular rhythm Abdomen: Soft, nontender, mildly distended  Lab Results: CBC   Recent Labs  01/17/17 0524 01/19/17 0338  WBC 7.3 4.4  HGB 13.2 12.3  HCT 38.1 36.3  PLT 241 234   BMET  Recent Labs  01/17/17 0524 01/19/17 0338  NA 139 137  K 3.6 3.3*  CL 107 109  CO2 24 21*  GLUCOSE 81 102*  BUN 12 10  CREATININE 0.52 0.47  CALCIUM 7.9* 7.4*   PT/INR No results for input(s): LABPROT, INR in the last 72 hours. ABG No results for input(s): PHART, HCO3 in the last 72 hours.  Invalid input(s): PCO2, PO2  Studies/Results: Ct Biopsy  Result Date: 01/17/2017 CLINICAL DATA:  Partial small bowel obstruction and CT evidence of peritoneal carcinomatosis with anterior peritoneal omental caking. CT-guided biopsy is performed of tumor. EXAM: CT GUIDED CORE BIOPSY OF PERITONEAL MASS ANESTHESIA/SEDATION: 2.0 mg IV Versed; 50 mcg IV Fentanyl Total Moderate Sedation Time:  11 minutes. The patient's level of consciousness  and physiologic status were continuously monitored during the procedure by Radiology nursing. PROCEDURE: The procedure risks, benefits, and alternatives were explained to the patient. Questions regarding the procedure were encouraged and answered. The patient understands and consents to the procedure. A time-out was performed prior to initiating the procedure. The anterior abdominal wall was prepped with chlorhexidine in a sterile fashion, and a sterile drape was applied covering the operative field. A sterile gown and sterile gloves were used for the procedure. Local anesthesia was provided with 1% Lidocaine. CT was performed in a supine position. After choosing a site for biopsy, a 23 gauge trocar needle was advanced from an anterior oblique approach to the level of the left anterior peritoneal cavity. A total of 3 separate 18 gauge core biopsy samples were then obtained and placed in formalin. Additional post biopsy CT images were performed. COMPLICATIONS: None FINDINGS: Tumor in the anterior peritoneal cavity immediately deep to the abdominal wall of the left lower quadrant was targeted. Solid tissue samples were obtained. IMPRESSION: CT-guided core biopsy performed of peritoneal tumor within the anterior left lower peritoneal cavity. Electronically Signed   By: Aletta Edouard M.D.   On: 01/17/2017 16:21    Anti-infectives: Anti-infectives    None      Assessment/Plan:  83 year old female admitted for small bowel traction. Had an episode of nausea and vomiting yesterday but has tolerated liquids since that time. Encourage ambulation, incentive spirometer, oral intake. If nausea and vomiting persists she may require replacement of her NG tube. The surgical services will continue to follow with you.  Abimael Zeiter T. Adonis Huguenin, MD, [facility] General Surgeon [facility]  Day [facility] [phone] Night [facility] [phone] 01/19/2017

## 2017-01-19 NOTE — Progress Notes (Signed)
Notified Dr Dustin Flock of pt continuing to c/o nausea. Notified that zofran given without much relief, and 1 episode of emesis. Dr acknowledged, orders received

## 2017-01-20 ENCOUNTER — Inpatient Hospital Stay: Payer: Medicare Other

## 2017-01-20 LAB — BASIC METABOLIC PANEL
Anion gap: 8 (ref 5–15)
BUN: 6 mg/dL (ref 6–20)
CALCIUM: 7.6 mg/dL — AB (ref 8.9–10.3)
CO2: 23 mmol/L (ref 22–32)
CREATININE: 0.47 mg/dL (ref 0.44–1.00)
Chloride: 105 mmol/L (ref 101–111)
GLUCOSE: 101 mg/dL — AB (ref 65–99)
Potassium: 3.1 mmol/L — ABNORMAL LOW (ref 3.5–5.1)
Sodium: 136 mmol/L (ref 135–145)

## 2017-01-20 MED ORDER — HYDROMORPHONE HCL 1 MG/ML IJ SOLN
1.0000 mg | INTRAMUSCULAR | Status: DC | PRN
  Administered 2017-01-20 (×2): 1 mg via INTRAVENOUS
  Filled 2017-01-20 (×2): qty 1

## 2017-01-20 MED ORDER — POTASSIUM CHLORIDE IN NACL 40-0.9 MEQ/L-% IV SOLN
INTRAVENOUS | Status: AC
  Administered 2017-01-20: 75 mL/h via INTRAVENOUS
  Filled 2017-01-20: qty 1000

## 2017-01-20 NOTE — Progress Notes (Signed)
CC: SBO Subjective: Patient with multiple episodes of nausea and a few small episodes of vomiting overnight. This in spite of having some output per rectum.  Objective: Vital signs in last 24 hours: Temp:  [97.7 F (36.5 C)-98.2 F (36.8 C)] 97.7 F (36.5 C) (03/04 0451) Pulse Rate:  [75-102] 75 (03/04 0451) Resp:  [16-17] 16 (03/04 0451) BP: (157-169)/(84-92) 169/89 (03/04 0451) SpO2:  [98 %] 98 % (03/04 0451) Last BM Date: 01/15/17  Intake/Output from previous day: 03/03 0701 - 03/04 0700 In: 3446.7 [P.O.:480; I.V.:2966.7] Out: 1700 [Urine:1700] Intake/Output this shift: Total I/O In: -  Out: 500 [Urine:500]  Physical exam:  Gen.: No acute distress Abdomen: Soft, mildly distended, nontender. Present.  Lab Results: CBC   Recent Labs  01/19/17 0338  WBC 4.4  HGB 12.3  HCT 36.3  PLT 234   BMET  Recent Labs  01/19/17 0338 01/20/17 0358  NA 137 136  K 3.3* 3.1*  CL 109 105  CO2 21* 23  GLUCOSE 102* 101*  BUN 10 6  CREATININE 0.47 0.47  CALCIUM 7.4* 7.6*   PT/INR No results for input(s): LABPROT, INR in the last 72 hours. ABG No results for input(s): PHART, HCO3 in the last 72 hours.  Invalid input(s): PCO2, PO2  Studies/Results: No results found.  Anti-infectives: Anti-infectives    None      Assessment/Plan:  83 year old female with at least partial small bowel obstruction in the setting of postsurgical changes as well as possible carcinomatosis. Pathology from biopsy still pending. Discussed at length with the patient if she continues to have nausea and vomiting that an NG tube would be indicated to prevent her from having persistent vomiting. She appeared to voice understanding.  Devario Bucklew T. Adonis Huguenin, MD, [facility] General Surgeon [facility]  Day [facility] [phone] Night [facility] [phone] 01/20/2017

## 2017-01-20 NOTE — Progress Notes (Signed)
[place] [facility]                                                                                                                                                                                  Patient Demographics   Lisa Hayden, is a 83 y.o. female, DOB - 1930/03/25, [id number]  Admit date - 01/15/2017   Admitting Physician Lance Coon, MD  Outpatient Primary MD for the patient is Lelon Huh, MD   LOS - 4  Subjective:  States that she had an episode yesterday of throwing up everything she ate. Continues to abdominal pain now abdomen is distended more. Review of Systems:   CONSTITUTIONAL: No documented fever. No fatigue, weakness. No weight gain, no weight loss.  EYES: No blurry or double vision.  ENT: No tinnitus. No postnasal drip. No redness of the oropharynx.  RESPIRATORY: No cough, no wheeze, no hemoptysis. No dyspnea.  CARDIOVASCULAR: No chest pain. No orthopnea. No palpitations. No syncope.  GASTROINTESTINAL:Positive nausea, positive vomiting or diarrhea. Positive abdominal pain. No melena or hematochezia.  GENITOURINARY: No dysuria or hematuria.  ENDOCRINE: No polyuria or nocturia. No heat or cold intolerance.  HEMATOLOGY: No anemia. No bruising. No bleeding.  INTEGUMENTARY: No rashes. No lesions.  MUSCULOSKELETAL: No arthritis. No swelling. No gout.  NEUROLOGIC: No numbness, tingling, or ataxia. No seizure-type activity.  PSYCHIATRIC: No anxiety. No insomnia. No ADD.    Vitals:   Vitals:   01/19/17 0446 01/19/17 1249 01/19/17 2045 01/20/17 0451  BP: (!) 154/73 (!) 157/92 (!) 160/84 (!) 169/89  Pulse: 78 (!) 102 77 75  Resp: 18 16 17 16   Temp: 98.2 F (36.8 C) 97.7 F (36.5 C) 98.2 F (36.8 C) 97.7 F (36.5 C)  TempSrc: Oral Oral Oral Oral  SpO2: 97% 98% 98% 98%  Weight:      Height:        Wt Readings from Last 3 Encounters:  01/15/17 126 lb (57.2 kg)  11/07/16 135 lb (61.2 kg)  05/08/16 137 lb (62.1 kg)      Intake/Output Summary (Last 24 hours) at 01/20/17 1208 Last data filed at 01/20/17 0800  Gross per 24 hour  Intake          2106.67 ml  Output             1900 ml  Net           206.67 ml    Physical Exam:   GENERAL: Pleasant-appearing in no apparent distress.  HEAD, EYES, EARS, NOSE AND THROAT: Atraumatic, normocephalic. Extraocular muscles are intact. Pupils equal and reactive to light. Sclerae anicteric. No conjunctival injection. No oro-pharyngeal erythema.  NECK:  Supple. There is no jugular venous distention. No bruits, no lymphadenopathy, no thyromegaly.  HEART: Regular rate and rhythm,. No murmurs, no rubs, no clicks.  LUNGS: Clear to auscultation bilaterally. No rales or rhonchi. No wheezes.  ABDOMEN: Soft, ,Positive bowel sounds there is no guarding no rebound mild distention EXTREMITIES: No evidence of any cyanosis, clubbing, or peripheral edema.  +2 pedal and radial pulses bilaterally.  NEUROLOGIC: The patient is alert, awake, and oriented x3 with no focal motor or sensory deficits appreciated bilaterally.  SKIN: Moist and warm with no rashes appreciated.  Psych: Not anxious, depressed LN: No inguinal LN enlargement    Antibiotics   Anti-infectives    None      Medications   Scheduled Meds: . enoxaparin (LOVENOX) injection  40 mg Subcutaneous Daily   Continuous Infusions: . sodium chloride    . sodium chloride 100 mL/hr at 01/19/17 2142   PRN Meds:.acetaminophen **OR** acetaminophen, alum & mag hydroxide-simeth, HYDROmorphone (DILAUDID) injection, labetalol, ondansetron **OR** ondansetron (ZOFRAN) IV, promethazine   Data Review:   Micro Results No results found for this or any previous visit (from the past 240 hour(s)).  Radiology Reports Dg Chest 2 View  Result Date: 01/15/2017 CLINICAL DATA:  Umbilical abdominal pain. Vomiting x4 since December. Asthma. Former smoker. EXAM: CHEST  2 VIEW COMPARISON:  05/02/2015 CXR FINDINGS: The heart size and  mediastinal contours are within normal limits. Aortic atherosclerosis without aneurysm. The lungs are hyperinflated consistent with history of asthma. No pulmonary consolidation, effusion or pneumothorax. No overt pulmonary edema. Axillary clips are seen on the left. Old fracture deformity noted of the right seventh rib. Chronic anterior wedge fracture of T3. IMPRESSION: 1. Hyperinflated lungs.  No acute pulmonary disease. 2. Aortic atherosclerosis. 3. Chronic T3 vertebral body compression. Old right seventh rib fracture. Electronically Signed   By: Ashley Royalty M.D.   On: 01/15/2017 20:25   Dg Abd 1 View  Result Date: 01/15/2017 CLINICAL DATA:  Nasogastric tube placement.  Initial encounter. EXAM: ABDOMEN - 1 VIEW COMPARISON:  CT of the abdomen and pelvis performed earlier today at 8:56 p.m. FINDINGS: The patient's enteric tube is noted ending overlying the body of the stomach. The visualized bowel gas pattern is unremarkable. Residual contrast is seen within small bowel loops. Previously noted small bowel obstruction is difficult to assess on this image. No free intra-abdominal air is identified, though evaluation for free air is limited on a single supine view. The visualized osseous structures are within normal limits; the sacroiliac joints are unremarkable in appearance. The visualized lung bases are essentially clear. IMPRESSION: Enteric tube noted ending overlying the body of the stomach. Electronically Signed   By: Garald Balding M.D.   On: 01/15/2017 23:57   Ct Abdomen Pelvis W Contrast  Result Date: 01/15/2017 CLINICAL DATA:  Lower abdominal pain for 4 months. EXAM: CT ABDOMEN AND PELVIS WITH CONTRAST TECHNIQUE: Multidetector CT imaging of the abdomen and pelvis was performed using the standard protocol following bolus administration of intravenous contrast. CONTRAST:  135mL ISOVUE-300 IOPAMIDOL (ISOVUE-300) INJECTION 61% COMPARISON:  CT scan of October 25, 2009. FINDINGS: Lower chest: No acute  abnormality. Hepatobiliary: Cholelithiasis is noted. No focal abnormality is noted in the liver. Pancreas: Unremarkable. No pancreatic ductal dilatation or surrounding inflammatory changes. Spleen: Normal in size without focal abnormality. Adrenals/Urinary Tract: Adrenal glands are unremarkable. Kidneys are normal, without renal calculi, focal lesion, or hydronephrosis. Bladder is unremarkable. Stomach/Bowel: Distal small bowel obstruction is noted with transition zone seen in the pelvis,  best seen on image number 54 of series 2. Adjacent soft tissue density measuring 15 x 14 mm is noted concerning for peritoneal implant. Small sliding-type hiatal hernia is noted. Vascular/Lymphatic: Atherosclerosis of abdominal aorta is noted without aneurysm or dissection. 1 cm left periaortic lymph node is noted. Reproductive: Uterus is unremarkable. Ovaries are not well visualized. Other: Abnormal soft tissue density is seen involving the omentum in left lower quadrant consistent with peritoneal carcinomatosis. Small amount of free fluid is noted in dependent portion of pelvis. 19 x 16 mm soft tissue density is noted in left posterior pelvis concerning for peritoneal implant. Musculoskeletal: No acute or significant osseous findings. IMPRESSION: Cholelithiasis. Distal small bowel obstruction is noted with transition zone seen in the pelvis, most likely due to adjacent peritoneal implant or metastatic disease. Omental caking is seen in left lower quadrant, with another peritoneal implant seen in left posterior pelvis. This is concerning for peritoneal carcinomatosis. Aortic atherosclerosis. Small sliding-type hiatal hernia. Electronically Signed   By: Marijo Conception, M.D.   On: 01/15/2017 21:29   Ct Biopsy  Result Date: 01/17/2017 CLINICAL DATA:  Partial small bowel obstruction and CT evidence of peritoneal carcinomatosis with anterior peritoneal omental caking. CT-guided biopsy is performed of tumor. EXAM: CT GUIDED CORE  BIOPSY OF PERITONEAL MASS ANESTHESIA/SEDATION: 2.0 mg IV Versed; 50 mcg IV Fentanyl Total Moderate Sedation Time:  11 minutes. The patient's level of consciousness and physiologic status were continuously monitored during the procedure by Radiology nursing. PROCEDURE: The procedure risks, benefits, and alternatives were explained to the patient. Questions regarding the procedure were encouraged and answered. The patient understands and consents to the procedure. A time-out was performed prior to initiating the procedure. The anterior abdominal wall was prepped with chlorhexidine in a sterile fashion, and a sterile drape was applied covering the operative field. A sterile gown and sterile gloves were used for the procedure. Local anesthesia was provided with 1% Lidocaine. CT was performed in a supine position. After choosing a site for biopsy, a 43 gauge trocar needle was advanced from an anterior oblique approach to the level of the left anterior peritoneal cavity. A total of 3 separate 18 gauge core biopsy samples were then obtained and placed in formalin. Additional post biopsy CT images were performed. COMPLICATIONS: None FINDINGS: Tumor in the anterior peritoneal cavity immediately deep to the abdominal wall of the left lower quadrant was targeted. Solid tissue samples were obtained. IMPRESSION: CT-guided core biopsy performed of peritoneal tumor within the anterior left lower peritoneal cavity. Electronically Signed   By: Aletta Edouard M.D.   On: 01/17/2017 16:21   Dg Abd 2 Views  Result Date: 01/20/2017 CLINICAL DATA:  Nausea and vomiting for 2 days. EXAM: ABDOMEN - 2 VIEW COMPARISON:  CT scan January 15, 2017 and KUB January 15, 2017 FINDINGS: The NG tube has been removed in the interval. There is an air-fluid level in the stomach. Dilated small bowel loops are identified with a paucity of gas in the colon. Stool is seen throughout the length of the colon. No free air or portal venous gas. No  pneumatosis. No other acute abnormalities. IMPRESSION: 1. Worsening small bowel obstruction.  Interval removal of NG tube. These results will be called to the ordering clinician or representative by the Radiologist Assistant, and communication documented in the PACS or zVision Dashboard. Electronically Signed   By: Dorise Bullion III M.D   On: 01/20/2017 10:34     CBC  Recent Labs Lab 01/15/17 1939 01/16/17  [id number] 01/17/17 0524 01/19/17 0338  WBC 6.3 9.9 7.3 4.4  HGB 14.1 13.5 13.2 12.3  HCT 42.2 38.7 38.1 36.3  PLT 283 270 241 234  MCV 88.2 87.2 88.2 88.1  MCH 29.5 30.4 30.6 29.8  MCHC 33.5 34.8 34.7 33.8  RDW 13.6 13.4 13.4 13.3    Chemistries   Recent Labs Lab 01/15/17 1939 01/16/17 0416 01/17/17 0524 01/19/17 0338 01/20/17 0358  NA 134* 139 139 137 136  K 3.9 3.9 3.6 3.3* 3.1*  CL 102 105 107 109 105  CO2 26 25 24  21* 23  GLUCOSE 162* 132* 81 102* 101*  BUN 17 14 12 10 6   CREATININE 0.73 0.50 0.52 0.47 0.47  CALCIUM 9.2 8.5* 7.9* 7.4* 7.6*  AST 21  --   --   --   --   ALT 14  --   --   --   --   ALKPHOS 31*  --   --   --   --   BILITOT 0.9  --   --   --   --    ------------------------------------------------------------------------------------------------------------------ estimated creatinine clearance is 41.4 mL/min (by C-G formula based on SCr of 0.47 mg/dL). ------------------------------------------------------------------------------------------------------------------ No results for input(s): HGBA1C in the last 72 hours. ------------------------------------------------------------------------------------------------------------------ No results for input(s): CHOL, HDL, LDLCALC, TRIG, CHOLHDL, LDLDIRECT in the last 72 hours. ------------------------------------------------------------------------------------------------------------------ No results for input(s): TSH, T4TOTAL, T3FREE, THYROIDAB in the last 72 hours.  Invalid input(s):  FREET3 ------------------------------------------------------------------------------------------------------------------ No results for input(s): VITAMINB12, FOLATE, FERRITIN, TIBC, IRON, RETICCTPCT in the last 72 hours.  Coagulation profile  Recent Labs Lab 01/15/17 2249  INR 1.17    No results for input(s): DDIMER in the last 72 hours.  Cardiac Enzymes  Recent Labs Lab 01/15/17 1939  TROPONINI <0.03   ------------------------------------------------------------------------------------------------------------------ Invalid input(s): POCBNP    Assessment & Plan  Patient's 83 year old admitted with small bowel obstruction  1.   Small bowel obstruction - Continue supportive care may need NG tube at placed back in Continues to be more symptomatic abdomen is slightly more distended Surgery will be seeing the patient to decide if NG tube needs to be placed  2. Peritoneal carcinomatosis [facility]) - appreciate oncology and gynecology consulted, CT scan does not show any clear primary cancerous process Pathology results pending on the biopsy CA 129 levels are elevated suggestive of possible ovarian pathology   3. hypertension use when necessary IV labetalol 4. Miscellaneous Lovenox for DVT prophylaxis    Code Status Orders        Start     Ordered   01/16/17 0155  Full code  Continuous     01/16/17 0154    Code Status History    Date Active Date Inactive Code Status Order ID Comments User Context   This patient has a current code status but no historical code status.    Advance Directive Documentation   Flowsheet Row Most Recent Value  Type of Advance Directive  Healthcare Power of Attorney, Living will  Pre-existing out of facility DNR order (yellow form or pink MOST form)  No data  "MOST" Form in Place?  No data           Consults  Gyn, surgery, oncology  DVT Prophylaxis  Lovenox   Lab Results  Component Value Date   PLT 234 01/19/2017     Time  Spent in minutes  38min  Greater than 50% of time spent in care coordination and counseling patient regarding the condition and plan  of care.   Dustin Flock M.D on 01/20/2017 at 12:08 PM  Between 7am to 6pm - Pager - [phone]  After 6pm go to [email or web address] - password [email or web address] [facility]   Office  [phone]

## 2017-01-21 ENCOUNTER — Other Ambulatory Visit: Payer: Self-pay | Admitting: Pathology

## 2017-01-21 ENCOUNTER — Other Ambulatory Visit: Payer: Self-pay

## 2017-01-21 ENCOUNTER — Inpatient Hospital Stay: Payer: Medicare Other

## 2017-01-21 LAB — GLUCOSE, CAPILLARY: GLUCOSE-CAPILLARY: 97 mg/dL (ref 65–99)

## 2017-01-21 LAB — BASIC METABOLIC PANEL
ANION GAP: 8 (ref 5–15)
BUN: 11 mg/dL (ref 6–20)
CHLORIDE: 104 mmol/L (ref 101–111)
CO2: 24 mmol/L (ref 22–32)
Calcium: 8 mg/dL — ABNORMAL LOW (ref 8.9–10.3)
Creatinine, Ser: 0.5 mg/dL (ref 0.44–1.00)
GFR calc Af Amer: >60 mL/min (ref >60)
GFR calc non Af Amer: >60 mL/min (ref >60)
GLUCOSE: 110 mg/dL — AB (ref 65–99)
POTASSIUM: 4 mmol/L (ref 3.5–5.1)
Sodium: 136 mmol/L (ref 135–145)

## 2017-01-21 LAB — ALBUMIN: Albumin: 3.2 g/dL — ABNORMAL LOW (ref 3.5–5.0)

## 2017-01-21 LAB — SURGICAL PATHOLOGY

## 2017-01-21 LAB — PHOSPHORUS: PHOSPHORUS: 1.9 mg/dL — AB (ref 2.5–4.6)

## 2017-01-21 LAB — MAGNESIUM: MAGNESIUM: 2 mg/dL (ref 1.7–2.4)

## 2017-01-21 MED ORDER — BISACODYL 10 MG RE SUPP
10.0000 mg | Freq: Once | RECTAL | Status: AC
  Administered 2017-01-21: 10 mg via RECTAL
  Filled 2017-01-21: qty 1

## 2017-01-21 MED ORDER — INSULIN ASPART 100 UNIT/ML ~~LOC~~ SOLN: 0.0000 [IU] | Freq: Four times a day (QID) | SUBCUTANEOUS | Status: DC

## 2017-01-21 MED ORDER — M.V.I. ADULT IV INJ
INJECTION | INTRAVENOUS | Status: DC
  Filled 2017-01-21: qty 960

## 2017-01-21 MED ORDER — TRACE MINERALS CR-CU-MN-SE-ZN 10-1000-500-60 MCG/ML IV SOLN
INTRAVENOUS | Status: DC
  Filled 2017-01-21: qty 960

## 2017-01-21 NOTE — Progress Notes (Signed)
Dr. 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 If from colorectal source.  I discussed with them the alternative therapies including exploratory laparotomy to relieve her relative bowel obstruction by either performing a resection a bypass and/or a G-tube placement. Also discussed the potential for consultation with Gyn/onc to consider more definitive therapy including debulking. If I were to operate it would be for the sole purpose of rigid resolving her relative bowel obstruction but that the oncologic surgeons might be able to do something more aggressive and more therapeutic. This might even include intra-abdominal chemotherapy.  Dr. Posey Pronto and I then discussed with Dr. Reather Converse suggestions and Dr. Leafy Ro stated that she would call the [facility] oncologists to discuss alternative options as well and get back to [place] concerning recommendations. It was left that we could perform a paly of operation as early as tomorrow for her bowel obstruction or proceed with transfer or GYN onc consultation in the next day or 2.   Note that the patient has not vomited again today and has not required nasogastric tube placement therefore it is unlikely that the patient would require an emergent operation and that waiting for adequate and thorough consultation with GYN ONC is at least warranted.

## 2017-01-21 NOTE — Progress Notes (Signed)
Discussed options etc. with daughter. These been discussed previously with the patient and neighbor. Multiple questions were answered for them.  It is apparent that they are interested in waiting for GYN oncology consultation. During this conversation it became evident also that Dr. Leafy Ro had spoken to [facility] who will attempt transfer to [facility]. Family was understanding of this plan and in agreement.

## 2017-01-21 NOTE — Progress Notes (Signed)
PHARMACY - ADULT TOTAL PARENTERAL NUTRITION CONSULT NOTE   Pharmacy Consult for TPN  Indication: Small bowel obstruction   Patient Measurements: Height: 5\' 2"  (157.5 cm) Weight: 126 lb (57.2 kg) IBW/kg (Calculated) : 50.1 TPN AdjBW (KG): 57.2 Body mass index is 23.05 kg/m. Usual Weight:   Assessment: Pharmacy consulted to assist with electrolyte and glucose management in this 83 year old woman with small bowel obstruction  GI:  Endo: 0 Insulin requirements in the past 24 hours:  Lytes: Renal: Pulm: Cards:  Hepatobil: Neuro: ID:  Best Practices: TPN Access:3/5 TPN start date:3/5  Nutritional Goals:  Needs1715-2000 (30-35 kcal/kg Current Nutrition:   Plan:   Clinimix E 5/15 at 40 ml/hr 10 ml  MVI and 1 ml trace elements in TPN 0 units of regular insulin in TPN SSI  Ordered. Will recheck electrolytes with am labs.  Monitor TPN labs.  Lisa Hayden D 01/21/2017,3:27 PM

## 2017-01-21 NOTE — Progress Notes (Signed)
Abdominal films reviewed and shows improvement but patient had vomited earlier today.  No emesis since this morning however she has not passed any gas to speak of. She continues to have mild abdominal pain.  Discussed potential for nasogastric tube should she worsen and still awaiting biopsy results. This patient will likely require surgery at some point.

## 2017-01-21 NOTE — Progress Notes (Signed)
Nutrition Follow-up  DOCUMENTATION CODES:   Severe malnutrition in context of acute illness/injury  INTERVENTION:  -Discussed nutrition poc with MD Burt Knack; plan to proceed with PICC line placement with initiation of TPN.  -TPN: Recommend starting 5%AA/15%Dextrose at rate of 40 ml/hr today. Check TPN panel in AM, magnesium and phosphorus today with plan for supplementation as needed. Recommend addition of sliding scale insulin with initiation of TPN as well as daily weights. Further recommendations to follow   NUTRITION DIAGNOSIS:   Inadequate oral intake related to inability to eat as evidenced by NPO status.  Being addressed via TPN  GOAL:   Patient will meet greater than or equal to 90% of their needs  Progressing  MONITOR:   Diet advancement, Labs, Weight trends, I & O's  REASON FOR ASSESSMENT:   Malnutrition Screening Tool    ASSESSMENT:   83 year old female with PMHx of HTN, colon cancer s/p sigmoid colectomy in 1992, breast cancer s/p left lumpectomy and adjuvant radiation therapy in 1995,  presents with three-month history of intermittent abdominal discomfort and nausea found to have small bowel obstruction related to peritoneal carcinomatosis.   Pt with SBO possibly related to malignancy vs adhesions with plan for surgery NPO/CL diet day 6, did not tolerate CL tray, last episode of emesis this AM, not passing much gas, no BM Ongoing N/V, refused NG placement No central access at present, PICC line planned.  Spoke with MD Burt Knack this AM regarding recommendations for initiation of TPN Labs: reviewed Meds: reviewed  Diet Order:  Diet clear liquid Room service appropriate? Yes; Fluid consistency: Thin  Skin:  Reviewed, no issues  Last BM:  01/15/2017  Height:   Ht Readings from Last 1 Encounters:  01/15/17 5\' 2"  (1.575 m)    Weight:   Wt Readings from Last 1 Encounters:  01/15/17 126 lb (57.2 kg)    Ideal Body Weight:  50 kg  BMI:  Body mass index is  23.05 kg/m.  Estimated Nutritional Needs:   Kcal:  1715-2000 (30-35 kcal/kg)  Protein:  70-85 grams (1.2-1.5 grams/kg)  Fluid:  1.4 L/day (25 ml/kg)  EDUCATION NEEDS:   No education needs identified at this time  [place], [place], LDN [phone] Pager  [phone] Weekend/On-Call Pager

## 2017-01-21 NOTE — Progress Notes (Signed)
CC: Nausea and vomiting Subjective: This patient with nausea and vomiting and a workup suggesting bowel obstruction. This is unclear if it is related to adhesions or possible malignancy and a biopsy of omental mass is currently pending. It has not yet returned.  Patient describes ongoing nausea and vomiting multiple times she rated refused a nasogastric tube yesterday. She did pass a very minimal amount of gas however. She also describes abdominal pain diffusely. She has not had a bowel movement  Objective: Vital signs in last 24 hours: Temp:  [97.4 F (36.3 C)-98.3 F (36.8 C)] 97.5 F (36.4 C) (03/05 0726) Pulse Rate:  [77-131] 77 (03/05 0812) Resp:  [18-24] 20 (03/05 0726) BP: (152-204)/(79-124) 154/79 (03/05 0812) SpO2:  [96 %-98 %] 97 % (03/05 0726) Last BM Date: 01/15/17  Intake/Output from previous day: 03/04 0701 - 03/05 0700 In: 657 [P.O.:240; I.V.:417] Out: 2020 [Urine:2020] Intake/Output this shift: Total I/O In: -  Out: 350 [Emesis/NG output:350]  Physical exam:  Awake alert and oriented Vital signs are stable Abdomen is soft nontender minimally distended minimally tympanitic scars are noted as is ecchymosis. Calves are nontender  Lab Results: CBC   Recent Labs  01/19/17 0338  WBC 4.4  HGB 12.3  HCT 36.3  PLT 234   BMET  Recent Labs  01/20/17 0358 01/21/17 0404  NA 136 136  K 3.1* 4.0  CL 105 104  CO2 23 24  GLUCOSE 101* 110*  BUN 6 11  CREATININE 0.47 0.50  CALCIUM 7.6* 8.0*   PT/INR No results for input(s): LABPROT, INR in the last 72 hours. ABG No results for input(s): PHART, HCO3 in the last 72 hours.  Invalid input(s): PCO2, PO2  Studies/Results: Dg Abd 2 Views  Result Date: 01/20/2017 CLINICAL DATA:  Nausea and vomiting for 2 days. EXAM: ABDOMEN - 2 VIEW COMPARISON:  CT scan January 15, 2017 and KUB January 15, 2017 FINDINGS: The NG tube has been removed in the interval. There is an air-fluid level in the stomach. Dilated small  bowel loops are identified with a paucity of gas in the colon. Stool is seen throughout the length of the colon. No free air or portal venous gas. No pneumatosis. No other acute abnormalities. IMPRESSION: 1. Worsening small bowel obstruction.  Interval removal of NG tube. These results will be called to the ordering clinician or representative by the Radiologist Assistant, and communication documented in the PACS or zVision Dashboard. Electronically Signed   By: Dorise Bullion III M.D   On: 01/20/2017 10:34    Anti-infectives: Anti-infectives    None      Assessment/Plan:  Patient with a small bowel obstruction possibly related to malignancy versus adhesions she's had multiple surgeries in the past. She refused a nasogastric tube yesterday in spite of her x-ray with appearing quite poor with a dilated stomach. This morning she is amenable to nasogastric tube but no x-rays have been ordered. I will check x-rays and possibly and likely put a nasogastric tube back in place to aid in this patient's recovery. Depending on the results of the biopsy as well as her progression she will likely require surgery at some point. This was discussed with Dr. Leafy Ro and with prime doc.  Florene Glen, MD, FACS  01/21/2017

## 2017-01-21 NOTE — Discharge Summary (Signed)
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 Asthma   . Cancer ([place])   . Hypertension   . Osteoporosis     HOSPITAL COURSE:  83 year old admitted with small bowel obstruction  1. Small bowel obstruction - Continue supportive care may need NG tube at placed back in Continues to be more symptomatic abdomen is slightly more distended continues with intermittent nausea and vomiting. Patient was seen by general surgery and gynecology and decision was made given biopsy results significant with high-grade carcinomatosis patient would be served appropriately at [facility]. Dr. Levada Dy secord has accepted the patient and patient will be transferred to [facility] when bed is available. Patient can daughter Lattie Haw is aware and agreeable with the plan.  2. Peritoneal carcinomatosis [facility]) - appreciate oncology and gynecology consulted, CT scan does not show any clear primary cancerous process CA 129 levels are elevated suggestive of possible ovarian pathology -Dr. Grayland Ormond informed the plan  3. hypertension use when necessary IV labetalol  4. Miscellaneous Lovenox for DVT prophylaxis  Patient will be transferred to [facility]. Dr. Fredric Dine is the accepting physician.          CONSULTS OBTAINED:  Treatment Team:  Jules Husbands, MD Lloyd Huger, MD Benjaman Kindler, MD  DRUG ALLERGIES:   Allergies  Allergen Reactions  . Actonel [Risedronate Sodium] Other (See Comments)    headache  . Cortisone     very alert and did not sleep for 24 hrs  . Daypro  [Oxaprozin]   . Meloxicam     DISCHARGE MEDICATIONS:   Current Discharge Medication List    CONTINUE these medications which have NOT CHANGED   Details  alendronate (FOSAMAX) 70 MG tablet Take 1 tablet (70 mg total) by mouth once a week. On Sunday Qty: 12 tablet, Refills: 4   Associated Diagnoses: Osteoporosis, unspecified osteoporosis type, unspecified pathological fracture presence    aspirin 81 MG chewable tablet Chew 81 mg by mouth daily.    cholecalciferol (VITAMIN D) 1000 UNITS tablet Take 1,000 Units by mouth every evening. With dinner        If you experience worsening of your admission symptoms, develop shortness of breath, life threatening emergency, suicidal or homicidal thoughts you must seek medical attention immediately by calling 911 or calling your MD immediately  if symptoms less severe.  You Must read complete instructions/literature along with all the possible adverse reactions/side effects for all the Medicines you take and that have been prescribed to you. Take any new Medicines after you have completely understood and accept all the possible adverse reactions/side effects.   Please note  You were cared for by a hospitalist during your hospital stay. If you have any questions about your discharge medications or the care you received while you were in the hospital after you are discharged, you can call the unit and asked to speak with the hospitalist on call if  the hospitalist that took care of you is not available. Once you are discharged, your primary care physician will handle any further medical issues. Please note that NO REFILLS for any discharge medications will be authorized once you are discharged, as it is imperative that you return to  your primary care physician (or establish a relationship with a primary care physician if you do not have one) for your aftercare needs so that they can reassess your need for medications and monitor your lab values. Today   SUBJECTIVE   Intermittent abdominal pain and vomiting patient is a small BM today  VITAL SIGNS:  Blood pressure (!) 161/92, pulse 76, temperature 98.2 F (36.8 C), temperature source Oral, resp. rate 16, height 5\' 2"  (1.575 m), weight 57.2 kg (126 lb), SpO2 97 %.  I/O:   Intake/Output Summary (Last 24 hours) at 01/21/17 1601 Last data filed at 01/21/17 0736  Gross per 24 hour  Intake              240 ml  Output             1670 ml  Net            -1430 ml    PHYSICAL EXAMINATION:  GENERAL:  83 y.o.-year-old patient lying in the bed with no acute distress.  EYES: Pupils equal, round, reactive to light and accommodation. No scleral icterus. Extraocular muscles intact.  HEENT: Head atraumatic, normocephalic. Oropharynx and nasopharynx clear.  NECK:  Supple, no jugular venous distention. No thyroid enlargement, no tenderness.  LUNGS: Normal breath sounds bilaterally, no wheezing, rales,rhonchi or crepitation. No use of accessory muscles of respiration.  CARDIOVASCULAR: S1, S2 normal. No murmurs, rubs, or gallops.  ABDOMEN: Soft, non-tender, non-distended. Bowel sounds present. No organomegaly or mass.  EXTREMITIES: No pedal edema, cyanosis, or clubbing.  NEUROLOGIC: Cranial nerves II through XII are intact. Muscle strength 5/5 in all extremities. Sensation intact. Gait not checked.  PSYCHIATRIC: The patient is alert and oriented x 3.  SKIN: No obvious rash, lesion, or ulcer.   DATA REVIEW:   CBC   Recent Labs Lab 01/19/17 0338  WBC 4.4  HGB 12.3  HCT 36.3  PLT 234    Chemistries   Recent Labs Lab 01/15/17 1939  01/21/17 0404  NA 134*  < > 136  K 3.9  < > 4.0  CL 102  < > 104  CO2 26  < > 24  GLUCOSE 162*  < > 110*  BUN 17  < > 11  CREATININE  0.73  < > 0.50  CALCIUM 9.2  < > 8.0*  MG  --   --  2.0  AST 21  --   --   ALT 14  --   --   ALKPHOS 31*  --   --   BILITOT 0.9  --   --   < > = values in this interval not displayed.  Microbiology Results   No results found for this or any previous visit (from the past 240 hour(s)).  RADIOLOGY:  Dg Abd 2 Views  Result Date: 01/21/2017 CLINICAL DATA:  Small bowel obstruction. EXAM: ABDOMEN - 2 VIEW COMPARISON:  01/15/2017.  01/20/2017. FINDINGS: Upright film shows no evidence for intraperitoneal free air. Small bowel air-fluid level seen previously have decreased in the interval. Interval decrease in gaseous distention of the stomach. The gaseous distention of small bowel has also decreased in the interval and nearly resolved. IMPRESSION: Interval substantial decrease in the gastric  and small bowel distention seen on previous studies. Interval resolution of small bowel obstructive pattern on x-ray. Electronically Signed   By: Misty Stanley M.D.   On: 01/21/2017 10:15   Dg Abd 2 Views  Result Date: 01/20/2017 CLINICAL DATA:  Nausea and vomiting for 2 days. EXAM: ABDOMEN - 2 VIEW COMPARISON:  CT scan January 15, 2017 and KUB January 15, 2017 FINDINGS: The NG tube has been removed in the interval. There is an air-fluid level in the stomach. Dilated small bowel loops are identified with a paucity of gas in the colon. Stool is seen throughout the length of the colon. No free air or portal venous gas. No pneumatosis. No other acute abnormalities. IMPRESSION: 1. Worsening small bowel obstruction.  Interval removal of NG tube. These results will be called to the ordering clinician or representative by the Radiologist Assistant, and communication documented in the PACS or zVision Dashboard. Electronically Signed   By: Dorise Bullion III M.D   On: 01/20/2017 10:34     Management plans discussed with the patient, family and they are in agreement.  CODE STATUS:     Code Status Orders         Start     Ordered   01/16/17 0155  Full code  Continuous     01/16/17 0154    Code Status History    Date Active Date Inactive Code Status Order ID Comments User Context   This patient has a current code status but no historical code status.    Advance Directive Documentation   Flowsheet Row Most Recent Value  Type of Advance Directive  Healthcare Power of Attorney, Living will  Pre-existing out of facility DNR order (yellow form or pink MOST form)  No data  "MOST" Form in Place?  No data      TOTAL TIME TAKING CARE OF THIS PATIENT: 40 minutes.    Keli Buehner M.D on 01/21/2017 at 4:01 PM  Between 7am to 6pm - Pager - [phone] After 6pm go to [email or web address] - password [email or web address] [facility]  Office  [phone]  CC: Primary care physician; Lelon Huh, MD

## 2017-01-21 NOTE — Care Management Important Message (Signed)
Important Message  Patient Details  Name: AANIKA TAUSCHER MRN: [id number] Date of Birth: June 05, 1930   Medicare Important Message Given:  Yes    Beverly Sessions, RN 01/21/2017, 2:35 PM

## 2017-01-21 NOTE — Progress Notes (Signed)
Report called to Altha Harm, Therapist, sports at [facility] [facility] for transport to [facility]. Patient aware and Lattie Haw, daughter called. Transport to be called.

## 2017-01-22 NOTE — Progress Notes (Signed)
Pt in stable condition. [facility] transport arrived and pt left in stable condition. V/s WNL. All paperwork left with pt.

## 2017-01-23 ENCOUNTER — Inpatient Hospital Stay: Payer: Medicare Other

## 2017-01-23 ENCOUNTER — Inpatient Hospital Stay: Payer: Medicare Other | Admitting: Oncology

## 2017-01-23 NOTE — Progress Notes (Signed)
Patient had gone to restroom with aide, BP high x2.  RN retook, down to 164/78, 90, 22.  Second  Recheck @ 06:15  154/82, 86, 18 - patient has drowsy.

## 2017-01-24 ENCOUNTER — Ambulatory Visit: Payer: Medicare Other | Admitting: Oncology

## 2017-01-30 ENCOUNTER — Ambulatory Visit: Payer: Medicare Other | Admitting: Oncology

## 2017-01-31 DIAGNOSIS — E876 Hypokalemia: Secondary | ICD-10-CM

## 2017-01-31 DIAGNOSIS — I10 Essential (primary) hypertension: Secondary | ICD-10-CM

## 2017-01-31 DIAGNOSIS — K5669 Other partial intestinal obstruction: Secondary | ICD-10-CM

## 2017-01-31 DIAGNOSIS — C481 Malignant neoplasm of specified parts of peritoneum: Secondary | ICD-10-CM | POA: Diagnosis not present

## 2017-02-01 NOTE — Progress Notes (Signed)
  Oncology Nurse Navigator Documentation Ms. Shoults is seen for staple removal from 01/23/17 surgery. Midline incision is intact and well approximated. No erythema or warmth noted. Only scant reddish brown drainage present on underwear. Open to air. Scattered ecchymosis noted on abdomen. Lap port sites with dermabond still in place. No erythema or drainage noted from these sites. All staples removed. Denies allergies.Benzoin and 1/2 steri strips applied. Educated on steri strip purpose and to leave them in place until they come off on their own. This can take up to 2 weeks. Provided her with printed upcoming appointments for Dr. Theora Gianotti and changed appointment with Dr. Grayland Ormond from [facility] to [place]. This new appointment was also printed and given to her. All questions answered. Navigator Location: [facility] (02/01/17 1500)   )Navigator Encounter Type: Other (02/01/17 1500)       Surgery Date: 01/23/17 (02/01/17 1500)             Patient Visit Type: Post-op Appt (02/01/17 1500)   Barriers/Navigation Needs: Education (02/01/17 1500)   Interventions: Education (02/01/17 1500)     Education Method: Written;Teach-back;Verbal (02/01/17 1500)                Time Spent with Patient: 30 (02/01/17 1500)

## 2017-02-06 ENCOUNTER — Inpatient Hospital Stay: Payer: Medicare Other

## 2017-02-06 ENCOUNTER — Other Ambulatory Visit: Payer: Self-pay

## 2017-02-06 ENCOUNTER — Encounter: Payer: Self-pay | Admitting: Obstetrics and Gynecology

## 2017-02-06 ENCOUNTER — Inpatient Hospital Stay: Payer: Medicare Other | Attending: Obstetrics and Gynecology | Admitting: Obstetrics and Gynecology

## 2017-02-06 VITALS — BP 121/82 | HR 105 | Temp 98.3°F | Resp 18 | Ht 62.0 in | Wt 116.6 lb

## 2017-02-06 DIAGNOSIS — R5383 Other fatigue: Secondary | ICD-10-CM

## 2017-02-06 DIAGNOSIS — L900 Lichen sclerosus et atrophicus: Secondary | ICD-10-CM

## 2017-02-06 DIAGNOSIS — I10 Essential (primary) hypertension: Secondary | ICD-10-CM | POA: Diagnosis not present

## 2017-02-06 DIAGNOSIS — Z87891 Personal history of nicotine dependence: Secondary | ICD-10-CM | POA: Insufficient documentation

## 2017-02-06 DIAGNOSIS — Z79899 Other long term (current) drug therapy: Secondary | ICD-10-CM | POA: Diagnosis not present

## 2017-02-06 DIAGNOSIS — Z79891 Long term (current) use of opiate analgesic: Secondary | ICD-10-CM | POA: Diagnosis not present

## 2017-02-06 DIAGNOSIS — Z853 Personal history of malignant neoplasm of breast: Secondary | ICD-10-CM

## 2017-02-06 DIAGNOSIS — R197 Diarrhea, unspecified: Secondary | ICD-10-CM

## 2017-02-06 DIAGNOSIS — R531 Weakness: Secondary | ICD-10-CM | POA: Insufficient documentation

## 2017-02-06 DIAGNOSIS — E43 Unspecified severe protein-calorie malnutrition: Secondary | ICD-10-CM | POA: Insufficient documentation

## 2017-02-06 DIAGNOSIS — C801 Malignant (primary) neoplasm, unspecified: Secondary | ICD-10-CM

## 2017-02-06 DIAGNOSIS — C786 Secondary malignant neoplasm of retroperitoneum and peritoneum: Secondary | ICD-10-CM

## 2017-02-06 DIAGNOSIS — R5381 Other malaise: Secondary | ICD-10-CM | POA: Diagnosis not present

## 2017-02-06 DIAGNOSIS — M810 Age-related osteoporosis without current pathological fracture: Secondary | ICD-10-CM | POA: Diagnosis not present

## 2017-02-06 DIAGNOSIS — Z90722 Acquired absence of ovaries, bilateral: Secondary | ICD-10-CM | POA: Diagnosis not present

## 2017-02-06 DIAGNOSIS — Z90710 Acquired absence of both cervix and uterus: Secondary | ICD-10-CM

## 2017-02-06 DIAGNOSIS — Z7982 Long term (current) use of aspirin: Secondary | ICD-10-CM | POA: Insufficient documentation

## 2017-02-06 LAB — CBC WITH DIFFERENTIAL/PLATELET
Basophils Absolute: 0 10*3/uL (ref 0–0.1)
Basophils Relative: 0 %
Eosinophils Absolute: 0.3 10*3/uL (ref 0–0.7)
Eosinophils Relative: 4 %
HEMATOCRIT: 37 % (ref 35.0–47.0)
HEMOGLOBIN: 12.4 g/dL (ref 12.0–16.0)
LYMPHS ABS: 1 10*3/uL (ref 1.0–3.6)
LYMPHS PCT: 13 %
MCH: 29.6 pg (ref 26.0–34.0)
MCHC: 33.5 g/dL (ref 32.0–36.0)
MCV: 88.6 fL (ref 80.0–100.0)
Monocytes Absolute: 0.7 10*3/uL (ref 0.2–0.9)
Monocytes Relative: 9 %
NEUTROS ABS: 5.9 10*3/uL (ref 1.4–6.5)
NEUTROS PCT: 74 %
Platelets: 520 10*3/uL — ABNORMAL HIGH (ref 150–440)
RBC: 4.18 MIL/uL (ref 3.80–5.20)
RDW: 14.5 % (ref 11.5–14.5)
WBC: 8 10*3/uL (ref 3.6–11.0)

## 2017-02-06 LAB — MAGNESIUM: MAGNESIUM: 2 mg/dL (ref 1.7–2.4)

## 2017-02-06 LAB — COMPREHENSIVE METABOLIC PANEL
ALK PHOS: 61 U/L (ref 38–126)
ALT: 59 U/L — AB (ref 14–54)
AST: 39 U/L (ref 15–41)
Albumin: 4.2 g/dL (ref 3.5–5.0)
Anion gap: 10 (ref 5–15)
BILIRUBIN TOTAL: 0.9 mg/dL (ref 0.3–1.2)
BUN: 23 mg/dL — ABNORMAL HIGH (ref 6–20)
CALCIUM: 9.9 mg/dL (ref 8.9–10.3)
CO2: 23 mmol/L (ref 22–32)
CREATININE: 0.71 mg/dL (ref 0.44–1.00)
Chloride: 99 mmol/L — ABNORMAL LOW (ref 101–111)
Glucose, Bld: 106 mg/dL — ABNORMAL HIGH (ref 65–99)
Potassium: 4.2 mmol/L (ref 3.5–5.1)
SODIUM: 132 mmol/L — AB (ref 135–145)
Total Protein: 7.8 g/dL (ref 6.5–8.1)

## 2017-02-06 LAB — VITAMIN B12: Vitamin B-12: 496 pg/mL (ref 180–914)

## 2017-02-06 MED ORDER — CHOLESTYRAMINE 4 G PO PACK: 4.0000 g | PACK | Freq: Three times a day (TID) | ORAL | 12 refills | Status: DC

## 2017-02-06 NOTE — Progress Notes (Signed)
Gynecologic Oncology Interval Visit   Referring Provider: Benjaman Kindler, MD  Chief Concern: postoperative visit and counseling for advanced tubal cancer.   Subjective:  Lisa Hayden is a 83 y.o. female who is seen in consultation from Dr. 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   CT scan abdomen and pelvis on 01/15/2017 IMPRESSION: Cholelithiasis and distal small bowel obstruction is noted with transition zone seen in the pelvis, most likely due to adjacent peritoneal implant or metastatic disease. Omental caking is seen in left lower quadrant, with another peritoneal implant seen in left posterior pelvis. This is concerning for peritoneal carcinomatosis.  CXR; negative for metastasis  She was transferred to [facility] and on 01/23/3017 underwent a diagnostic laparoscopy conversion to laparotomy with TAH BSO, omentectomy, SBRxRA and optimal tumor debulking.   Pathology  A. 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  B. Small bowel, partial resection: Metastatic high grade serous carcinoma. Two lymph nodes, all negative for metastatic carcinoma (0/2).  C. Colon mesentery nodule, resection: Metastatic high grade serous carcinoma.  D. 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 Ovary with no pathologic diagnosis.  E. Right ovary and right fallopian tube, right salpingo-oophorectomy: Ovary and fallopian tube with no pathologic diagnosis. Negative for malignancy.  F. Uterus, hysterectomy: Uterus (50 grams): Endometrium:Inactive Myometrium: No pathologic diagnosis. Cervix: Negative for malignancy. Serosa: Negative for malignancy.   OVARY or FALLOPIAN TUBE: Oophorectomy, Salpingectomy, Salpingo-Oophorectomy, Subtotal Oophorectomy or Removal of Tumor in  Fragments, Hysterectomy With Salpingo-Oophorectomy or Salpingectomy(Ovary FT - All Specimens)    Lab Results  Component Value Date   CA125 74.8 (H) 01/15/2017   She presents to discuss subsequent management. She has many complaints as noted in ROS. She is using her anti-coagulation.   Problem List: Patient Active Problem List   Diagnosis Date Noted  . Protein-calorie malnutrition, severe 01/18/2017  . Peritoneal carcinomatosis ([place]) 01/16/2017  . SBO (small bowel obstruction) 01/16/2017  . Small bowel obstruction   . Malignant neoplasm of colon ([place]) 05/08/2016  . Reactive airway disease 05/04/2015  . Bronchitis 05/04/2015  . Asthma without status asthmaticus 05/03/2015  . Back ache 05/03/2015  . Body mass index (BMI) of 24.0-24.9 in adult 05/03/2015  . Breast CA ([place]) 05/03/2015  . Blood pressure elevated 05/03/2015  . Foot pain 05/03/2015  . BP (high blood pressure) 05/03/2015  . Leg pain 05/03/2015  . Lichen sclerosus of female genitalia 05/03/2015  . Neutropenia ([place]) 05/03/2015  . Tendinitis of wrist 05/03/2015  . Atrophy of vagina 05/03/2015  . Avitaminosis D 05/03/2015  . CN (constipation) 01/11/2010  . Mechanical and motor problems with internal organs 09/21/2009  . Arthralgia of shoulder 08/31/2009  . Personal history of malignant neoplasm of breast 04/13/2009  . OP (osteoporosis) 04/13/2009    Past Medical History: Past Medical History:  Diagnosis Date  . Asthma   . Cancer ([place])   . Hypertension   . Osteoporosis     Past Surgical History: Past Surgical History:  Procedure Laterality Date  . 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 APPENDECTOMY  1953  . BREAST LUMPECTOMY Left 10/29/94   negative nodes. Treated with radiation only  . BREAST SURGERY    . BUNIONECTOMY  1979  . CATARACT EXTRACTION, BILATERAL     left 07/29/2012; right 06/24/2012  . COLON  SURGERY  05/1991   secondary to cancer   . ROTATOR  CUFF REPAIR Right 01/25/10  . TONSILLECTOMY  1938   and adenoidectomy    Past Gynecologic History:  As per interval history  OB History:  OB History  Gravida Para Term Preterm AB Living  1 1          SAB TAB Ectopic Multiple Live Births               # Outcome Date GA Lbr Len/2nd Weight Sex Delivery Anes PTL Lv  1 Para               Family History: Family History  Problem Relation Age of Onset  . CVA Mother   . Congestive Heart Failure Mother   . Diabetes Sister   . Asthma Father     Social History: Social History   Social History  . Marital status: Married    Spouse name: [name]  . Number of children: 1  . Years of education: College   Occupational History  . Retired    Social History Main Topics  . Smoking status: Former Research scientist (life sciences)  . Smokeless tobacco: Never Used  . Alcohol use 4.2 oz/week    7 Standard drinks or equivalent per week     Comment: glass or beer at dinner  . Drug use: No  . Sexual activity: Not on file   Other Topics Concern  . Not on file   Social History Narrative  . No narrative on file    Allergies: Allergies  Allergen Reactions  . Actonel [Risedronate Sodium] Other (See Comments)    headache  . Cortisone     very alert and did not sleep for 24 hrs  . Daypro  [Oxaprozin]   . Meloxicam     Current Medications: Current Outpatient Prescriptions  Medication Sig Dispense Refill  . alendronate (FOSAMAX) 70 MG tablet Take 1 tablet (70 mg total) by mouth once a week. On Sunday 12 tablet 4  . aspirin 81 MG chewable tablet Chew 81 mg by mouth daily.    . cholecalciferol (VITAMIN D) 1000 UNITS tablet Take 2,000 Units by mouth every evening. With dinner     . cholestyramine (QUESTRAN) 4 g packet Take 1 packet (4 g total) by mouth 3 (three) times daily with meals. 60 each 12   No current facility-administered medications for this visit.     Review of Systems General: fatigue and weakness Skin: wound healing HEENT: negative for, change  in hearing, pain Pulmonary: negative for, dyspnea, productive cough Cardiac: negative for, pain Gastrointestinal: positive for diarrhea and decreased appetite, negative for nausea, vomiting, jaundice, pain, constipation. She has about 3 episodes of diarrhea per day. Her symptoms have improved over time. She is able to tolerate oral intake, but prefers soups over heavier food.  Genitourinary/Sexual: negative for, dysuria Ob/Gyn: negative for, irregular bleeding; she has a small amount of discharge.  Musculoskeletal: negative Hematology: easy bruising Neurologic/Psych: negative for, depression  Objective:  Physical Examination:  BP 121/82   Pulse (!) 105   Temp 98.3 F (36.8 C) (Tympanic)   Resp 18   Ht 5\' 2"  (1.575 m)   Wt 116 lb 9.6 oz (52.9 kg)   BMI 21.33 kg/m    ECOG Performance Status: 1 - Symptomatic but completely ambulatory  General appearance: alert, cooperative and appears stated age HEENT:PERRLA, extra ocular movement intact and sclera clear, anicteric Lymph node survey: non-palpable, axillary, inguinal, supraclavicular Cardiovascular: regular rate and  rhythm Respiratory: normal air entry, lungs clear to auscultation Abdomen: soft, non-tender, without masses or organomegaly, no hernias and well healed incision with steri strips in place Back: inspection of back is normal Extremities: extremities normal, atraumatic, no cyanosis or edema Skin exam - extensive abdominal bruising Neurological exam reveals alert, oriented, normal speech, no focal findings or movement disorder noted.  Pelvic: Deferred   Lab Review pending  Radiologic Imaging: n/a    Assessment:  Lisa Hayden is a 83 y.o. female diagnosed with advanced fallopian tube cancer s/p exploratory laparotomy with TAH BSO, omentectomy, SBRxRA and optimal tumor debulking.  She is doing well postoperatively, however, several symptoms warrant further evaluation. Weakness, fatigue worrisome for anemia, could  also be due to electrolyte abnormalities given diarrhea. Diarrhea due to small bowel resection; DDx includes C.Diff infection. Bruising due to anticoagulation.   Medical co-morbidities complicating care: Protein-calorie malnutrition, severe; h/o Malignant neoplasm of colon ([place]) and breast cancer; Asthma; osteoporosis, and frailty.  Plan:   Problem List Items Addressed This Visit      Other   Peritoneal carcinomatosis ([place])   Relevant Orders   CBC with Differential/Platelet (Completed)   Comprehensive metabolic panel (Completed)   Clostridium Difficile by PCR   Vitamin B12   Magnesium (Completed)   Amb Referral to Nutrition and Diabetic Education    Other Visit Diagnoses    Diarrhea, unspecified type    -  Primary   Relevant Orders   CBC with Differential/Platelet (Completed)   Comprehensive metabolic panel (Completed)   Clostridium Difficile by PCR   Vitamin B12   Magnesium (Completed)   Amb Referral to Nutrition and Diabetic Education   Fatigue, unspecified type       Relevant Orders   CBC with Differential/Platelet (Completed)   Comprehensive metabolic panel (Completed)   Clostridium Difficile by PCR   Vitamin B12   Magnesium (Completed)   Amb Referral to Nutrition and Diabetic Education      We discussed management and I recommended chemotherapy with either lower dose paclitaxel and carboplatin or carboplatin alone. We will refer to Dr. Grayland Ormond for chemotherapy management and discussion of genetic testing.  We will order CBC, CMP, magnesium, vitamin B12.   For diarrhea I ordered a C.diff, recommended Questran and discussed dietary modifications. I have requested followup with a nutritionist to discuss dietary changes that may be needed with terminal ileum resections and short bowel syndrome. I do think her symptoms will continue to improve and if C.diff negative we can add lomotil to her regimen. If symptoms are refractory we can add a PPI. I emphasized adequate  hydration and trying to achieve at least a liter of oral fluids daily.   The patient's diagnosis, an outline of the further diagnostic and laboratory studies which will be required, the recommendation, and alternatives were discussed.  All questions were answered to the patient's satisfaction.  A total of 60 minutes were spent with the patient/family today; 75% was spent in education, counseling and coordination of care for tubal cancer.    Gillis Ends, MD    CC:  Benjaman Kindler, MD

## 2017-02-06 NOTE — Patient Instructions (Signed)
Short Bowel Syndrome Short bowel syndrome is a condition that means you have lost the ability to absorb enough fluids and nutrients from your diet. It occurs in people who have had part of their small intestine surgically removed, or when part of the small intestine is missing for other reasons, such as a birth defect (congenital malformation). When you eat and drink, food and fluids go from your stomach to your small intestine. This is where most of the nutrients in your food and fluids are absorbed. If you have lost more than half of your small intestine, you may not be able to get enough nutrients, vitamins, or fluids to stay healthy. What are the causes? Having part of your small intestine removed during surgery is the most common cause of short bowel syndrome. You may have this type of surgery to treat:  Crohn disease.  Cancer.  Obesity.  Obstructions.  Loss of blood supply. Other causes of small bowel loss include:  Damage from X-ray treatments.  Scar tissue.  Trauma.  Being born with a short small intestine.  Damage to and loss of the lining of the small intestine wall in infants (necrotizing enterocolitis). What are the signs or symptoms? Long-term (chronic) diarrhea is the most common sign of small bowel syndrome. Other signs and symptoms of the condition include:  Cramping and bloating.  Heartburn.  Fatty, bad-smelling bowel movements.  Weight loss.  Fatigue.  Swelling of the feet and ankles.  Easy bruising.  Yellow skin (jaundice).  Rashes.  Muscle loss or weakness. How is this diagnosed? Your health care provider may diagnose short bowel syndrome based on your symptoms and medical history. Various tests may be done to find out how much short bowel syndrome is affecting your health and nutrition. These may include:  Blood tests to check for:  Low red blood cell count (anemia).  Low levels of vitamins and minerals.  Low levels of an important protein  called albumin.  Liver problems.  Checking stool samples for fat content.  X-rays or other imaging studies of the small intestine. How is this treated? Treatment for short bowel syndrome differs from person to person. Your treatment will depend on the cause and how severe the effects are. If your short bowel syndrome developed from surgery, your symptoms may improve over a period of about 2 years as the rest of your small intestine adapts. The goals of treatment are to relieve symptoms and improve nutrition. You may need to work with a dietitian to change your diet. Your new diet may include:  More protein and healthy carbohydrates.  Less fat and sugar. Other treatments may include:  Supplements. These may be necessary for:  Getting enough vitamins and minerals.  Rehydration.  Proper nutrition.  Medicines. You may need to take medicine to:  Control diarrhea.  Reduce stomach acid.  Slow digestion.  Improve growth and muscle strength.  Parenteral nutrition. You may need to be fed through a tube inserted into a large vein in your chest, neck, or arm.  Surgery.  Several types of surgery can help improve intestinal absorption.  In some cases, the small bowel may be removed and replaced with one from a donor (intestinal transplant). Follow these instructions at home: Living with short bowel syndrome can be challenging. Learn as much as you can about your condition, and work closely with all your health care providers. This may include a dietitian.  Take medicines only as directed by your health care provider.  Follow your  diet plan.  Eat small meals six to eight times a day instead of three large meals.  Keep a food diary to see which foods cause diarrhea or other symptoms.  Drink plenty of fluids between meals, but limit how much you drink during meals.  When you go out:  Always locate the nearest bathroom.  Have a change of clothes on hand.  Stay active. Ask  your health care provider what a safe level of activity is for you.  Make sure you have a good support network at home.  Keep all follow-up visits as directed by your health care provider. This is important. Contact a health care provider if:  Your symptoms get worse, or you develop new symptoms.  You lose weight.  You have trouble coping at home. Get help right away if:  You have very bad pain.  Your diarrhea will not stop.  You have chest pain or trouble breathing. This information is not intended to replace advice given to you by your health care provider. Make sure you discuss any questions you have with your health care provider. Document Released: 10/18/2004 Document Revised: 07/01/2016 Document Reviewed: 01/08/2014 Elsevier Interactive Patient Education  2017 [place].   Cholestyramine powder for oral suspension What is this medicine? CHOLESTYRAMINE (koe LESS tir a meen) is used to lower cholesterol in patients who are at risk of heart disease or stroke. This medicine is only for patients whose cholesterol level is not controlled by diet. This medicine may be used for other purposes; ask your health care provider or pharmacist if you have questions. COMMON BRAND NAME(S): Locholest, Locholest Light, Prevalite, Questran, Questran Light What should I tell my health care provider before I take this medicine? They need to know if you have any of these conditions: -blocked bile duct -an unusual or allergic reaction to cholestyramine, other medicines, foods, dyes, or preservatives -pregnant or trying to get pregnant -breast-feeding How should I use this medicine? Do not take this medicine in the dry form. It must be mixed with a liquid before swallowing. Follow the directions on the prescription label. Place the powder in a glass or cup. Add between 2 and 6 ounces of fluid. This can be water, milk, pulpy fruit juice, fluid soup, or other liquid. Mix well and drink all of the  liquid. Take your doses at regular intervals. Do not take your medicine more often than directed. Talk to your pediatrician regarding the use of this medicine in children. Special care may be needed. Overdosage: If you think you have taken too much of this medicine contact a poison control center or emergency room at once. NOTE: This medicine is only for you. Do not share this medicine with others. What if I miss a dose? If you miss a dose, take it as soon as you can. If it is almost time for your next dose, take only that dose. Do not take double or extra doses. What may interact with this medicine? -diuretics -female hormones, like estrogens or progestins and birth control pills -heart medicines such as digoxin or digitoxin -penicillin G -phenobarbital -phenylbutazone -phytonadione -propranolol -tetracycline antibiotics -thyroid hormones -vitamin A -vitamin D -vitamin E -warfarin Take other drugs at least 1 hour before or 4 hours after this medicine, to avoid decreasing their absorption. This list may not describe all possible interactions. Give your health care provider a list of all the medicines, herbs, non-prescription drugs, or dietary supplements you use. Also tell them if you smoke, drink alcohol,  or use illegal drugs. Some items may interact with your medicine. What should I watch for while using this medicine? Visit your doctor or health care professional for regular checks on your progress. Your blood fats and other tests will be measured from time to time. This medicine is only part of a total cholesterol-lowering program. Your health care professional or dietician can suggest a low-cholesterol and low-fat diet that will reduce your risk of getting heart and blood vessel disease. Avoid alcohol and smoking, and keep a proper exercise schedule. To reduce the chance of getting constipated, drink plenty of water and increase the amount of fiber in your diet. Ask your doctor or  health care professional for advice if you are constipated. What side effects may I notice from receiving this medicine? Side effects that you should report to your doctor or health care professional as soon as possible: -allergic reactions like skin rash, itching or hives, swelling of the face, lips, or tongue -bloody or black, tarry stools -severe stomach pain with nausea and vomiting -unusual bleeding Side effects that usually do not require medical attention (report to your doctor or health care professional if they continue or are bothersome): -constipation or diarrhea -dizziness -headache -heartburn, indigestion -nausea, vomiting -perianal irritation This list may not describe all possible side effects. Call your doctor for medical advice about side effects. You may report side effects to FDA at 1-800-FDA-1088. Where should I keep my medicine? Keep out of the reach of children. Store at room temperature between 15 and 30 degrees C (59 and 86 degrees F). Throw away any unused medicine after the expiration date. NOTE: This sheet is a summary. It may not cover all possible information. If you have questions about this medicine, talk to your doctor, pharmacist, or health care provider.  2018 Elsevier/Gold Standard (2008-02-10 15:33:42)    Criss Rosales Diet - Recommended for Diarrhea A bland diet consists of foods that do not have a lot of fat or fiber. Foods without fat or fiber are easier for the body to digest. They are also less likely to irritate your mouth, throat, stomach, and other parts of your gastrointestinal tract. A bland diet is sometimes called a BRAT diet. What is my plan? Your health care provider or dietitian may recommend specific changes to your diet to prevent and treat your symptoms, such as:  Eating small meals often.  Cooking food until it is soft enough to chew easily.  Chewing your food well.  Drinking fluids slowly.  Not eating foods that are very spicy,  sour, or fatty.  Not eating citrus fruits, such as oranges and grapefruit. What do I need to know about this diet?  Eat a variety of foods from the bland diet food list.  Do not follow a bland diet longer than you have to.  Ask your health care provider whether you should take vitamins. What foods can I eat? Grains   Hot cereals, such as cream of wheat. Bread, crackers, or tortillas made from refined white flour. Rice. Vegetables  Canned or cooked vegetables. Mashed or boiled potatoes. Fruits  Bananas. Applesauce. Other types of cooked or canned fruit with the skin and seeds removed, such as canned peaches or pears. Meats and Other Protein Sources  Scrambled eggs. Creamy peanut butter or other nut butters. Lean, well-cooked meats, such as chicken or fish. Tofu. Soups or broths. Dairy  Low-fat dairy products, such as milk, cottage cheese, or yogurt. Beverages  Water. Herbal tea. Apple juice. Sweets and  Desserts  Pudding. Custard. Fruit gelatin. Ice cream. Fats and Oils  Mild salad dressings. [place]ola or olive oil. The items listed above may not be a complete list of allowed foods or beverages. Contact your dietitian for more options.  What foods are not recommended? Foods and ingredients that are often not recommended include:  Spicy foods, such as hot sauce or salsa.  Fried foods.  Sour foods, such as pickled or fermented foods.  Raw vegetables or fruits, especially citrus or berries.  Caffeinated drinks.  Alcohol.  Strongly flavored seasonings or condiments. The items listed above may not be a complete list of foods and beverages that are not allowed. Contact your dietitian for more information.  This information is not intended to replace advice given to you by your health care provider. Make sure you discuss any questions you have with your health care provider. Document Released: 02/27/2016 Document Revised: 04/12/2016 Document Reviewed: 11/17/2014 Elsevier  Interactive Patient Education  2017 [employer].

## 2017-02-06 NOTE — Progress Notes (Signed)
Pt states she feels achy and sometimes like that she when stands up too.

## 2017-02-07 ENCOUNTER — Other Ambulatory Visit: Payer: Self-pay | Admitting: *Deleted

## 2017-02-07 DIAGNOSIS — R197 Diarrhea, unspecified: Secondary | ICD-10-CM

## 2017-02-07 DIAGNOSIS — C786 Secondary malignant neoplasm of retroperitoneum and peritoneum: Secondary | ICD-10-CM

## 2017-02-07 DIAGNOSIS — R5383 Other fatigue: Secondary | ICD-10-CM

## 2017-02-07 DIAGNOSIS — C801 Malignant (primary) neoplasm, unspecified: Secondary | ICD-10-CM

## 2017-02-07 LAB — C DIFFICILE QUICK SCREEN W PCR REFLEX
C DIFFICILE (CDIFF) INTERP: NOT DETECTED
C DIFFICILE (CDIFF) TOXIN: NEGATIVE
C Diff antigen: NEGATIVE

## 2017-02-08 ENCOUNTER — Ambulatory Visit: Payer: Medicare Other | Admitting: Oncology

## 2017-02-12 NOTE — Progress Notes (Signed)
Lisa [facility]den  Telephone:([phone]) [phone] Fax:(336) [phone]  ID: Lisa Hayden OB: July 19, 1930  MR#: [id number]  HYI#:[id number]  Patient Care Team: Birdie Sons, MD as PCP - General (Family Medicine) Clent Jacks, RN as Registered Nurse  CHIEF COMPLAINT: Peritoneal carcinomatosis.  INTERVAL HISTORY: Patient is an 83 year old female who was initially evaluated in the hospital prior to be transferred to [facility] to undergo surgical debulking of her peritoneal carcinomatosis. She continues to have increased weakness and fatigue, but is recovering well. She has no neurologic complaints. She denies any recent fevers. Her appetite is improving and she is gaining weight. She has no chest pain or shortness of breath. She denies any nausea, vomiting, constipation, or diarrhea. She has no abdominal pain. She has no urinary complaints. Patient offers no further specific complaints today.  REVIEW OF SYSTEMS:   Review of Systems  Constitutional: Positive for malaise/fatigue. Negative for fever and weight loss.  Respiratory: Negative.  Negative for cough and shortness of breath.   Cardiovascular: Negative.  Negative for chest pain and leg swelling.  Gastrointestinal: Negative.  Negative for abdominal pain.  Genitourinary: Negative.   Musculoskeletal: Negative.   Neurological: Positive for weakness.  Psychiatric/Behavioral: Negative.  The patient is not nervous/anxious.     As per HPI. Otherwise, a complete review of systems is negative.  PAST MEDICAL HISTORY: Past Medical History:  Diagnosis Date  . Asthma   . Cancer ([place])   . Hypertension   . Osteoporosis     PAST SURGICAL HISTORY: Past Surgical History:  Procedure Laterality Date  . 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 APPENDECTOMY  1953  . BREAST LUMPECTOMY Left 10/29/94   negative nodes. Treated with radiation only  . BREAST SURGERY    .  BUNIONECTOMY  1979  . CATARACT EXTRACTION, BILATERAL     left 07/29/2012; right 06/24/2012  . COLON SURGERY  05/1991   secondary to cancer   . ROTATOR CUFF REPAIR Right 01/25/10  . TONSILLECTOMY  1938   and adenoidectomy    FAMILY HISTORY: Family History  Problem Relation Age of Onset  . CVA Mother   . Congestive Heart Failure Mother   . Diabetes Sister   . Asthma Father     ADVANCED DIRECTIVES (Y/N):  N  HEALTH MAINTENANCE: Social History  Substance Use Topics  . Smoking status: Former Research scientist (life sciences)  . Smokeless tobacco: Never Used  . Alcohol use 4.2 oz/week    7 Standard drinks or equivalent per week     Comment: glass or beer at dinner     Colonoscopy:  PAP:  Bone density:  Lipid panel:  Allergies  Allergen Reactions  . Actonel [Risedronate Sodium] Other (See Comments)    headache  . Cortisone     very alert and did not sleep for 24 hrs  . Daypro  [Oxaprozin]   . Meloxicam     Current Outpatient Prescriptions  Medication Sig Dispense Refill  . alendronate (FOSAMAX) 70 MG tablet Take 1 tablet (70 mg total) by mouth once a week. On Sunday 12 tablet 4  . aspirin 81 MG chewable tablet Chew 81 mg by mouth daily.    . cholecalciferol (VITAMIN D) 1000 UNITS tablet Take 2,000 Units by mouth every evening. With dinner     . cholestyramine (QUESTRAN) 4 g packet Take 1 packet (4 g total) by mouth 3 (three) times daily with meals. 60 each 12   No  current facility-administered medications for this visit.     OBJECTIVE: Vitals:   02/13/17 1547  BP: 132/84  Pulse: 97  Resp: 18  Temp: 97.9 F (36.6 C)     Body mass index is 21.44 kg/m.    ECOG FS:0 - Asymptomatic  General: Well-developed, well-nourished, no acute distress. Eyes: Pink conjunctiva, anicteric sclera. HEENT: Normocephalic, moist mucous membranes, clear oropharnyx. Lungs: Clear to auscultation bilaterally. Heart: Regular rate and rhythm. No rubs, murmurs, or gallops. Abdomen: Soft, nontender, nondistended.  No organomegaly noted, normoactive bowel sounds. Musculoskeletal: No edema, cyanosis, or clubbing. Neuro: Alert, answering all questions appropriately. Cranial nerves grossly intact. Skin: No rashes or petechiae noted. Psych: Normal affect. Lymphatics: No cervical, calvicular, axillary or inguinal LAD.   LAB RESULTS:  Lab Results  Component Value Date   NA 132 (L) 02/06/2017   K 4.2 02/06/2017   CL 99 (L) 02/06/2017   CO2 23 02/06/2017   GLUCOSE 106 (H) 02/06/2017   BUN 23 (H) 02/06/2017   CREATININE 0.71 02/06/2017   CALCIUM 9.9 02/06/2017   PROT 7.4 02/13/2017   ALBUMIN 4.2 02/13/2017   AST 37 02/13/2017   ALT 43 02/13/2017   ALKPHOS 70 02/13/2017   BILITOT 0.6 02/13/2017   GFRNONAA >60 02/06/2017   GFRAA >60 02/06/2017    Lab Results  Component Value Date   WBC 8.0 02/06/2017   NEUTROABS 5.9 02/06/2017   HGB 12.4 02/06/2017   HCT 37.0 02/06/2017   MCV 88.6 02/06/2017   PLT 520 (H) 02/06/2017   Lab Results  Component Value Date   CA125 74.8 (H) 01/15/2017     STUDIES: Dg Abd 2 Views  Result Date: 01/21/2017 CLINICAL DATA:  Small bowel obstruction. EXAM: ABDOMEN - 2 VIEW COMPARISON:  01/15/2017.  01/20/2017. FINDINGS: Upright film shows no evidence for intraperitoneal free air. Small bowel air-fluid level seen previously have decreased in the interval. Interval decrease in gaseous distention of the stomach. The gaseous distention of small bowel has also decreased in the interval and nearly resolved. IMPRESSION: Interval substantial decrease in the gastric and small bowel distention seen on previous studies. Interval resolution of small bowel obstructive pattern on x-ray. Electronically Signed   By: Misty Stanley M.D.   On: 01/21/2017 10:15    ASSESSMENT: Peritoneal carcinomatosis  PLAN:    1. Peritoneal carcinomatosis: Given patient's stage of disease, she will likely benefit from adjuvant chemotherapy using carboplatinum and Taxol. We will consider dose  reducing given her advanced age. Can also consider single agent carboplatinum. She is still recovering from surgery, therefore she will return to clinic on March 06, 2017 for further evaluation and potential treatment planning. Patient has been instructed to keep her follow-up with gynecology oncology as scheduled.  Approximately 30 minutes was spent in discussion, which greater than 50% was consultation.  Patient expressed understanding and was in agreement with this plan. She also understands that She can call clinic at any time with any questions, concerns, or complaints.   Cancer Staging Peritoneal carcinomatosis [facility]) Staging form: Soft Tissue Sarcoma of the Abdomen and Thoracic Visceral Organs, AJCC 8th Edition - Clinical stage from 02/13/2017: cT4c, cN1, cM0 - Signed by Lloyd Huger, MD on 02/13/2017   Lloyd Huger, MD   02/19/2017 5:52 PM

## 2017-02-13 ENCOUNTER — Encounter: Payer: Self-pay | Admitting: Oncology

## 2017-02-13 ENCOUNTER — Inpatient Hospital Stay (HOSPITAL_BASED_OUTPATIENT_CLINIC_OR_DEPARTMENT_OTHER): Payer: Medicare Other | Admitting: Oncology

## 2017-02-13 ENCOUNTER — Inpatient Hospital Stay: Payer: Medicare Other

## 2017-02-13 VITALS — BP 132/84 | HR 97 | Temp 97.9°F | Resp 18 | Ht 62.0 in | Wt 117.2 lb

## 2017-02-13 DIAGNOSIS — Z90722 Acquired absence of ovaries, bilateral: Secondary | ICD-10-CM

## 2017-02-13 DIAGNOSIS — R531 Weakness: Secondary | ICD-10-CM

## 2017-02-13 DIAGNOSIS — I10 Essential (primary) hypertension: Secondary | ICD-10-CM | POA: Diagnosis not present

## 2017-02-13 DIAGNOSIS — R5383 Other fatigue: Secondary | ICD-10-CM | POA: Diagnosis not present

## 2017-02-13 DIAGNOSIS — E43 Unspecified severe protein-calorie malnutrition: Secondary | ICD-10-CM

## 2017-02-13 DIAGNOSIS — C786 Secondary malignant neoplasm of retroperitoneum and peritoneum: Secondary | ICD-10-CM | POA: Diagnosis not present

## 2017-02-13 DIAGNOSIS — C801 Malignant (primary) neoplasm, unspecified: Principal | ICD-10-CM

## 2017-02-13 DIAGNOSIS — Z79899 Other long term (current) drug therapy: Secondary | ICD-10-CM | POA: Diagnosis not present

## 2017-02-13 DIAGNOSIS — R5381 Other malaise: Secondary | ICD-10-CM

## 2017-02-13 DIAGNOSIS — M810 Age-related osteoporosis without current pathological fracture: Secondary | ICD-10-CM

## 2017-02-13 DIAGNOSIS — Z7982 Long term (current) use of aspirin: Secondary | ICD-10-CM

## 2017-02-13 DIAGNOSIS — Z90710 Acquired absence of both cervix and uterus: Secondary | ICD-10-CM | POA: Diagnosis not present

## 2017-02-13 DIAGNOSIS — Z853 Personal history of malignant neoplasm of breast: Secondary | ICD-10-CM

## 2017-02-13 LAB — HEPATIC FUNCTION PANEL
ALT: 43 U/L (ref 14–54)
AST: 37 U/L (ref 15–41)
Albumin: 4.2 g/dL (ref 3.5–5.0)
Alkaline Phosphatase: 70 U/L (ref 38–126)
BILIRUBIN TOTAL: 0.6 mg/dL (ref 0.3–1.2)
Total Protein: 7.4 g/dL (ref 6.5–8.1)

## 2017-02-13 NOTE — Progress Notes (Signed)
New pt here to discuss chemo.

## 2017-02-13 NOTE — Progress Notes (Signed)
START ON PATHWAY REGIMEN - Ovarian     A cycle is every 21 days:     Paclitaxel      Carboplatin   **Always confirm dose/schedule in your pharmacy ordering system**    Patient Characteristics: First Line Therapy, No Previous Neoadjuvant Therapy, Stage IIB and Stage IIIA/B/C Optimal* Cytoreduction AJCC T Category: T3c AJCC N Category: N1b AJCC M Category: M0 AJCC 8 Stage Grouping: IIIC Line of Therapy: First Line Therapy BRCA Mutation Status: Awaiting Test Results Would you be surprised if this patient died  in the next year? I would NOT be surprised if this patient died in the next year Previous Neoadjuvant Therapy? No  Intent of Therapy: Non-Curative / Palliative Intent, Discussed with Patient

## 2017-02-14 ENCOUNTER — Inpatient Hospital Stay: Payer: Medicare Other

## 2017-02-14 NOTE — Progress Notes (Signed)
Nutrition Assessment   Reason for Assessment:   Referral from Dr. Theora Gianotti for nutrition recommendations with recent diarrhea and GI surgery  ASSESSMENT:  83 year old female with peritoneal carcinomatosis.  Planning chemotherapy.  Past medical history reviewed and pt under went at [facility] on 3/7 underwent laparotomy with TAH BSO, omentectomy, small bowel resection and tumor debulking. Patient also with HTN, asthma and osteoporosis  Patient reports no appetite and did not have a good experience at rehab getting foods that she wanted and could tolerate.  Patient reports that she has been taking [place] and wants to stop taking it as she is not having diarrhea anymore.  Reports last night has rock hard small pebble like stools that she had to help remove with her finger in order to get relief.  Reports that she has been drinking ensure daily without GI symptoms.  Reports had oatmeal and 2 eggs with toast this am.  Unable to gather more information about typical day as hard to keep patient on track.     Nutrition Focused Physical Exam: Nutrition-Focused physical exam completed. Findings are moderate fat depletion under eyes,  moderate muscle depletion in temple region and hands, and no edema.    Medications: Vit D, questran  Labs: Na 132, glucose 106, BUN 23, creatinine WDL, Mag 2.0, Vit B 12 496  Anthropometrics:   Height: 62 inches Weight: 117 lb UBW: 133 lb.  Last at that weight noted in December 2017 BMI: 21  12% weight loss in the last 3 months, significant    Estimated Energy Needs  Kcals: 1600-1800 calories/d Protein: 64-80 g/d Fluid: 1.8 L/d  NUTRITION DIAGNOSIS:  Malnutrition severe related to recent surgeries including GI surgery, hospitalization, peritoneal cancer as evidenced by 12% weight loss in the last 3 months, poor appetite and moderate fat depletion and muscle mass loss   MALNUTRITION DIAGNOSIS: Patient meets criteria for severe malnutrition in context of chronic  illness as evidenced by 12% weight loss in the last 3 months and eating < or equal to 75% of estimated energy needs for > or equal to 1 month   INTERVENTION:   Encouraged patient to contact MD regarding wanting to stop questran medication.  Patient verbalized understanding Encouraged patient to keep food diary documenting foods that cause GI upset.  Patient is currently not having diarrhea. Discussed sugary foods, fluids during meal times and high fiber foods.   Encouraged small frequent meals with increased calories and protein. Discussed examples of foods.    Encouraged intake of oral nutrition supplements as not causing diarrhea. Discussed higher calorie and higher protein options, samples provided and coupons given.    MONITORING, EVALUATION, GOAL: Patient will consume adequate calories and protein to prevent further weight loss   NEXT VISIT: April 26   Erby Sanderson B. Zenia Resides, [place], [place] Registered Dietitian [phone] (pager)

## 2017-02-20 ENCOUNTER — Inpatient Hospital Stay: Payer: Medicare Other | Attending: Obstetrics and Gynecology

## 2017-02-20 DIAGNOSIS — Z7982 Long term (current) use of aspirin: Secondary | ICD-10-CM | POA: Insufficient documentation

## 2017-02-20 DIAGNOSIS — R5383 Other fatigue: Secondary | ICD-10-CM | POA: Insufficient documentation

## 2017-02-20 DIAGNOSIS — R531 Weakness: Secondary | ICD-10-CM | POA: Insufficient documentation

## 2017-02-20 DIAGNOSIS — C801 Malignant (primary) neoplasm, unspecified: Secondary | ICD-10-CM | POA: Insufficient documentation

## 2017-02-20 DIAGNOSIS — Z79899 Other long term (current) drug therapy: Secondary | ICD-10-CM | POA: Insufficient documentation

## 2017-02-20 DIAGNOSIS — R5381 Other malaise: Secondary | ICD-10-CM | POA: Insufficient documentation

## 2017-02-20 DIAGNOSIS — I10 Essential (primary) hypertension: Secondary | ICD-10-CM | POA: Insufficient documentation

## 2017-02-20 DIAGNOSIS — D72828 Other elevated white blood cell count: Secondary | ICD-10-CM | POA: Insufficient documentation

## 2017-02-20 DIAGNOSIS — Z5111 Encounter for antineoplastic chemotherapy: Secondary | ICD-10-CM | POA: Insufficient documentation

## 2017-02-20 DIAGNOSIS — M810 Age-related osteoporosis without current pathological fracture: Secondary | ICD-10-CM | POA: Insufficient documentation

## 2017-02-20 DIAGNOSIS — C786 Secondary malignant neoplasm of retroperitoneum and peritoneum: Secondary | ICD-10-CM | POA: Insufficient documentation

## 2017-02-20 DIAGNOSIS — J45909 Unspecified asthma, uncomplicated: Secondary | ICD-10-CM | POA: Insufficient documentation

## 2017-02-20 DIAGNOSIS — Z87891 Personal history of nicotine dependence: Secondary | ICD-10-CM | POA: Insufficient documentation

## 2017-02-20 DIAGNOSIS — Z823 Family history of stroke: Secondary | ICD-10-CM | POA: Insufficient documentation

## 2017-02-27 ENCOUNTER — Inpatient Hospital Stay: Payer: Medicare Other | Admitting: Obstetrics and Gynecology

## 2017-02-27 VITALS — BP 131/82 | HR 93 | Temp 98.2°F | Resp 18 | Wt 117.1 lb

## 2017-02-27 DIAGNOSIS — Z5111 Encounter for antineoplastic chemotherapy: Secondary | ICD-10-CM | POA: Diagnosis present

## 2017-02-27 DIAGNOSIS — C786 Secondary malignant neoplasm of retroperitoneum and peritoneum: Secondary | ICD-10-CM | POA: Diagnosis not present

## 2017-02-27 DIAGNOSIS — C5700 Malignant neoplasm of unspecified fallopian tube: Secondary | ICD-10-CM | POA: Insufficient documentation

## 2017-02-27 DIAGNOSIS — I10 Essential (primary) hypertension: Secondary | ICD-10-CM | POA: Diagnosis not present

## 2017-02-27 DIAGNOSIS — M810 Age-related osteoporosis without current pathological fracture: Secondary | ICD-10-CM | POA: Diagnosis not present

## 2017-02-27 DIAGNOSIS — D72828 Other elevated white blood cell count: Secondary | ICD-10-CM | POA: Diagnosis not present

## 2017-02-27 DIAGNOSIS — Z823 Family history of stroke: Secondary | ICD-10-CM | POA: Diagnosis not present

## 2017-02-27 DIAGNOSIS — Z79899 Other long term (current) drug therapy: Secondary | ICD-10-CM | POA: Diagnosis not present

## 2017-02-27 DIAGNOSIS — R5381 Other malaise: Secondary | ICD-10-CM | POA: Diagnosis not present

## 2017-02-27 DIAGNOSIS — R531 Weakness: Secondary | ICD-10-CM | POA: Diagnosis not present

## 2017-02-27 DIAGNOSIS — R5383 Other fatigue: Secondary | ICD-10-CM | POA: Diagnosis not present

## 2017-02-27 DIAGNOSIS — Z7982 Long term (current) use of aspirin: Secondary | ICD-10-CM | POA: Diagnosis not present

## 2017-02-27 DIAGNOSIS — C801 Malignant (primary) neoplasm, unspecified: Secondary | ICD-10-CM | POA: Diagnosis not present

## 2017-02-27 DIAGNOSIS — J45909 Unspecified asthma, uncomplicated: Secondary | ICD-10-CM | POA: Diagnosis not present

## 2017-02-27 DIAGNOSIS — Z87891 Personal history of nicotine dependence: Secondary | ICD-10-CM | POA: Diagnosis not present

## 2017-02-27 NOTE — Progress Notes (Signed)
Gynecologic Oncology Interval Visit   Referring Provider: Benjaman Kindler, MD  Chief Concern: postoperative visit h/o advanced tubal cancer.   Subjective:  Lisa Hayden is a 83 y.o. female who is seen in consultation from Dr. 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    Her bowel movements have improved and she actually complaints of episodes of constipation with the Questran. Therefore she has stopped that therapy. She has not taken her medication including vitamin B12 or electrolytes, but she has no concerning symptoms. She is on a supplement daily and has been able to maintain her weight. She is using her anti-coagulation and has 4 days left.   Lab Results  Component Value Date   CA125 74.8 (H) 01/15/2017    She presents to discuss subsequent management.    Gynecologic Oncology History Lisa Hayden is a 83 y.o. female who is seen in consultation from Dr. 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 She was transferred to [facility] for care.   CT scan abdomen and pelvis on 01/15/2017 IMPRESSION: Cholelithiasis and distal small bowel obstruction is noted with transition zone seen in the pelvis, most likely due to adjacent peritoneal implant or metastatic disease. Omental caking is seen in left lower quadrant, with another peritoneal implant seen in left posterior pelvis. This is concerning for peritoneal carcinomatosis.  CXR; negative for metastasis  She was transferred to [facility] and on 01/23/3017 underwent a diagnostic laparoscopy conversion to laparotomy with TAH BSO, omentectomy, SBRxRA and optimal tumor debulking.   Pathology  A. 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  B. Small bowel, partial  resection: Metastatic high grade serous carcinoma. Two lymph nodes, all negative for metastatic carcinoma (0/2).  C. Colon mesentery nodule, resection: Metastatic high grade serous carcinoma.  D. 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 Ovary with no pathologic diagnosis.  E. Right ovary and right fallopian tube, right salpingo-oophorectomy: Ovary and fallopian tube with no pathologic diagnosis. Negative for malignancy.  F. Uterus, hysterectomy: Uterus (50 grams): Endometrium:Inactive Myometrium: No pathologic diagnosis. Cervix: Negative for malignancy. Serosa: Negative for malignancy.   OVARY or FALLOPIAN TUBE: Oophorectomy, Salpingectomy, Salpingo-Oophorectomy, Subtotal Oophorectomy or Removal of Tumor in Fragments, Hysterectomy With Salpingo-Oophorectomy or Salpingectomy(Ovary FT - All Specimens)    Genetic testing: pending, deferred to Dr. Grayland Ormond  Problem List: Patient Active Problem List   Diagnosis Date Noted  . Carcinoma of fallopian tube ([place]) 02/27/2017  . Protein-calorie malnutrition, severe 01/18/2017  . Peritoneal carcinomatosis ([place]) 01/16/2017  . SBO (small bowel obstruction) 01/16/2017  . Small bowel obstruction   . Malignant neoplasm of colon ([place]) 05/08/2016  . Reactive airway disease 05/04/2015  . Bronchitis 05/04/2015  . Asthma without status asthmaticus 05/03/2015  . Back ache 05/03/2015  . Body mass index (BMI) of 24.0-24.9 in adult 05/03/2015  . Breast CA ([place]) 05/03/2015  . Blood pressure elevated 05/03/2015  . Foot pain 05/03/2015  . BP (high blood pressure) 05/03/2015  . Leg pain 05/03/2015  . Lichen sclerosus of female genitalia 05/03/2015  . Neutropenia ([place]) 05/03/2015  . Tendinitis of wrist 05/03/2015  . Atrophy of vagina 05/03/2015  . Avitaminosis D 05/03/2015  . CN (constipation)  01/11/2010  . Mechanical and motor  problems with internal organs 09/21/2009  . Arthralgia of shoulder 08/31/2009  . Personal history of malignant neoplasm of breast 04/13/2009  . OP (osteoporosis) 04/13/2009    Past Medical History: Past Medical History:  Diagnosis Date  . Asthma   . Cancer ([place])   . Hypertension   . Osteoporosis     Past Surgical History: Past Surgical History:  Procedure Laterality Date  . 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 APPENDECTOMY  1953  . BREAST LUMPECTOMY Left 10/29/94   negative nodes. Treated with radiation only  . BREAST SURGERY    . BUNIONECTOMY  1979  . CATARACT EXTRACTION, BILATERAL     left 07/29/2012; right 06/24/2012  . COLON SURGERY  05/1991   secondary to cancer   . ROTATOR CUFF REPAIR Right 01/25/10  . TONSILLECTOMY  1938   and adenoidectomy    Past Gynecologic History:  As per interval history  OB History:  OB History  Gravida Para Term Preterm AB Living  1 1          SAB TAB Ectopic Multiple Live Births               # Outcome Date GA Lbr Len/2nd Weight Sex Delivery Anes PTL Lv  1 Para               Family History: Family History  Problem Relation Age of Onset  . CVA Mother   . Congestive Heart Failure Mother   . Diabetes Sister   . Asthma Father     Social History: Social History   Social History  . Marital status: Married    Spouse name: [name]  . Number of children: 1  . Years of education: College   Occupational History  . Retired    Social History Main Topics  . Smoking status: Former Research scientist (life sciences)  . Smokeless tobacco: Never Used  . Alcohol use 4.2 oz/week    7 Standard drinks or equivalent per week     Comment: glass or beer at dinner  . Drug use: No  . Sexual activity: Not on file   Other Topics Concern  . Not on file   Social History Narrative  . No narrative on file    Allergies: Allergies  Allergen Reactions  . Actonel [Risedronate  Sodium] Other (See Comments)    headache  . Cortisone     very alert and did not sleep for 24 hrs  . Daypro  [Oxaprozin]   . Meloxicam     Current Medications: Current Outpatient Prescriptions  Medication Sig Dispense Refill  . alendronate (FOSAMAX) 70 MG tablet Take 1 tablet (70 mg total) by mouth once a week. On Sunday 12 tablet 4  . aspirin 81 MG chewable tablet Chew 81 mg by mouth daily.    . cholecalciferol (VITAMIN D) 1000 UNITS tablet Take 2,000 Units by mouth every evening. With dinner     . cholestyramine (QUESTRAN) 4 g packet Take 1 packet (4 g total) by mouth 3 (three) times daily with meals. 60 each 12   No current facility-administered medications for this visit.     Review of Systems General: negative  Skin: wound healed; extensive bruising due to lovenox HEENT: negative for, change in hearing, pain Pulmonary: negative for, dyspnea, productive cough Cardiac: negative for, pain Gastrointestinal: positive for diarrhea and alternating constipation that have improved. No nausea or vomiting Genitourinary/Sexual: negative for, dysuria Ob/Gyn: negative  Musculoskeletal: negative  Hematology: easy bruising Neurologic/Psych: negative for, depression  Objective:  Physical Examination:  BP 131/82 (BP Location: Right Arm, Patient Position: Sitting)   Pulse 93   Temp 98.2 F (36.8 C) (Tympanic)   Resp 18   Wt 117 lb 1.6 oz (53.1 kg)   BMI 21.42 kg/m    ECOG Performance Status: 1 - Symptomatic but completely ambulatory  General appearance: alert, cooperative and appears stated age HEENT:PERRLA, extra ocular movement intact and sclera clear, anicteric Lymph node survey: non-palpable, axillary, inguinal, supraclavicular Cardiovascular: regular rate and rhythm Respiratory: normal air entry, lungs clear to auscultation Abdomen: soft, non-tender, without masses or organomegaly, no hernias and well healed incision. The RLQ port incision is puckered and new nodularity around  the port site.  Back: inspection of back is normal Extremities: extremities normal, atraumatic, no cyanosis or edema Skin exam - extensive abdominal bruising Neurological exam reveals alert, oriented, normal speech, no focal findings or movement disorder noted.  Pelvic exam: exam chaperoned VULVA: normal appearing vulva with no masses, tenderness or lesions, VAGINA: normal appearing vagina with normal color and discharge, no lesions, CERVIX: surgically absent, UTERUS: surgically absent, vaginal cuff well healed, ADNEXA: no masses, surgically absent bilateral, RECTAL:  Deferred.     Lab Review C. Diff negative.   CMP     Component Value Date/Time   NA 132 (L) 02/06/2017 1248   NA 142 05/09/2016 0953   K 4.2 02/06/2017 1248   CL 99 (L) 02/06/2017 1248   CO2 23 02/06/2017 1248   GLUCOSE 106 (H) 02/06/2017 1248   BUN 23 (H) 02/06/2017 1248   BUN 13 05/09/2016 0953   CREATININE 0.71 02/06/2017 1248   CALCIUM 9.9 02/06/2017 1248   PROT 7.4 02/13/2017 1500   PROT 6.5 05/09/2016 0953   ALBUMIN 4.2 02/13/2017 1500   ALBUMIN 4.3 05/09/2016 0953   AST 37 02/13/2017 1500   ALT 43 02/13/2017 1500   ALKPHOS 70 02/13/2017 1500   BILITOT 0.6 02/13/2017 1500   BILITOT 0.9 05/09/2016 0953   GFRNONAA >60 02/06/2017 1248   GFRAA >60 02/06/2017 1248   Mag 2.0  Vitamin B12  496 normal  Lab Results  Component Value Date   WBC 8.0 02/06/2017   HGB 12.4 02/06/2017   HCT 37.0 02/06/2017   MCV 88.6 02/06/2017   PLT 520 (H) 02/06/2017     Radiologic Imaging: n/a    Assessment:  Lisa Hayden is a 83 y.o. female diagnosed with advanced HGS fallopian tube cancer s/p exploratory laparotomy with TAH BSO, omentectomy, SBRxRA and optimal tumor debulking.  Exam concerning for possible disease progression involving the LLQ port, or this could represent inflammation.   Diarrhea due to small bowel resection, improved. Bruising due to anticoagulation.   Medical co-morbidities complicating  care: Protein-calorie malnutrition, severe; h/o Malignant neoplasm of colon ([place]) and breast cancer; Asthma; osteoporosis, and frailty.  Plan:   Problem List Items Addressed This Visit      Genitourinary   Carcinoma of fallopian tube ([place]) - Primary     The patient and her husband had many questions for me about her treatment. She has not yet started chemotherapy and I hope this gets started soon. Typically I like to start within 3 weeks of surgery for maximal disease control. Given her comorbidities I would start chemotherapy with either Carboplatin AUC 5, Paclitaxel 135mg /m2 plus pegfilgrastim or filgrastim (G-CSF) every 3 weeks or single agent Carboplatin AUC either 4 or 5. I would treat with at least 6 cycles  of therapy if she can tolerate that many cycles. In GOG273, 4 cycles were used to identify their endpoint to assess if there was an association of baseline Instrumental Activities of Daily Living (IADL) score with ability to complete 4 cycles of first line chemotherapy without dose reductions or >7days delay in elderly ovarian cancer patients. The addition of further therapy was at the discretion of the treating physician.  If she has a complete response there may be consolidative/maintenance clinical trials she is interested in participating.    Chemotherapy completion in elderly women with ovarian, primary peritoneal or fallopian tube cancer - An NRG oncology/Gynecologic Oncology Group study. [place] VE1, [address] [place] JH3, [place] HA4, [place] W5, [place], [place] A7, [place], [place] T9, [place] LM10, Legrand Rams QH47, [place] TJ14, [employer], Sevyn Paredez AA16, [place] G17.  Author information Abstract PURPOSE:  A simple measure to predict chemotherapy tolerance in elderly patients would be useful. We prospectively tested the association of baseline Instrumental Activities of Daily Living (IADL) score with ability to complete 4 cycles of first line  chemotherapy without dose reductions or >7days delay in elderly ovarian cancer patients. PATIENTS AND METHODS:  Patients' age ?41 along with their physicians chose between two regimens: CP (Carboplatin AUC 5, Paclitaxel 135mg /m2) or C (Carboplatin AUC 5), both given every 3weeks either after primary surgery or as neoadjuvant chemotherapy (NACT) with IADL and quality of life assessments performed at baseline, pre-cycle 3, and post-cycle 4. RESULTS:  Two-hundred-twelve women were enrolled, 152 selecting CP and 60 selecting C. Those who selected CP had higher baseline IADL scores (p<0.001). After adjusting for age and PS, baseline IADL was independently associated with the choice of regimen (p=0.035). The baseline IADL score was not found to be associated with completion of 4 cycles of chemotherapy without dose reduction or delays (p=0.21), but was associated with completion of 4 cycles of chemotherapy regardless of dose reduction and delay (p=0.008) and toxicity, with the odds ratio (OR) of grade 3+ toxicity decreasing 17% (OR: 0.83; 95%CI: 0.72-0.96; p=0.013) for each additional activity in which the patient was independent. After adjustment for chemotherapy regimen, IADL was also associated with overall survival (p=0.019) for patients receiving CP. CONCLUSION:  Patients with a higher baseline IADL score (more independent) were more likely to complete 4 cycles of chemotherapy and less likely to experience grade 3 or higher toxicity.  She will follow up in 3 months for surveillance. She will need to have her vitamin B12 levels checked intermittently given the bowel resection she is at risk for vitamin B12 deficiency.   Genetic testing deferred to Dr. Grayland Ormond.   The patient's diagnosis, an outline of the further diagnostic and laboratory studies which will be required, the recommendation, and alternatives were discussed.  All questions were answered to the patient's satisfaction.  A total of 60 minutes  were spent with the patient/family today; 75% was spent in education, counseling and coordination of care for tubal cancer.      Gillis Ends, MD   CC:  Benjaman Kindler, MD

## 2017-02-27 NOTE — Progress Notes (Signed)
Patient is here for follow up, she says she has had a hard time with bowel movements since April 8th. She mentions some vaginal spotting but its very light.

## 2017-02-28 NOTE — Patient Instructions (Signed)

## 2017-03-01 ENCOUNTER — Inpatient Hospital Stay: Payer: Medicare Other

## 2017-03-01 ENCOUNTER — Telehealth: Payer: Self-pay | Admitting: *Deleted

## 2017-03-01 NOTE — Telephone Encounter (Signed)
Called pt to let her know that I changed her appt 4/18 to 8:15 for labs and see md 8:30 and then chemo if md is agreeable.  Pt had seen [place] earlier this week and she thought pt had already started chemo and pt had told her that Grayland Ormond was waiting on clearance from surgery and to get chemo teaching and to get port.  Dr. Theora Gianotti told her that she does not need a port and that she should get started ASAP. She is 5 weeks post surgery and she should have been started on chemo at 3 weeks. I had scheduled her for 4/13 for chemo class and then [facility] in chemo class told her that she would love to have a port.  I told pt that she was already on sch. For labs and see md on 4/18 I just moved the appt up for labs and md and treatment if finnegan and pt reeady per the request of [place].  Patient agreeable to the appt and will be here and she thinks she may want a port.  I told her she should speak to [facility] and maybe she can get chemo this time peripheral and then a few days before next treatment she could get port placed but it is a conversation she will need to have with Finnegan and she is ok with that.

## 2017-03-05 NOTE — Progress Notes (Signed)
[place]  [phone] [phone]) [phone]  ID: [name] OB: 1930/05/12  MR#: [id number]  QTM#:[id number]  Patient Care Team: Birdie Sons, MD as PCP - General (Family Medicine) Clent Jacks, RN as Registered Nurse  CHIEF COMPLAINT: Peritoneal carcinomatosis.  INTERVAL HISTORY: Patient returns to clinic today for further evaluation and initiation of cycle 1 of carboplatinum and Taxol. She continues to have increased weakness and fatigue. She has no neurologic complaints. She denies any recent fevers. Her appetite is improving and she is gaining weight. She has no chest pain or shortness of breath. She denies any nausea, vomiting, constipation, or diarrhea. She has no abdominal pain. She has no urinary complaints. Patient offers no further specific complaints today.  REVIEW OF SYSTEMS:   Review of Systems  Constitutional: Positive for malaise/fatigue. Negative for fever and weight loss.  Respiratory: Negative.  Negative for cough and shortness of breath.   Cardiovascular: Negative.  Negative for chest pain and leg swelling.  Gastrointestinal: Negative.  Negative for abdominal pain.  Genitourinary: Negative.   Musculoskeletal: Negative.   Neurological: Positive for weakness.  Psychiatric/Behavioral: Negative.  The patient is not nervous/anxious.     As per HPI. Otherwise, a complete review of systems is negative.  PAST MEDICAL HISTORY: Past Medical History:  Diagnosis Date  . Asthma   . Cancer ([place])   . Hypertension   . Osteoporosis     PAST SURGICAL HISTORY: Past Surgical History:  Procedure Laterality Date  . 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 APPENDECTOMY  1953  . BREAST LUMPECTOMY Left 10/29/94   negative nodes. Treated with radiation only  . BREAST SURGERY    . BUNIONECTOMY  1979  . CATARACT EXTRACTION, BILATERAL     left 07/29/2012; right 06/24/2012  . COLON SURGERY   05/1991   secondary to cancer   . ROTATOR CUFF REPAIR Right 01/25/10  . TONSILLECTOMY  1938   and adenoidectomy    FAMILY HISTORY: Family History  Problem Relation Age of Onset  . CVA Mother   . Congestive Heart Failure Mother   . Diabetes Sister   . Asthma Father     ADVANCED DIRECTIVES (Y/N):  N  HEALTH MAINTENANCE: Social History  Substance Use Topics  . Smoking status: Former Research scientist (life sciences)  . Smokeless tobacco: Never Used  . Alcohol use 4.2 oz/week    7 Standard drinks or equivalent per week     Comment: glass or beer at dinner     Colonoscopy:  PAP:  Bone density:  Lipid panel:  Allergies  Allergen Reactions  . Actonel [Risedronate Sodium] Other (See Comments)    headache  . Cortisone     very alert and did not sleep for 24 hrs  . Daypro  [Oxaprozin]   . Meloxicam     Current Outpatient Prescriptions  Medication Sig Dispense Refill  . alendronate (FOSAMAX) 70 MG tablet Take 1 tablet (70 mg total) by mouth once a week. On Sunday 12 tablet 4  . aspirin 81 MG chewable tablet Chew 81 mg by mouth daily.    . cholecalciferol (VITAMIN D) 1000 UNITS tablet Take 2,000 Units by mouth every evening. With dinner     . potassium chloride SA (K-DUR,KLOR-CON) 20 MEQ tablet Take by mouth.    . ondansetron (ZOFRAN) 8 MG tablet Take 1 tablet (8 mg total) by mouth every 8 (eight) hours as needed for nausea or vomiting. 60 tablet  0  . prochlorperazine (COMPAZINE) 10 MG tablet Take 1 tablet (10 mg total) by mouth every 6 (six) hours as needed for nausea or vomiting. 60 tablet 0   No current facility-administered medications for this visit.     OBJECTIVE: Vitals:   03/06/17 0901  BP: (!) 146/84  Pulse: 95  Resp: 18  Temp: 97.6 F (36.4 C)     Body mass index is 21.4 kg/m.    ECOG FS:0 - Asymptomatic  General: Well-developed, well-nourished, no acute distress. Eyes: Pink conjunctiva, anicteric sclera. Lungs: Clear to auscultation bilaterally. Heart: Regular rate and rhythm.  No rubs, murmurs, or gallops. Abdomen: Soft, nontender, nondistended. No organomegaly noted, normoactive bowel sounds. Musculoskeletal: No edema, cyanosis, or clubbing. Neuro: Alert, answering all questions appropriately. Cranial nerves grossly intact. Skin: No rashes or petechiae noted. Psych: Normal affect.   LAB RESULTS:  Lab Results  Component Value Date   NA 138 03/06/2017   K 3.8 03/06/2017   CL 106 03/06/2017   CO2 25 03/06/2017   GLUCOSE 86 03/06/2017   BUN 15 03/06/2017   CREATININE 0.62 03/06/2017   CALCIUM 9.3 03/06/2017   PROT 6.9 03/06/2017   ALBUMIN 4.1 03/06/2017   AST 27 03/06/2017   ALT 38 03/06/2017   ALKPHOS 98 03/06/2017   BILITOT 1.0 03/06/2017   GFRNONAA >60 03/06/2017   GFRAA >60 03/06/2017    Lab Results  Component Value Date   WBC 3.9 03/06/2017   NEUTROABS 2.7 03/06/2017   HGB 12.5 03/06/2017   HCT 36.6 03/06/2017   MCV 89.5 03/06/2017   PLT 319 03/06/2017   Lab Results  Component Value Date   CA125 74.8 (H) 01/15/2017     STUDIES: No results found.  ASSESSMENT: Peritoneal carcinomatosis  PLAN:    1. Peritoneal carcinomatosis: Given patient's stage of disease, she will  benefit from adjuvant chemotherapy using carboplatinum and Taxol. Patient has declined port placement at this time, but will reconsider in the future.  Proceed with carboplatinum AUC 5 and dose reduced Taxol 135 mg/m every 3 weeks with Neulasta support. If patient cannot tolerate this regimen, can consider single agent carboplatinum. Plan on doing 4 cycles and then reimage. If patient switches to single agent carboplatinum will plan on 6 cycles. Return to clinic in 1 week for laboratory work and assess her toleration of treatment and then in 3 weeks for consideration of cycle 2.  Patient has been instructed to keep her follow-up with gynecology oncology as scheduled.  Approximately 30 minutes was spent in discussion, which greater than 50% was consultation.  Patient  expressed understanding and was in agreement with this plan. She also understands that She can call clinic at any time with any questions, concerns, or complaints.   Cancer Staging Peritoneal carcinomatosis [facility]) Staging form: Soft Tissue Sarcoma of the Abdomen and Thoracic Visceral Organs, AJCC 8th Edition - Clinical stage from 02/13/2017: cT4c, cN1, cM0 - Signed by Lloyd Huger, MD on 02/13/2017   Lloyd Huger, MD   03/11/2017 1:34 PM

## 2017-03-06 ENCOUNTER — Inpatient Hospital Stay (HOSPITAL_BASED_OUTPATIENT_CLINIC_OR_DEPARTMENT_OTHER): Payer: Medicare Other | Admitting: Oncology

## 2017-03-06 ENCOUNTER — Inpatient Hospital Stay: Payer: Medicare Other

## 2017-03-06 VITALS — BP 146/84 | HR 95 | Temp 97.6°F | Resp 18 | Wt 117.0 lb

## 2017-03-06 VITALS — BP 116/76 | HR 74 | Resp 20

## 2017-03-06 DIAGNOSIS — Z823 Family history of stroke: Secondary | ICD-10-CM

## 2017-03-06 DIAGNOSIS — C786 Secondary malignant neoplasm of retroperitoneum and peritoneum: Secondary | ICD-10-CM | POA: Diagnosis not present

## 2017-03-06 DIAGNOSIS — R5381 Other malaise: Secondary | ICD-10-CM | POA: Diagnosis not present

## 2017-03-06 DIAGNOSIS — J45909 Unspecified asthma, uncomplicated: Secondary | ICD-10-CM

## 2017-03-06 DIAGNOSIS — Z79899 Other long term (current) drug therapy: Secondary | ICD-10-CM

## 2017-03-06 DIAGNOSIS — R5383 Other fatigue: Secondary | ICD-10-CM

## 2017-03-06 DIAGNOSIS — C801 Malignant (primary) neoplasm, unspecified: Secondary | ICD-10-CM | POA: Diagnosis not present

## 2017-03-06 DIAGNOSIS — Z87891 Personal history of nicotine dependence: Secondary | ICD-10-CM | POA: Diagnosis not present

## 2017-03-06 DIAGNOSIS — I10 Essential (primary) hypertension: Secondary | ICD-10-CM | POA: Diagnosis not present

## 2017-03-06 DIAGNOSIS — R531 Weakness: Secondary | ICD-10-CM

## 2017-03-06 DIAGNOSIS — Z5111 Encounter for antineoplastic chemotherapy: Secondary | ICD-10-CM | POA: Diagnosis not present

## 2017-03-06 DIAGNOSIS — Z7982 Long term (current) use of aspirin: Secondary | ICD-10-CM

## 2017-03-06 LAB — CBC WITH DIFFERENTIAL/PLATELET
BASOS ABS: 0 10*3/uL (ref 0–0.1)
BASOS PCT: 1 %
EOS ABS: 0.1 10*3/uL (ref 0–0.7)
EOS PCT: 4 %
HCT: 36.6 % (ref 35.0–47.0)
HEMOGLOBIN: 12.5 g/dL (ref 12.0–16.0)
LYMPHS ABS: 0.7 10*3/uL — AB (ref 1.0–3.6)
LYMPHS PCT: 18 %
MCH: 30.5 pg (ref 26.0–34.0)
MCHC: 34.1 g/dL (ref 32.0–36.0)
MCV: 89.5 fL (ref 80.0–100.0)
MONO ABS: 0.4 10*3/uL (ref 0.2–0.9)
MONOS PCT: 9 %
NEUTROS ABS: 2.7 10*3/uL (ref 1.4–6.5)
Neutrophils Relative %: 68 %
PLATELETS: 319 10*3/uL (ref 150–440)
RBC: 4.09 MIL/uL (ref 3.80–5.20)
RDW: 14.8 % — ABNORMAL HIGH (ref 11.5–14.5)
WBC: 3.9 10*3/uL (ref 3.6–11.0)

## 2017-03-06 LAB — MAGNESIUM: Magnesium: 2 mg/dL (ref 1.7–2.4)

## 2017-03-06 LAB — COMPREHENSIVE METABOLIC PANEL
ALBUMIN: 4.1 g/dL (ref 3.5–5.0)
ALT: 38 U/L (ref 14–54)
ANION GAP: 7 (ref 5–15)
AST: 27 U/L (ref 15–41)
Alkaline Phosphatase: 98 U/L (ref 38–126)
BUN: 15 mg/dL (ref 6–20)
CHLORIDE: 106 mmol/L (ref 101–111)
CO2: 25 mmol/L (ref 22–32)
CREATININE: 0.62 mg/dL (ref 0.44–1.00)
Calcium: 9.3 mg/dL (ref 8.9–10.3)
GFR calc non Af Amer: >60 mL/min (ref >60)
GLUCOSE: 86 mg/dL (ref 65–99)
Potassium: 3.8 mmol/L (ref 3.5–5.1)
Sodium: 138 mmol/L (ref 135–145)
Total Bilirubin: 1 mg/dL (ref 0.3–1.2)
Total Protein: 6.9 g/dL (ref 6.5–8.1)

## 2017-03-06 MED ORDER — DEXAMETHASONE SODIUM PHOSPHATE 10 MG/ML IJ SOLN
10.0000 mg | Freq: Once | INTRAMUSCULAR | Status: AC
  Administered 2017-03-06: 10 mg via INTRAVENOUS
  Filled 2017-03-06: qty 1

## 2017-03-06 MED ORDER — ONDANSETRON HCL 8 MG PO TABS: 8.0000 mg | ORAL_TABLET | Freq: Three times a day (TID) | ORAL | 0 refills | Status: DC | PRN

## 2017-03-06 MED ORDER — DEXAMETHASONE SODIUM PHOSPHATE 100 MG/10ML IJ SOLN: 10.0000 mg | Freq: Once | INTRAMUSCULAR | Status: DC

## 2017-03-06 MED ORDER — SODIUM CHLORIDE 0.9 % IV SOLN
301.0000 mg | Freq: Once | INTRAVENOUS | Status: AC
  Administered 2017-03-06: 300 mg via INTRAVENOUS
  Filled 2017-03-06: qty 30

## 2017-03-06 MED ORDER — PROCHLORPERAZINE MALEATE 10 MG PO TABS: 10.0000 mg | ORAL_TABLET | Freq: Four times a day (QID) | ORAL | 0 refills | Status: DC | PRN

## 2017-03-06 MED ORDER — PACLITAXEL CHEMO INJECTION 300 MG/50ML
135.0000 mg/m2 | Freq: Once | INTRAVENOUS | Status: AC
  Administered 2017-03-06: 204 mg via INTRAVENOUS
  Filled 2017-03-06: qty 34

## 2017-03-06 MED ORDER — PEGFILGRASTIM 6 MG/0.6ML ~~LOC~~ PSKT
6.0000 mg | PREFILLED_SYRINGE | Freq: Once | SUBCUTANEOUS | Status: AC
  Administered 2017-03-06: 6 mg via SUBCUTANEOUS
  Filled 2017-03-06: qty 0.6

## 2017-03-06 MED ORDER — FAMOTIDINE IN NACL 20-0.9 MG/50ML-% IV SOLN
20.0000 mg | Freq: Once | INTRAVENOUS | Status: AC
  Administered 2017-03-06: 20 mg via INTRAVENOUS
  Filled 2017-03-06: qty 50

## 2017-03-06 MED ORDER — PALONOSETRON HCL INJECTION 0.25 MG/5ML
0.2500 mg | Freq: Once | INTRAVENOUS | Status: AC
  Administered 2017-03-06: 0.25 mg via INTRAVENOUS
  Filled 2017-03-06: qty 5

## 2017-03-06 MED ORDER — SODIUM CHLORIDE 0.9 % IV SOLN
Freq: Once | INTRAVENOUS | Status: AC
  Administered 2017-03-06: 10:00:00 via INTRAVENOUS
  Filled 2017-03-06: qty 1000

## 2017-03-06 MED ORDER — DIPHENHYDRAMINE HCL 50 MG/ML IJ SOLN
25.0000 mg | Freq: Once | INTRAMUSCULAR | Status: AC
  Administered 2017-03-06: 25 mg via INTRAVENOUS
  Filled 2017-03-06: qty 1

## 2017-03-06 NOTE — Progress Notes (Signed)
Offers no complaints. States is ready to start treatment today.

## 2017-03-13 NOTE — Progress Notes (Signed)
[place]  Telephone:(336) [phone] Fax:(336) [phone]  ID: Lisa Hayden OB: 1930/11/03  MR#: [id number]  MBW#:[id number]  Patient Care Team: Birdie Sons, MD as PCP - General (Family Medicine) Clent Jacks, RN as Registered Nurse  CHIEF COMPLAINT: Peritoneal carcinomatosis.  INTERVAL HISTORY: Patient returns to clinic today for further evaluation and to assess her toleration of cycle 1 of carboplatinum and Taxol. She tolerated her treatment well without significant side effects. She does not complain of weakness or fatigue today. She has no neurologic complaints. She denies any recent fevers. She has a good appetite and has gained weight. She has no chest pain or shortness of breath. She denies any nausea, vomiting, constipation, or diarrhea. She has no abdominal pain. She has no urinary complaints. Patient offers no specific complaints today.  REVIEW OF SYSTEMS:   Review of Systems  Constitutional: Negative for fever, malaise/fatigue and weight loss.  Respiratory: Negative.  Negative for cough and shortness of breath.   Cardiovascular: Negative.  Negative for chest pain and leg swelling.  Gastrointestinal: Negative.  Negative for abdominal pain.  Genitourinary: Negative.   Musculoskeletal: Negative.   Skin: Negative.  Negative for rash.  Neurological: Negative for sensory change and weakness.  Psychiatric/Behavioral: Negative.  The patient is not nervous/anxious.     As per HPI. Otherwise, a complete review of systems is negative.  PAST MEDICAL HISTORY: Past Medical History:  Diagnosis Date  . Asthma   . Cancer ([place])   . Hypertension   . Osteoporosis     PAST SURGICAL HISTORY: Past Surgical History:  Procedure Laterality Date  . 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 APPENDECTOMY  1953  . BREAST LUMPECTOMY Left 10/29/94   negative nodes. Treated with radiation only  . BREAST SURGERY    .  BUNIONECTOMY  1979  . CATARACT EXTRACTION, BILATERAL     left 07/29/2012; right 06/24/2012  . COLON SURGERY  05/1991   secondary to cancer   . ROTATOR CUFF REPAIR Right 01/25/10  . TONSILLECTOMY  1938   and adenoidectomy    FAMILY HISTORY: Family History  Problem Relation Age of Onset  . CVA Mother   . Congestive Heart Failure Mother   . Diabetes Sister   . Asthma Father     ADVANCED DIRECTIVES (Y/N):  N  HEALTH MAINTENANCE: Social History  Substance Use Topics  . Smoking status: Former Research scientist (life sciences)  . Smokeless tobacco: Never Used  . Alcohol use 4.2 oz/week    7 Standard drinks or equivalent per week     Comment: glass or beer at dinner     Colonoscopy:  PAP:  Bone density:  Lipid panel:  Allergies  Allergen Reactions  . Actonel [Risedronate Sodium] Other (See Comments)    headache  . Cortisone     very alert and did not sleep for 24 hrs  . Daypro  [Oxaprozin]   . Meloxicam     Current Outpatient Prescriptions  Medication Sig Dispense Refill  . alendronate (FOSAMAX) 70 MG tablet Take 1 tablet (70 mg total) by mouth once a week. On Sunday 12 tablet 4  . aspirin 81 MG chewable tablet Chew 81 mg by mouth daily.    . cholecalciferol (VITAMIN D) 1000 UNITS tablet Take 2,000 Units by mouth every evening. With dinner     . ondansetron (ZOFRAN) 8 MG tablet Take 1 tablet (8 mg total) by mouth every 8 (eight) hours as needed  for nausea or vomiting. 60 tablet 0  . potassium chloride SA (K-DUR,KLOR-CON) 20 MEQ tablet Take by mouth.    . prochlorperazine (COMPAZINE) 10 MG tablet Take 1 tablet (10 mg total) by mouth every 6 (six) hours as needed for nausea or vomiting. 60 tablet 0   No current facility-administered medications for this visit.     OBJECTIVE: Vitals:   03/14/17 1034  BP: 138/75  Pulse: 87  Resp: 18  Temp: 98.5 F (36.9 C)     Body mass index is 21.67 kg/m.    ECOG FS:0 - Asymptomatic  General: Well-developed, well-nourished, no acute distress. Eyes: Pink  conjunctiva, anicteric sclera. Lungs: Clear to auscultation bilaterally. Heart: Regular rate and rhythm. No rubs, murmurs, or gallops. Abdomen: Soft, nontender, nondistended. No organomegaly noted, normoactive bowel sounds. Musculoskeletal: No edema, cyanosis, or clubbing. Neuro: Alert, answering all questions appropriately. Cranial nerves grossly intact. Skin: No rashes or petechiae noted. Psych: Normal affect.   LAB RESULTS:  Lab Results  Component Value Date   NA 139 03/14/2017   K 3.6 03/14/2017   CL 104 03/14/2017   CO2 27 03/14/2017   GLUCOSE 87 03/14/2017   BUN 14 03/14/2017   CREATININE 0.65 03/14/2017   CALCIUM 9.7 03/14/2017   PROT 7.0 03/14/2017   ALBUMIN 4.2 03/14/2017   AST 29 03/14/2017   ALT 28 03/14/2017   ALKPHOS 138 (H) 03/14/2017   BILITOT 0.7 03/14/2017   GFRNONAA >60 03/14/2017   GFRAA >60 03/14/2017    Lab Results  Component Value Date   WBC 35.4 (H) 03/14/2017   NEUTROABS 32.5 (H) 03/14/2017   HGB 12.0 03/14/2017   HCT 35.8 03/14/2017   MCV 89.1 03/14/2017   PLT 243 03/14/2017   Lab Results  Component Value Date   CA125 74.8 (H) 01/15/2017     STUDIES: No results found.  ASSESSMENT: Peritoneal carcinomatosis  PLAN:    1. Peritoneal carcinomatosis: Given patient's stage of disease, she will  benefit from adjuvant chemotherapy using carboplatinum and Taxol. Patient has declined port placement at this time, but will reconsider in the future.  Patient tolerated cycle 1 of carboplatinum AUC 5 and dose reduced Taxol 135 mg/m with Neulasta support last week. If patient cannot tolerate this regimen, can consider single agent carboplatinum. Plan on doing 4 cycles and then reimage. If patient switches to single agent carboplatinum will plan on 6 cycles. Return to clinic in 2 weeks for consideration of cycle 2.  Patient has been instructed to keep her follow-up with gynecology oncology as scheduled. 2. Leukocytosis: Secondary to  Neulasta.  Approximately 20 minutes was spent in discussion, which greater than 50% was consultation.  Patient expressed understanding and was in agreement with this plan. She also understands that She can call clinic at any time with any questions, concerns, or complaints.   Cancer Staging Peritoneal carcinomatosis [facility]) Staging form: Soft Tissue Sarcoma of the Abdomen and Thoracic Visceral Organs, AJCC 8th Edition - Clinical stage from 02/13/2017: cT4c, cN1, cM0 - Signed by Lloyd Huger, MD on 02/13/2017   Lloyd Huger, MD   03/16/2017 5:34 PM

## 2017-03-14 ENCOUNTER — Inpatient Hospital Stay (HOSPITAL_BASED_OUTPATIENT_CLINIC_OR_DEPARTMENT_OTHER): Payer: Medicare Other | Admitting: Oncology

## 2017-03-14 ENCOUNTER — Inpatient Hospital Stay: Payer: Medicare Other

## 2017-03-14 VITALS — BP 138/75 | HR 87 | Temp 98.5°F | Resp 18 | Wt 118.5 lb

## 2017-03-14 DIAGNOSIS — J45909 Unspecified asthma, uncomplicated: Secondary | ICD-10-CM

## 2017-03-14 DIAGNOSIS — C801 Malignant (primary) neoplasm, unspecified: Principal | ICD-10-CM

## 2017-03-14 DIAGNOSIS — I10 Essential (primary) hypertension: Secondary | ICD-10-CM

## 2017-03-14 DIAGNOSIS — Z5111 Encounter for antineoplastic chemotherapy: Secondary | ICD-10-CM | POA: Diagnosis not present

## 2017-03-14 DIAGNOSIS — C786 Secondary malignant neoplasm of retroperitoneum and peritoneum: Secondary | ICD-10-CM

## 2017-03-14 DIAGNOSIS — Z87891 Personal history of nicotine dependence: Secondary | ICD-10-CM

## 2017-03-14 DIAGNOSIS — M810 Age-related osteoporosis without current pathological fracture: Secondary | ICD-10-CM

## 2017-03-14 DIAGNOSIS — D72828 Other elevated white blood cell count: Secondary | ICD-10-CM | POA: Diagnosis not present

## 2017-03-14 DIAGNOSIS — Z79899 Other long term (current) drug therapy: Secondary | ICD-10-CM

## 2017-03-14 DIAGNOSIS — Z7982 Long term (current) use of aspirin: Secondary | ICD-10-CM

## 2017-03-14 LAB — CBC WITH DIFFERENTIAL/PLATELET
BASOS ABS: 0 10*3/uL (ref 0–0.1)
Basophils Relative: 0 %
EOS ABS: 0.4 10*3/uL (ref 0–0.7)
EOS PCT: 1 %
HCT: 35.8 % (ref 35.0–47.0)
HEMOGLOBIN: 12 g/dL (ref 12.0–16.0)
LYMPHS ABS: 1.7 10*3/uL (ref 1.0–3.6)
Lymphocytes Relative: 5 %
MCH: 29.9 pg (ref 26.0–34.0)
MCHC: 33.5 g/dL (ref 32.0–36.0)
MCV: 89.1 fL (ref 80.0–100.0)
Monocytes Absolute: 0.8 10*3/uL (ref 0.2–0.9)
Monocytes Relative: 2 %
NEUTROS PCT: 92 %
Neutro Abs: 32.5 10*3/uL — ABNORMAL HIGH (ref 1.4–6.5)
PLATELETS: 243 10*3/uL (ref 150–440)
RBC: 4.01 MIL/uL (ref 3.80–5.20)
RDW: 14.7 % — AB (ref 11.5–14.5)
WBC: 35.4 10*3/uL — AB (ref 3.6–11.0)

## 2017-03-14 LAB — COMPREHENSIVE METABOLIC PANEL
ALBUMIN: 4.2 g/dL (ref 3.5–5.0)
ALT: 28 U/L (ref 14–54)
ANION GAP: 8 (ref 5–15)
AST: 29 U/L (ref 15–41)
Alkaline Phosphatase: 138 U/L — ABNORMAL HIGH (ref 38–126)
BUN: 14 mg/dL (ref 6–20)
CHLORIDE: 104 mmol/L (ref 101–111)
CO2: 27 mmol/L (ref 22–32)
Calcium: 9.7 mg/dL (ref 8.9–10.3)
Creatinine, Ser: 0.65 mg/dL (ref 0.44–1.00)
GFR calc Af Amer: >60 mL/min (ref >60)
GFR calc non Af Amer: >60 mL/min (ref >60)
GLUCOSE: 87 mg/dL (ref 65–99)
POTASSIUM: 3.6 mmol/L (ref 3.5–5.1)
SODIUM: 139 mmol/L (ref 135–145)
Total Bilirubin: 0.7 mg/dL (ref 0.3–1.2)
Total Protein: 7 g/dL (ref 6.5–8.1)

## 2017-03-14 NOTE — Progress Notes (Signed)
Complains of mild constipation, has taken miralax with relief. Has questions regarding how to take miralax. Tolerated first chemo well without side effects.

## 2017-03-14 NOTE — Progress Notes (Signed)
Nutrition Follow-up:  Patient report appetite is improving.  Husband reports she is still not eating much.  Reports that she is drinking 1 ensure per day.    Reports still having issues with constipation.  Taking miralax  Reports no side effects from chemotherapy.     Medications: reviewed, Lucrezia Starch has been stopped  Labs: reviewed  Anthropometrics:   Noted patient weight on 4/18 of 117 pounds, stable as on 3/29.   NUTRITION DIAGNOSIS: Malnutrition severe improving    MALNUTRITION DIAGNOSIS: Severe malnutrition stable   INTERVENTION:   Patient with questions regarding taking fiber supplement and adjusting miralax. Encouraged patient to ask those questions of MD. Encouraged small frequent meals. Encouraged increasing oral nutrition supplement of 350 calories or more to BID for added calories and protein. Samples and coupons given Discussed ways to manage constipation with fluid and foods.      MONITORING, EVALUATION, GOAL: Patient will consume adequate calories and protein to prevent further weight loss   NEXT VISIT: as needed  Rahmel Nedved B. Zenia Resides, [place], [place] Registered Dietitian [phone] (pager)

## 2017-03-19 ENCOUNTER — Telehealth: Payer: Self-pay | Admitting: Family Medicine

## 2017-03-19 NOTE — Telephone Encounter (Signed)
Returned call to pt. Informed pt that she has had both pneumonia vaccines.

## 2017-03-19 NOTE — Telephone Encounter (Signed)
Pt called left message on 03-18-17 for a return call, pt wanting to know when she had her pneumonia shot.  Thanks CC

## 2017-03-26 NOTE — Progress Notes (Signed)
[place]  Telephone:(336) [phone] Fax:(336) [phone]  ID: Lisa Hayden OB: 03-29-1930  MR#: [id number]  LKG#:[id number]  Patient Care Team: Birdie Sons, MD as PCP - General (Family Medicine) Clent Jacks, RN as Registered Nurse  CHIEF COMPLAINT: Peritoneal carcinomatosis.  INTERVAL HISTORY: Patient returns to clinic today for further evaluation and consideration of cycle 2 of carboplatinum and Taxol. She currently feels well and is asymptomatic. She does not complain of weakness or fatigue today. She has no neurologic complaints. She denies any recent fevers. She has a good appetite and has maintained her weight. She has no chest pain or shortness of breath. She denies any nausea, vomiting, constipation, or diarrhea. She has no abdominal pain. She has no urinary complaints. Patient offers no specific complaints today.  REVIEW OF SYSTEMS:   Review of Systems  Constitutional: Negative for fever, malaise/fatigue and weight loss.  Respiratory: Negative.  Negative for cough and shortness of breath.   Cardiovascular: Negative.  Negative for chest pain and leg swelling.  Gastrointestinal: Negative.  Negative for abdominal pain.  Genitourinary: Negative.   Musculoskeletal: Negative.   Skin: Negative.  Negative for rash.  Neurological: Negative for sensory change and weakness.  Psychiatric/Behavioral: Negative.  The patient is not nervous/anxious.     As per HPI. Otherwise, a complete review of systems is negative.  PAST MEDICAL HISTORY: Past Medical History:  Diagnosis Date  . Asthma   . Cancer ([place])   . Hypertension   . Osteoporosis     PAST SURGICAL HISTORY: Past Surgical History:  Procedure Laterality Date  . 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 APPENDECTOMY  1953  . BREAST LUMPECTOMY Left 10/29/94   negative nodes. Treated with radiation only  . BREAST SURGERY    . BUNIONECTOMY  1979  .  CATARACT EXTRACTION, BILATERAL     left 07/29/2012; right 06/24/2012  . COLON SURGERY  05/1991   secondary to cancer   . ROTATOR CUFF REPAIR Right 01/25/10  . TONSILLECTOMY  1938   and adenoidectomy    FAMILY HISTORY: Family History  Problem Relation Age of Onset  . CVA Mother   . Congestive Heart Failure Mother   . Diabetes Sister   . Asthma Father     ADVANCED DIRECTIVES (Y/N):  N  HEALTH MAINTENANCE: Social History  Substance Use Topics  . Smoking status: Former Research scientist (life sciences)  . Smokeless tobacco: Never Used  . Alcohol use 4.2 oz/week    7 Standard drinks or equivalent per week     Comment: glass or beer at dinner     Colonoscopy:  PAP:  Bone density:  Lipid panel:  Allergies  Allergen Reactions  . Actonel [Risedronate Sodium] Other (See Comments)    headache  . Cortisone     very alert and did not sleep for 24 hrs  . Daypro  [Oxaprozin]   . Meloxicam     Current Outpatient Prescriptions  Medication Sig Dispense Refill  . alendronate (FOSAMAX) 70 MG tablet Take 1 tablet (70 mg total) by mouth once a week. On Sunday 12 tablet 4  . aspirin 81 MG chewable tablet Chew 81 mg by mouth daily.    . cholecalciferol (VITAMIN D) 1000 UNITS tablet Take 2,000 Units by mouth every evening. With dinner     . ondansetron (ZOFRAN) 8 MG tablet Take 1 tablet (8 mg total) by mouth every 8 (eight) hours as needed for nausea or vomiting.  60 tablet 0  . prochlorperazine (COMPAZINE) 10 MG tablet Take 1 tablet (10 mg total) by mouth every 6 (six) hours as needed for nausea or vomiting. 60 tablet 0  . potassium chloride SA (K-DUR,KLOR-CON) 20 MEQ tablet Take by mouth.     No current facility-administered medications for this visit.     OBJECTIVE: Vitals:   03/27/17 0905  BP: 136/86  Pulse: 99  Resp: 20  Temp: 98.6 F (37 C)     Body mass index is 21.55 kg/m.    ECOG FS:0 - Asymptomatic  General: Well-developed, well-nourished, no acute distress. Eyes: Pink conjunctiva, anicteric  sclera. Lungs: Clear to auscultation bilaterally. Heart: Regular rate and rhythm. No rubs, murmurs, or gallops. Abdomen: Soft, nontender, nondistended. No organomegaly noted, normoactive bowel sounds. Musculoskeletal: No edema, cyanosis, or clubbing. Neuro: Alert, answering all questions appropriately. Cranial nerves grossly intact. Skin: No rashes or petechiae noted. Psych: Normal affect.   LAB RESULTS:  Lab Results  Component Value Date   NA 137 03/27/2017   K 3.8 03/27/2017   CL 105 03/27/2017   CO2 26 03/27/2017   GLUCOSE 86 03/27/2017   BUN 16 03/27/2017   CREATININE 0.63 03/27/2017   CALCIUM 9.4 03/27/2017   PROT 7.3 03/27/2017   ALBUMIN 3.9 03/27/2017   AST 22 03/27/2017   ALT 23 03/27/2017   ALKPHOS 87 03/27/2017   BILITOT 1.1 03/27/2017   GFRNONAA >60 03/27/2017   GFRAA >60 03/27/2017    Lab Results  Component Value Date   WBC 4.4 03/27/2017   NEUTROABS 3.1 03/27/2017   HGB 12.2 03/27/2017   HCT 35.2 03/27/2017   MCV 89.5 03/27/2017   PLT 389 03/27/2017   Lab Results  Component Value Date   CA125 74.8 (H) 01/15/2017     STUDIES: No results found.  ASSESSMENT: Peritoneal carcinomatosis  PLAN:    1. Peritoneal carcinomatosis: Given patient's stage of disease, she will  benefit from adjuvant chemotherapy using carboplatinum and Taxol. Patient has declined port placement at this time, but will reconsider in the future.  Proceed with cycle 2 of carboplatinum AUC 5 and dose reduced Taxol 135 mg/m with Neulasta support last week. If patient cannot tolerate this regimen, can consider single agent carboplatinum. Plan on doing 4 cycles and then reimage. If patient switches to single agent carboplatinum will plan on 6 cycles. Return to clinic in 3 weeks for consideration of cycle 3.  Patient has been instructed to keep her follow-up with gynecology oncology as scheduled. 2. Leukocytosis: Resolved. Secondary to Neulasta.  Approximately 30 minutes was spent in  discussion, which greater than 50% was consultation.  Patient expressed understanding and was in agreement with this plan. She also understands that She can call clinic at any time with any questions, concerns, or complaints.   Cancer Staging Peritoneal carcinomatosis [facility]) Staging form: Soft Tissue Sarcoma of the Abdomen and Thoracic Visceral Organs, AJCC 8th Edition - Clinical stage from 02/13/2017: cT4c, cN1, cM0 - Signed by Lloyd Huger, MD on 02/13/2017   Lloyd Huger, MD   03/29/2017 8:46 AM

## 2017-03-27 ENCOUNTER — Inpatient Hospital Stay: Payer: Medicare Other | Attending: Oncology

## 2017-03-27 ENCOUNTER — Inpatient Hospital Stay: Payer: Medicare Other

## 2017-03-27 ENCOUNTER — Inpatient Hospital Stay (HOSPITAL_BASED_OUTPATIENT_CLINIC_OR_DEPARTMENT_OTHER): Payer: Medicare Other | Admitting: Oncology

## 2017-03-27 VITALS — BP 136/86 | HR 99 | Temp 98.6°F | Resp 20 | Wt 117.8 lb

## 2017-03-27 DIAGNOSIS — Z7689 Persons encountering health services in other specified circumstances: Secondary | ICD-10-CM | POA: Diagnosis not present

## 2017-03-27 DIAGNOSIS — C786 Secondary malignant neoplasm of retroperitoneum and peritoneum: Secondary | ICD-10-CM

## 2017-03-27 DIAGNOSIS — I10 Essential (primary) hypertension: Secondary | ICD-10-CM | POA: Insufficient documentation

## 2017-03-27 DIAGNOSIS — C801 Malignant (primary) neoplasm, unspecified: Secondary | ICD-10-CM | POA: Insufficient documentation

## 2017-03-27 DIAGNOSIS — Z87891 Personal history of nicotine dependence: Secondary | ICD-10-CM | POA: Insufficient documentation

## 2017-03-27 DIAGNOSIS — E876 Hypokalemia: Secondary | ICD-10-CM | POA: Insufficient documentation

## 2017-03-27 DIAGNOSIS — Z7982 Long term (current) use of aspirin: Secondary | ICD-10-CM | POA: Insufficient documentation

## 2017-03-27 DIAGNOSIS — Z79899 Other long term (current) drug therapy: Secondary | ICD-10-CM

## 2017-03-27 DIAGNOSIS — M810 Age-related osteoporosis without current pathological fracture: Secondary | ICD-10-CM | POA: Insufficient documentation

## 2017-03-27 DIAGNOSIS — Z90710 Acquired absence of both cervix and uterus: Secondary | ICD-10-CM | POA: Diagnosis not present

## 2017-03-27 DIAGNOSIS — Z5111 Encounter for antineoplastic chemotherapy: Secondary | ICD-10-CM | POA: Insufficient documentation

## 2017-03-27 LAB — COMPREHENSIVE METABOLIC PANEL
ALBUMIN: 3.9 g/dL (ref 3.5–5.0)
ALK PHOS: 87 U/L (ref 38–126)
ALT: 23 U/L (ref 14–54)
AST: 22 U/L (ref 15–41)
Anion gap: 6 (ref 5–15)
BILIRUBIN TOTAL: 1.1 mg/dL (ref 0.3–1.2)
BUN: 16 mg/dL (ref 6–20)
CO2: 26 mmol/L (ref 22–32)
CREATININE: 0.63 mg/dL (ref 0.44–1.00)
Calcium: 9.4 mg/dL (ref 8.9–10.3)
Chloride: 105 mmol/L (ref 101–111)
GFR calc Af Amer: >60 mL/min (ref >60)
GLUCOSE: 86 mg/dL (ref 65–99)
POTASSIUM: 3.8 mmol/L (ref 3.5–5.1)
Sodium: 137 mmol/L (ref 135–145)
TOTAL PROTEIN: 7.3 g/dL (ref 6.5–8.1)

## 2017-03-27 LAB — CBC WITH DIFFERENTIAL/PLATELET
BASOS ABS: 0.1 10*3/uL (ref 0–0.1)
BASOS PCT: 1 %
Eosinophils Absolute: 0 10*3/uL (ref 0–0.7)
Eosinophils Relative: 1 %
HEMATOCRIT: 35.2 % (ref 35.0–47.0)
HEMOGLOBIN: 12.2 g/dL (ref 12.0–16.0)
LYMPHS PCT: 16 %
Lymphs Abs: 0.7 10*3/uL — ABNORMAL LOW (ref 1.0–3.6)
MCH: 31.1 pg (ref 26.0–34.0)
MCHC: 34.8 g/dL (ref 32.0–36.0)
MCV: 89.5 fL (ref 80.0–100.0)
MONO ABS: 0.5 10*3/uL (ref 0.2–0.9)
Monocytes Relative: 10 %
NEUTROS ABS: 3.1 10*3/uL (ref 1.4–6.5)
NEUTROS PCT: 72 %
Platelets: 389 10*3/uL (ref 150–440)
RBC: 3.93 MIL/uL (ref 3.80–5.20)
RDW: 15.3 % — AB (ref 11.5–14.5)
WBC: 4.4 10*3/uL (ref 3.6–11.0)

## 2017-03-27 MED ORDER — DEXAMETHASONE SODIUM PHOSPHATE 10 MG/ML IJ SOLN
10.0000 mg | Freq: Once | INTRAMUSCULAR | Status: AC
  Administered 2017-03-27: 10 mg via INTRAVENOUS
  Filled 2017-03-27: qty 1

## 2017-03-27 MED ORDER — SODIUM CHLORIDE 0.9 % IV SOLN: 10.0000 mg | Freq: Once | INTRAVENOUS | Status: DC

## 2017-03-27 MED ORDER — FAMOTIDINE IN NACL 20-0.9 MG/50ML-% IV SOLN
20.0000 mg | Freq: Once | INTRAVENOUS | Status: AC
  Administered 2017-03-27: 20 mg via INTRAVENOUS
  Filled 2017-03-27: qty 50

## 2017-03-27 MED ORDER — DIPHENHYDRAMINE HCL 50 MG/ML IJ SOLN
25.0000 mg | Freq: Once | INTRAMUSCULAR | Status: AC
Start: 2017-03-27 — End: 2017-03-27
  Administered 2017-03-27: 25 mg via INTRAVENOUS
  Filled 2017-03-27: qty 1

## 2017-03-27 MED ORDER — SODIUM CHLORIDE 0.9 % IV SOLN
300.0000 mg | Freq: Once | INTRAVENOUS | Status: AC
  Administered 2017-03-27: 300 mg via INTRAVENOUS
  Filled 2017-03-27: qty 30

## 2017-03-27 MED ORDER — PACLITAXEL CHEMO INJECTION 300 MG/50ML
135.0000 mg/m2 | Freq: Once | INTRAVENOUS | Status: AC
  Administered 2017-03-27: 204 mg via INTRAVENOUS
  Filled 2017-03-27: qty 34

## 2017-03-27 MED ORDER — PEGFILGRASTIM 6 MG/0.6ML ~~LOC~~ PSKT
6.0000 mg | PREFILLED_SYRINGE | Freq: Once | SUBCUTANEOUS | Status: AC
  Administered 2017-03-27: 6 mg via SUBCUTANEOUS

## 2017-03-27 MED ORDER — PALONOSETRON HCL INJECTION 0.25 MG/5ML
0.2500 mg | Freq: Once | INTRAVENOUS | Status: AC
  Administered 2017-03-27: 0.25 mg via INTRAVENOUS
  Filled 2017-03-27: qty 5

## 2017-03-27 MED ORDER — SODIUM CHLORIDE 0.9 % IV SOLN
Freq: Once | INTRAVENOUS | Status: AC
  Administered 2017-03-27: 10:00:00 via INTRAVENOUS
  Filled 2017-03-27: qty 1000

## 2017-03-27 MED ORDER — HEPARIN SOD (PORK) LOCK FLUSH 100 UNIT/ML IV SOLN
500.0000 [IU] | Freq: Once | INTRAVENOUS | Status: DC | PRN
  Filled 2017-03-27: qty 5

## 2017-04-16 NOTE — Progress Notes (Signed)
[place]  Telephone:(336) [phone] Fax:(336) [phone]  ID: Lisa Hayden OB: 08-06-1930  MR#: [id number]  WIO#:[id number]  Patient Care Team: Birdie Sons, MD as PCP - General (Family Medicine) Clent Jacks, RN as Registered Nurse  CHIEF COMPLAINT: Peritoneal carcinomatosis.  INTERVAL HISTORY: Patient returns to clinic today for further evaluation and consideration of cycle 3 of carboplatinum and Taxol. She currently feels well and is asymptomatic. She does not complain of weakness or fatigue today. She has no neurologic complaints. She denies any recent fevers. She has a good appetite and has maintained her weight. She has no chest pain or shortness of breath. She denies any nausea, vomiting, constipation, or diarrhea. She has no abdominal pain. She has no urinary complaints. Patient offers no specific complaints today.  REVIEW OF SYSTEMS:   Review of Systems  Constitutional: Negative for fever, malaise/fatigue and weight loss.  Respiratory: Negative.  Negative for cough and shortness of breath.   Cardiovascular: Negative.  Negative for chest pain and leg swelling.  Gastrointestinal: Negative.  Negative for abdominal pain.  Genitourinary: Negative.   Musculoskeletal: Negative.   Skin: Negative.  Negative for rash.  Neurological: Negative for sensory change and weakness.  Psychiatric/Behavioral: Negative.  The patient is not nervous/anxious.     As per HPI. Otherwise, a complete review of systems is negative.  PAST MEDICAL HISTORY: Past Medical History:  Diagnosis Date  . Asthma   . Cancer ([place])   . Hypertension   . Osteoporosis     PAST SURGICAL HISTORY: Past Surgical History:  Procedure Laterality Date  . 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 APPENDECTOMY  1953  . BREAST LUMPECTOMY Left 10/29/94   negative nodes. Treated with radiation only  . BREAST SURGERY    . BUNIONECTOMY  1979  .  CATARACT EXTRACTION, BILATERAL     left 07/29/2012; right 06/24/2012  . COLON SURGERY  05/1991   secondary to cancer   . ROTATOR CUFF REPAIR Right 01/25/10  . TONSILLECTOMY  1938   and adenoidectomy    FAMILY HISTORY: Family History  Problem Relation Age of Onset  . CVA Mother   . Congestive Heart Failure Mother   . Diabetes Sister   . Asthma Father     ADVANCED DIRECTIVES (Y/N):  N  HEALTH MAINTENANCE: Social History  Substance Use Topics  . Smoking status: Former Research scientist (life sciences)  . Smokeless tobacco: Never Used  . Alcohol use 4.2 oz/week    7 Standard drinks or equivalent per week     Comment: glass or beer at dinner     Colonoscopy:  PAP:  Bone density:  Lipid panel:  Allergies  Allergen Reactions  . Actonel [Risedronate Sodium] Other (See Comments)    headache  . Cortisone     very alert and did not sleep for 24 hrs  . Daypro  [Oxaprozin]   . Meloxicam     Current Outpatient Prescriptions  Medication Sig Dispense Refill  . alendronate (FOSAMAX) 70 MG tablet Take 1 tablet (70 mg total) by mouth once a week. On Sunday (Patient not taking: Reported on 04/17/2017) 12 tablet 4  . aspirin 81 MG chewable tablet Chew 81 mg by mouth daily.    . cholecalciferol (VITAMIN D) 1000 UNITS tablet Take 2,000 Units by mouth every evening. With dinner     . ondansetron (ZOFRAN) 8 MG tablet Take 1 tablet (8 mg total) by mouth every 8 (eight) hours  as needed for nausea or vomiting. (Patient not taking: Reported on 04/17/2017) 60 tablet 0  . potassium chloride SA (K-DUR,KLOR-CON) 20 MEQ tablet Take by mouth.    . prochlorperazine (COMPAZINE) 10 MG tablet Take 1 tablet (10 mg total) by mouth every 6 (six) hours as needed for nausea or vomiting. (Patient not taking: Reported on 04/17/2017) 60 tablet 0   No current facility-administered medications for this visit.     OBJECTIVE: Vitals:   04/17/17 0859  BP: (!) 143/83  Pulse: 86  Resp: 20  Temp: 97.7 F (36.5 C)     Body mass index is  21.75 kg/m.    ECOG FS:0 - Asymptomatic  General: Well-developed, well-nourished, no acute distress. Eyes: Pink conjunctiva, anicteric sclera. Lungs: Clear to auscultation bilaterally. Heart: Regular rate and rhythm. No rubs, murmurs, or gallops. Abdomen: Soft, nontender, nondistended. No organomegaly noted, normoactive bowel sounds. Musculoskeletal: No edema, cyanosis, or clubbing. Neuro: Alert, answering all questions appropriately. Cranial nerves grossly intact. Skin: No rashes or petechiae noted. Psych: Normal affect.   LAB RESULTS:  Lab Results  Component Value Date   NA 139 04/17/2017   K 3.4 (L) 04/17/2017   CL 106 04/17/2017   CO2 28 04/17/2017   GLUCOSE 80 04/17/2017   BUN 16 04/17/2017   CREATININE 0.60 04/17/2017   CALCIUM 9.4 04/17/2017   PROT 7.0 04/17/2017   ALBUMIN 4.2 04/17/2017   AST 20 04/17/2017   ALT 15 04/17/2017   ALKPHOS 64 04/17/2017   BILITOT 0.9 04/17/2017   GFRNONAA >60 04/17/2017   GFRAA >60 04/17/2017    Lab Results  Component Value Date   WBC 4.3 04/17/2017   NEUTROABS 3.1 04/17/2017   HGB 12.2 04/17/2017   HCT 35.3 04/17/2017   MCV 90.6 04/17/2017   PLT 252 04/17/2017   Lab Results  Component Value Date   CA125 74.8 (H) 01/15/2017     STUDIES: No results found.  ASSESSMENT: Peritoneal carcinomatosis  PLAN:    1. Peritoneal carcinomatosis: Given patient's stage of disease, she will  benefit from adjuvant chemotherapy using carboplatinum and Taxol. Patient has declined port placement at this time, but will reconsider in the future.  Proceed with cycle 3 of carboplatinum AUC 5 and dose reduced Taxol 135 mg/m with Neulasta support. Plan on doing 4 cycles and then reimage. Return to clinic in 3 weeks for consideration of cycle 4.  Patient has been instructed to keep her follow-up with gynecology oncology as scheduled. 2. Leukocytosis: Resolved. Secondary to Neulasta. 3. Hypokalemia: Mild, monitor.  Approximately 30 minutes was  spent in discussion, which greater than 50% was consultation.  Patient expressed understanding and was in agreement with this plan. She also understands that She can call clinic at any time with any questions, concerns, or complaints.   Cancer Staging Peritoneal carcinomatosis [facility]) Staging form: Soft Tissue Sarcoma of the Abdomen and Thoracic Visceral Organs, AJCC 8th Edition - Clinical stage from 02/13/2017: cT4c, cN1, cM0 - Signed by Lloyd Huger, MD on 02/13/2017   Lloyd Huger, MD   04/20/2017 1:29 PM

## 2017-04-17 ENCOUNTER — Inpatient Hospital Stay (HOSPITAL_BASED_OUTPATIENT_CLINIC_OR_DEPARTMENT_OTHER): Payer: Medicare Other | Admitting: Oncology

## 2017-04-17 ENCOUNTER — Inpatient Hospital Stay: Payer: Medicare Other

## 2017-04-17 VITALS — BP 143/83 | HR 86 | Temp 97.7°F | Resp 20 | Wt 118.9 lb

## 2017-04-17 DIAGNOSIS — C801 Malignant (primary) neoplasm, unspecified: Secondary | ICD-10-CM

## 2017-04-17 DIAGNOSIS — I10 Essential (primary) hypertension: Secondary | ICD-10-CM

## 2017-04-17 DIAGNOSIS — Z79899 Other long term (current) drug therapy: Secondary | ICD-10-CM

## 2017-04-17 DIAGNOSIS — E876 Hypokalemia: Secondary | ICD-10-CM

## 2017-04-17 DIAGNOSIS — Z7982 Long term (current) use of aspirin: Secondary | ICD-10-CM

## 2017-04-17 DIAGNOSIS — Z90710 Acquired absence of both cervix and uterus: Secondary | ICD-10-CM

## 2017-04-17 DIAGNOSIS — C786 Secondary malignant neoplasm of retroperitoneum and peritoneum: Secondary | ICD-10-CM

## 2017-04-17 DIAGNOSIS — M810 Age-related osteoporosis without current pathological fracture: Secondary | ICD-10-CM

## 2017-04-17 DIAGNOSIS — Z5111 Encounter for antineoplastic chemotherapy: Secondary | ICD-10-CM | POA: Diagnosis not present

## 2017-04-17 DIAGNOSIS — Z87891 Personal history of nicotine dependence: Secondary | ICD-10-CM

## 2017-04-17 LAB — CBC WITH DIFFERENTIAL/PLATELET
BASOS ABS: 0.1 10*3/uL (ref 0–0.1)
BASOS PCT: 1 %
EOS PCT: 1 %
Eosinophils Absolute: 0 10*3/uL (ref 0–0.7)
HEMATOCRIT: 35.3 % (ref 35.0–47.0)
Hemoglobin: 12.2 g/dL (ref 12.0–16.0)
LYMPHS PCT: 18 %
Lymphs Abs: 0.8 10*3/uL — ABNORMAL LOW (ref 1.0–3.6)
MCH: 31.2 pg (ref 26.0–34.0)
MCHC: 34.5 g/dL (ref 32.0–36.0)
MCV: 90.6 fL (ref 80.0–100.0)
Monocytes Absolute: 0.4 10*3/uL (ref 0.2–0.9)
Monocytes Relative: 9 %
NEUTROS ABS: 3.1 10*3/uL (ref 1.4–6.5)
Neutrophils Relative %: 71 %
PLATELETS: 252 10*3/uL (ref 150–440)
RBC: 3.9 MIL/uL (ref 3.80–5.20)
RDW: 16 % — ABNORMAL HIGH (ref 11.5–14.5)
WBC: 4.3 10*3/uL (ref 3.6–11.0)

## 2017-04-17 LAB — COMPREHENSIVE METABOLIC PANEL
ALBUMIN: 4.2 g/dL (ref 3.5–5.0)
ALT: 15 U/L (ref 14–54)
AST: 20 U/L (ref 15–41)
Alkaline Phosphatase: 64 U/L (ref 38–126)
Anion gap: 5 (ref 5–15)
BUN: 16 mg/dL (ref 6–20)
CHLORIDE: 106 mmol/L (ref 101–111)
CO2: 28 mmol/L (ref 22–32)
CREATININE: 0.6 mg/dL (ref 0.44–1.00)
Calcium: 9.4 mg/dL (ref 8.9–10.3)
GFR calc Af Amer: >60 mL/min (ref >60)
GLUCOSE: 80 mg/dL (ref 65–99)
POTASSIUM: 3.4 mmol/L — AB (ref 3.5–5.1)
Sodium: 139 mmol/L (ref 135–145)
Total Bilirubin: 0.9 mg/dL (ref 0.3–1.2)
Total Protein: 7 g/dL (ref 6.5–8.1)

## 2017-04-17 MED ORDER — PEGFILGRASTIM 6 MG/0.6ML ~~LOC~~ PSKT
6.0000 mg | PREFILLED_SYRINGE | Freq: Once | SUBCUTANEOUS | Status: AC
  Administered 2017-04-17: 6 mg via SUBCUTANEOUS

## 2017-04-17 MED ORDER — SODIUM CHLORIDE 0.9 % IV SOLN
Freq: Once | INTRAVENOUS | Status: AC
  Administered 2017-04-17: 10:00:00 via INTRAVENOUS
  Filled 2017-04-17: qty 1000

## 2017-04-17 MED ORDER — PALONOSETRON HCL INJECTION 0.25 MG/5ML
0.2500 mg | Freq: Once | INTRAVENOUS | Status: AC
  Administered 2017-04-17: 0.25 mg via INTRAVENOUS
  Filled 2017-04-17: qty 5

## 2017-04-17 MED ORDER — SODIUM CHLORIDE 0.9 % IV SOLN: 10.0000 mg | Freq: Once | INTRAVENOUS | Status: DC

## 2017-04-17 MED ORDER — DEXAMETHASONE SODIUM PHOSPHATE 10 MG/ML IJ SOLN
10.0000 mg | Freq: Once | INTRAMUSCULAR | Status: AC
  Administered 2017-04-17: 10 mg via INTRAVENOUS
  Filled 2017-04-17: qty 1

## 2017-04-17 MED ORDER — FAMOTIDINE IN NACL 20-0.9 MG/50ML-% IV SOLN
20.0000 mg | Freq: Once | INTRAVENOUS | Status: AC
  Administered 2017-04-17: 20 mg via INTRAVENOUS
  Filled 2017-04-17: qty 50

## 2017-04-17 MED ORDER — SODIUM CHLORIDE 0.9 % IV SOLN
300.0000 mg | Freq: Once | INTRAVENOUS | Status: AC
  Administered 2017-04-17: 300 mg via INTRAVENOUS
  Filled 2017-04-17: qty 30

## 2017-04-17 MED ORDER — PACLITAXEL CHEMO INJECTION 300 MG/50ML
135.0000 mg/m2 | Freq: Once | INTRAVENOUS | Status: AC
  Administered 2017-04-17: 204 mg via INTRAVENOUS
  Filled 2017-04-17: qty 34

## 2017-04-17 MED ORDER — DIPHENHYDRAMINE HCL 50 MG/ML IJ SOLN
25.0000 mg | Freq: Once | INTRAMUSCULAR | Status: AC
  Administered 2017-04-17: 25 mg via INTRAVENOUS
  Filled 2017-04-17: qty 1

## 2017-04-17 NOTE — Progress Notes (Signed)
Patient denies any concerns today.  

## 2017-05-06 ENCOUNTER — Telehealth: Payer: Self-pay | Admitting: *Deleted

## 2017-05-06 NOTE — Progress Notes (Signed)
[place]  [phone] [phone]  ID: Lisa Hayden OB: 22-May-1930  MR#: [id number]  JSE#:[id number]  Patient Care Team: Birdie Sons, MD as PCP - General (Family Medicine) Clent Jacks, RN as Registered Nurse  CHIEF COMPLAINT: Peritoneal carcinomatosis.  INTERVAL HISTORY: Patient returns to clinic today for further evaluation and consideration of cycle 4 of carboplatinum and Taxol. She currently feels well and is asymptomatic. She has a boil on her leg, but states it has improved. She does not complain of weakness or fatigue today. She has no neurologic complaints. She denies any recent fevers. She has a good appetite and has maintained her weight. She has no chest pain or shortness of breath. She denies any nausea, vomiting, constipation, or diarrhea. She has no abdominal pain. She has no urinary complaints. Patient offers no further specific complaints today.  REVIEW OF SYSTEMS:   Review of Systems  Constitutional: Negative for fever, malaise/fatigue and weight loss.  Respiratory: Negative.  Negative for cough and shortness of breath.   Cardiovascular: Negative.  Negative for chest pain and leg swelling.  Gastrointestinal: Negative.  Negative for abdominal pain.  Genitourinary: Negative.   Musculoskeletal: Negative.   Skin: Negative.  Negative for rash.  Neurological: Negative for sensory change and weakness.  Psychiatric/Behavioral: Negative.  The patient is not nervous/anxious.     As per HPI. Otherwise, a complete review of systems is negative.  PAST MEDICAL HISTORY: Past Medical History:  Diagnosis Date  . Asthma   . Cancer ([place])   . Hypertension   . Osteoporosis     PAST SURGICAL HISTORY: Past Surgical History:  Procedure Laterality Date  . 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 APPENDECTOMY  1953  . BREAST LUMPECTOMY Left 10/29/94   negative nodes. Treated with  radiation only  . BREAST SURGERY    . BUNIONECTOMY  1979  . CATARACT EXTRACTION, BILATERAL     left 07/29/2012; right 06/24/2012  . COLON SURGERY  05/1991   secondary to cancer   . ROTATOR CUFF REPAIR Right 01/25/10  . TONSILLECTOMY  1938   and adenoidectomy    FAMILY HISTORY: Family History  Problem Relation Age of Onset  . CVA Mother   . Congestive Heart Failure Mother   . Diabetes Sister   . Asthma Father     ADVANCED DIRECTIVES (Y/N):  N  HEALTH MAINTENANCE: Social History  Substance Use Topics  . Smoking status: Former Research scientist (life sciences)  . Smokeless tobacco: Never Used  . Alcohol use 4.2 oz/week    7 Standard drinks or equivalent per week     Comment: glass or beer at dinner     Colonoscopy:  PAP:  Bone density:  Lipid panel:  Allergies  Allergen Reactions  . Actonel [Risedronate Sodium] Other (See Comments)    headache  . Cortisone     very alert and did not sleep for 24 hrs  . Daypro  [Oxaprozin]   . Meloxicam     Current Outpatient Prescriptions  Medication Sig Dispense Refill  . alendronate (FOSAMAX) 70 MG tablet Take 1 tablet (70 mg total) by mouth once a week. On Sunday 12 tablet 4  . aspirin 81 MG chewable tablet Chew 81 mg by mouth daily.    . cholecalciferol (VITAMIN D) 1000 UNITS tablet Take 2,000 Units by mouth every evening. With dinner     . ondansetron (ZOFRAN) 8 MG tablet Take 1 tablet (8 mg  total) by mouth every 8 (eight) hours as needed for nausea or vomiting. 60 tablet 0  . potassium chloride SA (K-DUR,KLOR-CON) 20 MEQ tablet Take by mouth.    . prochlorperazine (COMPAZINE) 10 MG tablet Take 1 tablet (10 mg total) by mouth every 6 (six) hours as needed for nausea or vomiting. 60 tablet 0   No current facility-administered medications for this visit.     OBJECTIVE: Vitals:   05/08/17 0849  BP: 136/84  Pulse: 86  Resp: 20  Temp: (!) 96.8 F (36 C)     Body mass index is 21.71 kg/m.    ECOG FS:0 - Asymptomatic  General: Well-developed,  well-nourished, no acute distress. Eyes: Pink conjunctiva, anicteric sclera. Lungs: Clear to auscultation bilaterally. Heart: Regular rate and rhythm. No rubs, murmurs, or gallops. Abdomen: Soft, nontender, nondistended. No organomegaly noted, normoactive bowel sounds. Musculoskeletal: No edema, cyanosis, or clubbing. Neuro: Alert, answering all questions appropriately. Cranial nerves grossly intact. Skin: No rashes or petechiae noted. Psych: Normal affect.   LAB RESULTS:  Lab Results  Component Value Date   NA 140 05/08/2017   K 4.0 05/08/2017   CL 106 05/08/2017   CO2 27 05/08/2017   GLUCOSE 140 (H) 05/08/2017   BUN 15 05/08/2017   CREATININE 0.66 05/08/2017   CALCIUM 9.5 05/08/2017   PROT 6.8 05/08/2017   ALBUMIN 4.0 05/08/2017   AST 21 05/08/2017   ALT 14 05/08/2017   ALKPHOS 65 05/08/2017   BILITOT 0.8 05/08/2017   GFRNONAA >60 05/08/2017   GFRAA >60 05/08/2017    Lab Results  Component Value Date   WBC 3.5 (L) 05/08/2017   NEUTROABS 2.1 05/08/2017   HGB 12.7 05/08/2017   HCT 36.9 05/08/2017   MCV 91.1 05/08/2017   PLT 275 05/08/2017   Lab Results  Component Value Date   CA125 22.3 05/08/2017     STUDIES: No results found.  ASSESSMENT: Peritoneal carcinomatosis  PLAN:    1. Peritoneal carcinomatosis: Given patient's stage of disease, she will  benefit from adjuvant chemotherapy using carboplatinum and Taxol. Patient has declined port placement at this time, but will reconsider in the future.  Proceed with cycle 4 of carboplatinum AUC 5 and dose reduced Taxol 135 mg/m with Neulasta support. Return to clinic in 6 weeks for repeat imaging with CT scan and follow-up for further evaluation. We will also consider genetic testing at that time.  Patient has been instructed to keep her follow-up with gynecology oncology as scheduled. 2. Leukopenia: Mild, monitor. 3. Hypokalemia: Resolved. 4. Boil: Patient declined exam, therefore was told to call clinic if it  appears worse in the next several days.  Approximately 30 minutes was spent in discussion, which greater than 50% was consultation.  Patient expressed understanding and was in agreement with this plan. She also understands that She can call clinic at any time with any questions, concerns, or complaints.   Cancer Staging Peritoneal carcinomatosis [facility]) Staging form: Soft Tissue Sarcoma of the Abdomen and Thoracic Visceral Organs, AJCC 8th Edition - Clinical stage from 02/13/2017: cT4c, cN1, cM0 - Signed by Lloyd Huger, MD on 02/13/2017   Lloyd Huger, MD   05/10/2017 2:00 PM

## 2017-05-06 NOTE — Telephone Encounter (Signed)
Patient called and wanted to let Dr Grayland Ormond know that she developed a boil in her groin after her last chemo treatment. She does not want to have it lanced, States you can look at it and decide what if anything to do about it when she comes in for her next tx on Wednesday

## 2017-05-06 NOTE — Telephone Encounter (Signed)
Per Dr Grayland Ormond, he will check it Wednesday when she comes in

## 2017-05-08 ENCOUNTER — Inpatient Hospital Stay: Payer: Medicare Other

## 2017-05-08 ENCOUNTER — Inpatient Hospital Stay: Payer: Medicare Other | Attending: Oncology

## 2017-05-08 ENCOUNTER — Inpatient Hospital Stay (HOSPITAL_BASED_OUTPATIENT_CLINIC_OR_DEPARTMENT_OTHER): Payer: Medicare Other | Admitting: Oncology

## 2017-05-08 VITALS — BP 136/84 | HR 86 | Temp 96.8°F | Resp 20 | Wt 118.7 lb

## 2017-05-08 DIAGNOSIS — D72819 Decreased white blood cell count, unspecified: Secondary | ICD-10-CM

## 2017-05-08 DIAGNOSIS — Z87891 Personal history of nicotine dependence: Secondary | ICD-10-CM | POA: Diagnosis not present

## 2017-05-08 DIAGNOSIS — C786 Secondary malignant neoplasm of retroperitoneum and peritoneum: Secondary | ICD-10-CM

## 2017-05-08 DIAGNOSIS — C801 Malignant (primary) neoplasm, unspecified: Secondary | ICD-10-CM | POA: Diagnosis not present

## 2017-05-08 DIAGNOSIS — Z5111 Encounter for antineoplastic chemotherapy: Secondary | ICD-10-CM | POA: Insufficient documentation

## 2017-05-08 DIAGNOSIS — L02429 Furuncle of limb, unspecified: Secondary | ICD-10-CM | POA: Diagnosis not present

## 2017-05-08 DIAGNOSIS — Z79899 Other long term (current) drug therapy: Secondary | ICD-10-CM

## 2017-05-08 DIAGNOSIS — J45909 Unspecified asthma, uncomplicated: Secondary | ICD-10-CM | POA: Diagnosis not present

## 2017-05-08 DIAGNOSIS — Z7982 Long term (current) use of aspirin: Secondary | ICD-10-CM | POA: Insufficient documentation

## 2017-05-08 DIAGNOSIS — M818 Other osteoporosis without current pathological fracture: Secondary | ICD-10-CM | POA: Insufficient documentation

## 2017-05-08 DIAGNOSIS — I10 Essential (primary) hypertension: Secondary | ICD-10-CM | POA: Insufficient documentation

## 2017-05-08 LAB — COMPREHENSIVE METABOLIC PANEL
ALBUMIN: 4 g/dL (ref 3.5–5.0)
ALK PHOS: 65 U/L (ref 38–126)
ALT: 14 U/L (ref 14–54)
AST: 21 U/L (ref 15–41)
Anion gap: 7 (ref 5–15)
BUN: 15 mg/dL (ref 6–20)
CALCIUM: 9.5 mg/dL (ref 8.9–10.3)
CO2: 27 mmol/L (ref 22–32)
CREATININE: 0.66 mg/dL (ref 0.44–1.00)
Chloride: 106 mmol/L (ref 101–111)
GFR calc Af Amer: >60 mL/min (ref >60)
GFR calc non Af Amer: >60 mL/min (ref >60)
GLUCOSE: 140 mg/dL — AB (ref 65–99)
Potassium: 4 mmol/L (ref 3.5–5.1)
Sodium: 140 mmol/L (ref 135–145)
TOTAL PROTEIN: 6.8 g/dL (ref 6.5–8.1)
Total Bilirubin: 0.8 mg/dL (ref 0.3–1.2)

## 2017-05-08 LAB — CBC WITH DIFFERENTIAL/PLATELET
BASOS ABS: 0 10*3/uL (ref 0–0.1)
BASOS PCT: 1 %
Eosinophils Absolute: 0.1 10*3/uL (ref 0–0.7)
Eosinophils Relative: 1 %
HEMATOCRIT: 36.9 % (ref 35.0–47.0)
HEMOGLOBIN: 12.7 g/dL (ref 12.0–16.0)
Lymphocytes Relative: 27 %
Lymphs Abs: 0.9 10*3/uL — ABNORMAL LOW (ref 1.0–3.6)
MCH: 31.2 pg (ref 26.0–34.0)
MCHC: 34.3 g/dL (ref 32.0–36.0)
MCV: 91.1 fL (ref 80.0–100.0)
Monocytes Absolute: 0.3 10*3/uL (ref 0.2–0.9)
Monocytes Relative: 10 %
NEUTROS ABS: 2.1 10*3/uL (ref 1.4–6.5)
NEUTROS PCT: 61 %
Platelets: 275 10*3/uL (ref 150–440)
RBC: 4.06 MIL/uL (ref 3.80–5.20)
RDW: 16.1 % — ABNORMAL HIGH (ref 11.5–14.5)
WBC: 3.5 10*3/uL — ABNORMAL LOW (ref 3.6–11.0)

## 2017-05-08 MED ORDER — SODIUM CHLORIDE 0.9 % IV SOLN
300.0000 mg | Freq: Once | INTRAVENOUS | Status: AC
  Administered 2017-05-08: 300 mg via INTRAVENOUS
  Filled 2017-05-08: qty 30

## 2017-05-08 MED ORDER — SODIUM CHLORIDE 0.9 % IV SOLN
Freq: Once | INTRAVENOUS | Status: AC
  Administered 2017-05-08: 10:00:00 via INTRAVENOUS
  Filled 2017-05-08: qty 1000

## 2017-05-08 MED ORDER — SODIUM CHLORIDE 0.9 % IV SOLN
135.0000 mg/m2 | Freq: Once | INTRAVENOUS | Status: AC
  Administered 2017-05-08: 204 mg via INTRAVENOUS
  Filled 2017-05-08: qty 34

## 2017-05-08 MED ORDER — SODIUM CHLORIDE 0.9 % IV SOLN: 10.0000 mg | Freq: Once | INTRAVENOUS | Status: DC

## 2017-05-08 MED ORDER — FAMOTIDINE IN NACL 20-0.9 MG/50ML-% IV SOLN
20.0000 mg | Freq: Once | INTRAVENOUS | Status: AC
  Administered 2017-05-08: 20 mg via INTRAVENOUS
  Filled 2017-05-08: qty 50

## 2017-05-08 MED ORDER — DIPHENHYDRAMINE HCL 50 MG/ML IJ SOLN
25.0000 mg | Freq: Once | INTRAMUSCULAR | Status: AC
  Administered 2017-05-08: 25 mg via INTRAVENOUS
  Filled 2017-05-08: qty 1

## 2017-05-08 MED ORDER — DEXAMETHASONE SODIUM PHOSPHATE 10 MG/ML IJ SOLN
10.0000 mg | Freq: Once | INTRAMUSCULAR | Status: AC
  Administered 2017-05-08: 10 mg via INTRAVENOUS
  Filled 2017-05-08: qty 1

## 2017-05-08 MED ORDER — PEGFILGRASTIM 6 MG/0.6ML ~~LOC~~ PSKT
6.0000 mg | PREFILLED_SYRINGE | Freq: Once | SUBCUTANEOUS | Status: AC
  Administered 2017-05-08: 6 mg via SUBCUTANEOUS
  Filled 2017-05-08: qty 0.6

## 2017-05-08 MED ORDER — PALONOSETRON HCL INJECTION 0.25 MG/5ML
0.2500 mg | Freq: Once | INTRAVENOUS | Status: AC
  Administered 2017-05-08: 0.25 mg via INTRAVENOUS
  Filled 2017-05-08: qty 5

## 2017-05-08 NOTE — Progress Notes (Signed)
Patient reports she has boil she would like assessed, would like to know if she needs genetic testing.

## 2017-05-09 LAB — CA 125: CA 125: 22.3 U/mL (ref 0.0–38.1)

## 2017-05-29 ENCOUNTER — Ambulatory Visit: Payer: Medicare Other

## 2017-06-13 ENCOUNTER — Ambulatory Visit
Admission: RE | Admit: 2017-06-13 | Discharge: 2017-06-13 | Disposition: A | Discharge status: Home / Self Care | Payer: Medicare Other | Source: Ambulatory Visit | Attending: Oncology | Admitting: Oncology

## 2017-06-13 ENCOUNTER — Inpatient Hospital Stay: Payer: Medicare Other | Attending: Oncology

## 2017-06-13 DIAGNOSIS — I700 Atherosclerosis of aorta: Secondary | ICD-10-CM | POA: Diagnosis not present

## 2017-06-13 DIAGNOSIS — I2510 Atherosclerotic heart disease of native coronary artery without angina pectoris: Secondary | ICD-10-CM | POA: Diagnosis not present

## 2017-06-13 DIAGNOSIS — C801 Malignant (primary) neoplasm, unspecified: Secondary | ICD-10-CM | POA: Insufficient documentation

## 2017-06-13 DIAGNOSIS — C786 Secondary malignant neoplasm of retroperitoneum and peritoneum: Secondary | ICD-10-CM

## 2017-06-13 DIAGNOSIS — K449 Diaphragmatic hernia without obstruction or gangrene: Secondary | ICD-10-CM | POA: Diagnosis not present

## 2017-06-13 DIAGNOSIS — Z853 Personal history of malignant neoplasm of breast: Secondary | ICD-10-CM | POA: Insufficient documentation

## 2017-06-13 DIAGNOSIS — Z90710 Acquired absence of both cervix and uterus: Secondary | ICD-10-CM | POA: Insufficient documentation

## 2017-06-13 DIAGNOSIS — K8020 Calculus of gallbladder without cholecystitis without obstruction: Secondary | ICD-10-CM | POA: Insufficient documentation

## 2017-06-13 DIAGNOSIS — Z9221 Personal history of antineoplastic chemotherapy: Secondary | ICD-10-CM | POA: Insufficient documentation

## 2017-06-13 LAB — CBC WITH DIFFERENTIAL/PLATELET
Basophils Absolute: 0 10*3/uL (ref 0–0.1)
Basophils Relative: 1 %
EOS PCT: 5 %
Eosinophils Absolute: 0.2 10*3/uL (ref 0–0.7)
HCT: 38.1 % (ref 35.0–47.0)
HEMOGLOBIN: 13.1 g/dL (ref 12.0–16.0)
LYMPHS ABS: 0.9 10*3/uL — AB (ref 1.0–3.6)
LYMPHS PCT: 28 %
MCH: 31.5 pg (ref 26.0–34.0)
MCHC: 34.3 g/dL (ref 32.0–36.0)
MCV: 91.9 fL (ref 80.0–100.0)
MONOS PCT: 10 %
Monocytes Absolute: 0.3 10*3/uL (ref 0.2–0.9)
NEUTROS PCT: 56 %
Neutro Abs: 1.9 10*3/uL (ref 1.4–6.5)
Platelets: 261 10*3/uL (ref 150–440)
RBC: 4.15 MIL/uL (ref 3.80–5.20)
RDW: 14.8 % — ABNORMAL HIGH (ref 11.5–14.5)
WBC: 3.3 10*3/uL — ABNORMAL LOW (ref 3.6–11.0)

## 2017-06-13 LAB — COMPREHENSIVE METABOLIC PANEL
ALK PHOS: 53 U/L (ref 38–126)
ALT: 14 U/L (ref 14–54)
ANION GAP: 5 (ref 5–15)
AST: 18 U/L (ref 15–41)
Albumin: 4.3 g/dL (ref 3.5–5.0)
BUN: 13 mg/dL (ref 6–20)
CALCIUM: 9.5 mg/dL (ref 8.9–10.3)
CO2: 28 mmol/L (ref 22–32)
Chloride: 103 mmol/L (ref 101–111)
Creatinine, Ser: 0.66 mg/dL (ref 0.44–1.00)
Glucose, Bld: 99 mg/dL (ref 65–99)
Potassium: 3.9 mmol/L (ref 3.5–5.1)
Sodium: 136 mmol/L (ref 135–145)
TOTAL PROTEIN: 7.1 g/dL (ref 6.5–8.1)
Total Bilirubin: 1.3 mg/dL — ABNORMAL HIGH (ref 0.3–1.2)

## 2017-06-13 MED ORDER — IOPAMIDOL (ISOVUE-300) INJECTION 61%
100.0000 mL | Freq: Once | INTRAVENOUS | Status: AC | PRN
  Administered 2017-06-13: 100 mL via INTRAVENOUS

## 2017-06-26 ENCOUNTER — Inpatient Hospital Stay (HOSPITAL_BASED_OUTPATIENT_CLINIC_OR_DEPARTMENT_OTHER): Payer: Medicare Other | Admitting: Obstetrics and Gynecology

## 2017-06-26 ENCOUNTER — Inpatient Hospital Stay: Payer: Medicare Other | Attending: Oncology | Admitting: Oncology

## 2017-06-26 VITALS — BP 148/78 | HR 93 | Temp 97.8°F | Resp 18 | Ht 62.0 in | Wt 119.3 lb

## 2017-06-26 DIAGNOSIS — D72819 Decreased white blood cell count, unspecified: Secondary | ICD-10-CM | POA: Diagnosis not present

## 2017-06-26 DIAGNOSIS — I10 Essential (primary) hypertension: Secondary | ICD-10-CM | POA: Diagnosis not present

## 2017-06-26 DIAGNOSIS — D701 Agranulocytosis secondary to cancer chemotherapy: Secondary | ICD-10-CM | POA: Diagnosis not present

## 2017-06-26 DIAGNOSIS — C786 Secondary malignant neoplasm of retroperitoneum and peritoneum: Secondary | ICD-10-CM

## 2017-06-26 DIAGNOSIS — R590 Localized enlarged lymph nodes: Secondary | ICD-10-CM | POA: Insufficient documentation

## 2017-06-26 DIAGNOSIS — T451X5S Adverse effect of antineoplastic and immunosuppressive drugs, sequela: Secondary | ICD-10-CM | POA: Insufficient documentation

## 2017-06-26 DIAGNOSIS — Z9221 Personal history of antineoplastic chemotherapy: Secondary | ICD-10-CM | POA: Diagnosis not present

## 2017-06-26 DIAGNOSIS — Z79899 Other long term (current) drug therapy: Secondary | ICD-10-CM | POA: Diagnosis not present

## 2017-06-26 DIAGNOSIS — Z7982 Long term (current) use of aspirin: Secondary | ICD-10-CM | POA: Diagnosis not present

## 2017-06-26 DIAGNOSIS — Z5111 Encounter for antineoplastic chemotherapy: Secondary | ICD-10-CM | POA: Insufficient documentation

## 2017-06-26 DIAGNOSIS — C801 Malignant (primary) neoplasm, unspecified: Secondary | ICD-10-CM

## 2017-06-26 DIAGNOSIS — C5700 Malignant neoplasm of unspecified fallopian tube: Secondary | ICD-10-CM

## 2017-06-26 DIAGNOSIS — J45909 Unspecified asthma, uncomplicated: Secondary | ICD-10-CM

## 2017-06-26 DIAGNOSIS — M810 Age-related osteoporosis without current pathological fracture: Secondary | ICD-10-CM | POA: Insufficient documentation

## 2017-06-26 DIAGNOSIS — Z90710 Acquired absence of both cervix and uterus: Secondary | ICD-10-CM | POA: Diagnosis not present

## 2017-06-26 DIAGNOSIS — Z87891 Personal history of nicotine dependence: Secondary | ICD-10-CM | POA: Insufficient documentation

## 2017-06-26 DIAGNOSIS — C569 Malignant neoplasm of unspecified ovary: Secondary | ICD-10-CM | POA: Insufficient documentation

## 2017-06-26 NOTE — Progress Notes (Signed)
Wants to know of ct scan, and no concerns, jsut how she is doing now.

## 2017-06-26 NOTE — Progress Notes (Signed)
Gynecologic Oncology Interval Visit   Referring Provider: Benjaman Kindler, MD  Chief Concern:  advanced tubal cancer s/p chemotherapy.   Subjective:  Lisa Hayden is a 83 y.o. female who is seen in consultation from Dr. 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   She completed 4 cycles of carboplatinum and Taxol with Dr. Grayland Ormond. She has tolerated therapy very well and has no significant toxicity other than cognitive issues.     Lab Results  Component Value Date   CA125 22.3 05/08/2017   CA125 74.8 (H) 01/15/2017   CT scan abdomen/pelvis 06/13/2017 IMPRESSION: 1. Marked response to therapy of omental/peritoneal metastasis. No convincing evidence of residual peritoneal disease. 2. Decreased size of abdominal retroperitoneal nodes. Slight enlargement of a left inguinal node. Findings suggest mixed response to therapy of nodal metastasis. "Vascular/Lymphatic: Aortic and branch vessel atherosclerosis. Left periaortic node measures 7 mm on image 24/series 2 versus 10 mm on the prior exam. A left inguinal node measures 11 x 19 mm on image 60/series 2. Compare 9 x 16 mm on the prior."  3. No residual or recurrent small bowel obstruction. 4. Cholelithiasis. 5.  Aortic Atherosclerosis (ICD10-I70.0). 6.  Possible constipation.    Lab Results  Component Value Date   CA125 22.3 05/08/2017   CA125 74.8 (H) 01/15/2017    She presents to discuss subsequent management.  Genetic testing pending.   Gynecologic Oncology History Lisa Hayden is a 83 y.o. female who is seen in consultation from Dr. 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 She was transferred to [facility] for care.   CT scan abdomen and pelvis on 01/15/2017 IMPRESSION: Cholelithiasis and distal  small bowel obstruction is noted with transition zone seen in the pelvis, most likely due to adjacent peritoneal implant or metastatic disease. Omental caking is seen in left lower quadrant, with another peritoneal implant seen in left posterior pelvis. This is concerning for peritoneal carcinomatosis.  CXR; negative for metastasis  She was transferred to [facility] and on 01/23/3017 underwent a diagnostic laparoscopy conversion to laparotomy with TAH BSO, omentectomy, SBRxRA and optimal tumor debulking.   Pathology  A. 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  B. Small bowel, partial resection: Metastatic high grade serous carcinoma. Two lymph nodes, all negative for metastatic carcinoma (0/2).  C. Colon mesentery nodule, resection: Metastatic high grade serous carcinoma.  D. 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 Ovary with no pathologic diagnosis.  E. Right ovary and right fallopian tube, right salpingo-oophorectomy: Ovary and fallopian tube with no pathologic diagnosis. Negative for malignancy.  F. Uterus, hysterectomy: Uterus (50 grams): Endometrium:Inactive Myometrium: No pathologic diagnosis. Cervix: Negative for malignancy. Serosa: Negative for malignancy.   OVARY or FALLOPIAN TUBE: Oophorectomy, Salpingectomy, Salpingo-Oophorectomy, Subtotal Oophorectomy or Removal of Tumor in Fragments, Hysterectomy With Salpingo-Oophorectomy or Salpingectomy(Ovary FT - All Specimens)    Genetic testing: pending, deferred to Dr. Grayland Ormond  Problem List: Patient Active Problem List   Diagnosis Date Noted  . Malignant neoplasm of ovary ([place]) 06/26/2017  . Carcinoma of fallopian tube ([place]) 02/27/2017  . Protein-calorie malnutrition, severe 01/18/2017  . Peritoneal carcinomatosis ([place])  01/16/2017  . SBO (small bowel obstruction) ([place]) 01/16/2017  . Small bowel obstruction ([place])   .  Malignant neoplasm of colon ([place]) 05/08/2016  . Reactive airway disease 05/04/2015  . Bronchitis 05/04/2015  . Asthma without status asthmaticus 05/03/2015  . Back ache 05/03/2015  . Body mass index (BMI) of 24.0-24.9 in adult 05/03/2015  . Breast CA ([place]) 05/03/2015  . Blood pressure elevated 05/03/2015  . Foot pain 05/03/2015  . BP (high blood pressure) 05/03/2015  . Leg pain 05/03/2015  . Lichen sclerosus of female genitalia 05/03/2015  . Neutropenia ([place]) 05/03/2015  . Tendinitis of wrist 05/03/2015  . Atrophy of vagina 05/03/2015  . Avitaminosis D 05/03/2015  . CN (constipation) 01/11/2010  . Mechanical and motor problems with internal organs 09/21/2009  . Arthralgia of shoulder 08/31/2009  . Personal history of malignant neoplasm of breast 04/13/2009  . OP (osteoporosis) 04/13/2009    Past Medical History: Past Medical History:  Diagnosis Date  . Asthma   . Cancer ([place])   . Hypertension   . Osteoporosis     Past Surgical History: Past Surgical History:  Procedure Laterality Date  . 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 APPENDECTOMY  1953  . BREAST LUMPECTOMY Left 10/29/94   negative nodes. Treated with radiation only  . BREAST SURGERY    . BUNIONECTOMY  1979  . CATARACT EXTRACTION, BILATERAL     left 07/29/2012; right 06/24/2012  . COLON SURGERY  05/1991   secondary to cancer   . ROTATOR CUFF REPAIR Right 01/25/10  . TONSILLECTOMY  1938   and adenoidectomy    Past Gynecologic History:  As per interval history  OB History:  OB History  Gravida Para Term Preterm AB Living  1 1          SAB TAB Ectopic Multiple Live Births               # Outcome Date GA Lbr Len/2nd Weight Sex Delivery Anes PTL Lv  1 Para               Family History: Family History  Problem Relation Age of Onset  . CVA Mother   .  Congestive Heart Failure Mother   . Diabetes Sister   . Asthma Father     Social History: Social History   Social History  . Marital status: Married    Spouse name: [name]  . Number of children: 1  . Years of education: College   Occupational History  . Retired    Social History Main Topics  . Smoking status: Former Research scientist (life sciences)  . Smokeless tobacco: Never Used  . Alcohol use 4.2 oz/week    7 Standard drinks or equivalent per week     Comment: glass or beer at dinner  . Drug use: No  . Sexual activity: Not on file   Other Topics Concern  . Not on file   Social History Narrative  . No narrative on file    Allergies: Allergies  Allergen Reactions  . Actonel [Risedronate Sodium] Other (See Comments)    headache  . Cortisone     very alert and did not sleep for 24 hrs  . Daypro  [Oxaprozin]   . Meloxicam     Current Medications: Current Outpatient Prescriptions  Medication Sig Dispense Refill  . alendronate (FOSAMAX) 70 MG tablet Take 1 tablet (70 mg total) by mouth once a week. On Sunday 12 tablet 4  . aspirin 81 MG chewable tablet Chew 81 mg by mouth daily.    . cholecalciferol (  VITAMIN D) 1000 UNITS tablet Take 2,000 Units by mouth every evening. With dinner     . ondansetron (ZOFRAN) 8 MG tablet Take 1 tablet (8 mg total) by mouth every 8 (eight) hours as needed for nausea or vomiting. (Patient not taking: Reported on 06/26/2017) 60 tablet 0  . prochlorperazine (COMPAZINE) 10 MG tablet Take 1 tablet (10 mg total) by mouth every 6 (six) hours as needed for nausea or vomiting. (Patient not taking: Reported on 06/26/2017) 60 tablet 0   No current facility-administered medications for this visit.     Review of Systems General: negative  Skin: negative HEENT: negative Pulmonary: negative Cardiac: negative Gastrointestinal: positive for diarrhea and alternating constipation that have improved. No nausea or vomiting Genitourinary/Sexual: negative Ob/Gyn: negative   Musculoskeletal: negative Hematology: easy bruising Neurologic/Psych: negative  Objective:  Physical Examination:  BP (!) 148/78   Pulse 93   Temp 97.8 F (36.6 C) (Oral)   Resp 18   Ht 5\' 2"  (1.575 m)   Wt 119 lb 4.8 oz (54.1 kg)   BMI 21.82 kg/m    ECOG Performance Status: 1 - Symptomatic but completely ambulatory  General appearance: alert, cooperative and appears stated age HEENT:PERRLA, extra ocular movement intact and sclera clear, anicteric Lymph node survey: non-palpable, axillary, right inguinal, supraclavicular. Left inguinal adenopathy approximately 2 cm irregular, nontender, and nodular.  Cardiovascular: regular rate and rhythm Respiratory: normal air entry, lungs clear to auscultation Abdomen: soft, non-tender, without masses or organomegaly, no hernias and well healed incision. The RLQ port incision is puckered previously palpated nodularity around the port site has resolved.  Back: inspection of back is normal Extremities: extremities normal, atraumatic, no cyanosis or edema Skin exam - normal. No extensive bruising.  Neurological exam reveals alert, oriented, normal speech, no focal findings or movement disorder noted.  Pelvic exam: exam chaperoned VULVA: normal appearing vulva with no masses, tenderness or lesions, VAGINA: normal appearing vagina with normal color and discharge, no lesions, CERVIX: surgically absent, UTERUS: surgically absent, vaginal cuff well healed, ADNEXA: no masses, surgically absent bilateral, RECTAL:  Confirmatory.      Lab Review   CMP     Component Value Date/Time   NA 136 06/13/2017 0925   NA 142 05/09/2016 0953   K 3.9 06/13/2017 0925   CL 103 06/13/2017 0925   CO2 28 06/13/2017 0925   GLUCOSE 99 06/13/2017 0925   BUN 13 06/13/2017 0925   BUN 13 05/09/2016 0953   CREATININE 0.66 06/13/2017 0925   CALCIUM 9.5 06/13/2017 0925   PROT 7.1 06/13/2017 0925   PROT 6.5 05/09/2016 0953   ALBUMIN 4.3 06/13/2017 0925   ALBUMIN  4.3 05/09/2016 0953   AST 18 06/13/2017 0925   ALT 14 06/13/2017 0925   ALKPHOS 53 06/13/2017 0925   BILITOT 1.3 (H) 06/13/2017 0925   BILITOT 0.9 05/09/2016 0953   GFRNONAA >60 06/13/2017 0925   GFRAA >60 06/13/2017 0925     Lab Results  Component Value Date   WBC 3.3 (L) 06/13/2017   HGB 13.1 06/13/2017   HCT 38.1 06/13/2017   MCV 91.9 06/13/2017   PLT 261 06/13/2017     Radiologic Imaging: CT reviewed.     Assessment:  Lisa Hayden is a 83 y.o. female diagnosed with advanced HGS fallopian tube cancer s/p exploratory laparotomy with TAH BSO, omentectomy, SBRxRA and optimal tumor debulking.  S/p 4 cycles of chemotherapy with normalization of CA125. CT scan is not definitive for progressive disease. However, she has  increasing left inguinal adenopathy that may represent progressive/persistent disease.    Elevated bilirubin, most likely due to taxane therapy.   Medical co-morbidities complicating care: Protein-calorie malnutrition, severe; h/o Malignant neoplasm of colon ([place]) and breast cancer; Asthma; osteoporosis, and frailty.  Plan:   Problem List Items Addressed This Visit      Genitourinary   Carcinoma of fallopian tube ([place]) - Primary     Obtain FNA of left inguinal node; if positive consider 2-3 additional cycles of therapy as she definitely responded to treatment given her CA125 results. If negative she is potentially interested in the Tap Immune trial.   Genetic testing deferred to Dr. Grayland Ormond. We discussed with him today.   Elevated bilirubin, Dr. Grayland Ormond aware. Minimal elevation and asymptomatic. Continue to follow.   She asked about mammogram screening - given her active cancer diagnosis and age mammogram screening is not indicated.   The patient's diagnosis, an outline of the further diagnostic and laboratory studies which will be required, the recommendation, and alternatives were discussed.  All questions were answered to the patient's  satisfaction.  A total of 60 minutes were spent with the patient/family today; 75% was spent in education, counseling and coordination of care for tubal cancer.      Gillis Ends, MD   CC:  Benjaman Kindler, MD  Lelon Huh, MD

## 2017-06-26 NOTE — Progress Notes (Signed)
[place]  [phone] Fax:([phone]) [phone]  ID: Lisa Hayden OB: [birth year]  MR#: [id number]  MWU#:[id number]  Patient Care Team: Birdie Sons, MD as PCP - General (Family Medicine) Clent Jacks, RN as Registered Nurse  CHIEF COMPLAINT: Peritoneal carcinomatosis.  INTERVAL HISTORY: Patient returns to clinic today for further evaluation and discussion of her imaging results. She was also evaluated by gynecology oncology. She currently feels well and is asymptomatic. She does not complain of weakness or fatigue today. She has no neurologic complaints. She denies any recent fevers. She has a good appetite and has maintained her weight. She has no chest pain or shortness of breath. She denies any nausea, vomiting, constipation, or diarrhea. She has no abdominal pain. She has no urinary complaints. Patient offers no further specific complaints today.  REVIEW OF SYSTEMS:   Review of Systems  Constitutional: Negative for fever, malaise/fatigue and weight loss.  Respiratory: Negative.  Negative for cough and shortness of breath.   Cardiovascular: Negative.  Negative for chest pain and leg swelling.  Gastrointestinal: Negative.  Negative for abdominal pain.  Genitourinary: Negative.   Musculoskeletal: Negative.   Skin: Negative.  Negative for rash.  Neurological: Negative for sensory change and weakness.  Psychiatric/Behavioral: Negative.  The patient is not nervous/anxious.    As per HPI. Otherwise, a complete review of systems is negative.  PAST MEDICAL HISTORY: Past Medical History:  Diagnosis Date  . Asthma   . Cancer ([place])   . Hypertension   . Osteoporosis     PAST SURGICAL HISTORY: Past Surgical History:  Procedure Laterality Date  . 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 APPENDECTOMY  1953  . BREAST LUMPECTOMY Left 10/29/94   negative nodes. Treated with radiation only  . BREAST  SURGERY    . BUNIONECTOMY  1979  . CATARACT EXTRACTION, BILATERAL     left 07/29/2012; right 06/24/2012  . COLON SURGERY  05/1991   secondary to cancer   . ROTATOR CUFF REPAIR Right 01/25/10  . TONSILLECTOMY  1938   and adenoidectomy    FAMILY HISTORY: Family History  Problem Relation Age of Onset  . CVA Mother   . Congestive Heart Failure Mother   . Diabetes Sister   . Asthma Father     ADVANCED DIRECTIVES (Y/N):  N  HEALTH MAINTENANCE: Social History  Substance Use Topics  . Smoking status: Former Research scientist (life sciences)  . Smokeless tobacco: Never Used  . Alcohol use 4.2 oz/week    7 Standard drinks or equivalent per week     Comment: glass or beer at dinner     Colonoscopy:  PAP:  Bone density:  Lipid panel:  Allergies  Allergen Reactions  . Actonel [Risedronate Sodium] Other (See Comments)    headache  . Cortisone     very alert and did not sleep for 24 hrs  . Daypro  [Oxaprozin]   . Meloxicam     Current Outpatient Prescriptions  Medication Sig Dispense Refill  . alendronate (FOSAMAX) 70 MG tablet Take 1 tablet (70 mg total) by mouth once a week. On Sunday 12 tablet 4  . aspirin 81 MG chewable tablet Chew 81 mg by mouth daily.    . cholecalciferol (VITAMIN D) 1000 UNITS tablet Take 2,000 Units by mouth every evening. With dinner     . ondansetron (ZOFRAN) 8 MG tablet Take 1 tablet (8 mg total) by mouth every 8 (eight) hours as needed  for nausea or vomiting. (Patient not taking: Reported on 06/26/2017) 60 tablet 0  . prochlorperazine (COMPAZINE) 10 MG tablet Take 1 tablet (10 mg total) by mouth every 6 (six) hours as needed for nausea or vomiting. (Patient not taking: Reported on 06/26/2017) 60 tablet 0   No current facility-administered medications for this visit.     OBJECTIVE: There were no vitals filed for this visit.   There is no height or weight on file to calculate BMI.    ECOG FS:0 - Asymptomatic  General: Well-developed, well-nourished, no acute distress. Eyes: Pink  conjunctiva, anicteric sclera. Lungs: Clear to auscultation bilaterally. Heart: Regular rate and rhythm. No rubs, murmurs, or gallops. Abdomen: Soft, nontender, nondistended. No organomegaly noted, normoactive bowel sounds. Musculoskeletal: No edema, cyanosis, or clubbing. Neuro: Alert, answering all questions appropriately. Cranial nerves grossly intact. Skin: No rashes or petechiae noted. Psych: Normal affect.   LAB RESULTS:  Lab Results  Component Value Date   NA 136 06/13/2017   K 3.9 06/13/2017   CL 103 06/13/2017   CO2 28 06/13/2017   GLUCOSE 99 06/13/2017   BUN 13 06/13/2017   CREATININE 0.66 06/13/2017   CALCIUM 9.5 06/13/2017   PROT 7.1 06/13/2017   ALBUMIN 4.3 06/13/2017   AST 18 06/13/2017   ALT 14 06/13/2017   ALKPHOS 53 06/13/2017   BILITOT 1.3 (H) 06/13/2017   GFRNONAA >60 06/13/2017   GFRAA >60 06/13/2017    Lab Results  Component Value Date   WBC 3.3 (L) 06/13/2017   NEUTROABS 1.9 06/13/2017   HGB 13.1 06/13/2017   HCT 38.1 06/13/2017   MCV 91.9 06/13/2017   PLT 261 06/13/2017   Lab Results  Component Value Date   CA125 22.3 05/08/2017     STUDIES: Ct Abdomen Pelvis W Contrast  Result Date: 06/13/2017 CLINICAL DATA:  Status post chemotherapy. Peritoneal carcinomatosis. Remote history of left breast cancer. EXAM: CT ABDOMEN AND PELVIS WITH CONTRAST TECHNIQUE: Multidetector CT imaging of the abdomen and pelvis was performed using the standard protocol following bolus administration of intravenous contrast. CONTRAST:  155mL ISOVUE-300 IOPAMIDOL (ISOVUE-300) INJECTION 61% COMPARISON:  Plain films 01/21/2017.  CT 01/15/2017. FINDINGS: Lower chest: Clear lung bases. Normal heart size without pericardial or pleural effusion. Small hiatal hernia. Hepatobiliary: Too small to characterize lesions in both liver lobes are felt to be similar. Multiple small gallstones without acute cholecystitis or biliary duct dilatation. Pancreas: Normal, without mass or ductal  dilatation. Spleen: Normal in size, without focal abnormality. Adrenals/Urinary Tract: Normal adrenal glands. Normal kidneys, without hydronephrosis. Normal urinary bladder. Stomach/Bowel: Normal remainder of the stomach. Colonic stool burden suggests constipation. The cecum extends into the central pelvis. Normal terminal ileum. No residual or recurrent small bowel obstruction. Vascular/Lymphatic: Aortic and branch vessel atherosclerosis. Left periaortic node measures 7 mm on image 24/series 2 versus 10 mm on the prior exam. A left inguinal node measures 11 x 19 mm on image 60/series 2. Compare 9 x 16 mm on the prior. Reproductive: Hysterectomy.  No adnexal mass. Other: Resolution of pelvic free fluid. Resolution of omental caking, without convincing evidence of residual peritoneal disease. Musculoskeletal: Disc bulges at L4-5 and less so L3-4. IMPRESSION: 1. Marked response to therapy of omental/peritoneal metastasis. No convincing evidence of residual peritoneal disease. 2. Decreased size of abdominal retroperitoneal nodes. Slight enlargement of a left inguinal node. Findings suggest mixed response to therapy of nodal metastasis. 3. No residual or recurrent small bowel obstruction. 4. Cholelithiasis. 5.  Aortic Atherosclerosis (ICD10-I70.0). 6.  Possible constipation.  Electronically Signed   By: Abigail Miyamoto M.D.   On: 06/13/2017 14:18    ASSESSMENT: Peritoneal carcinomatosis  PLAN:    1. Peritoneal carcinomatosis: Patient completed 4 cycles of carboplatinum and Taxol on May 08, 2017. Restaging CT scan on June 13, 2017 reviewed independently and reported as above with significant improvement of patient's disease burden. Patient had a palpable inguinal lymph node that gynecology oncology recommended biopsy. If biopsy is positive, we will give 2 additional cycles of carboplatinum and Taxol. If biopsy is negative patient will return to clinic in 3 months with repeat imaging and further evaluation.   Patient  has been instructed to keep her follow-up with gynecology oncology as scheduled. 2. Leukopenia: Mild, monitor. 3. Hypokalemia: Resolved. 4. Elevated bilirubin: Mild, monitor.   Approximately 30 minutes was spent in discussion, which greater than 50% was consultation.  Patient expressed understanding and was in agreement with this plan. She also understands that She can call clinic at any time with any questions, concerns, or complaints.   Cancer Staging Peritoneal carcinomatosis [facility]) Staging form: Soft Tissue Sarcoma of the Abdomen and Thoracic Visceral Organs, AJCC 8th Edition - Clinical stage from 02/13/2017: cT4c, cN1, cM0 - Signed by Lloyd Huger, MD on 02/13/2017   Lloyd Huger, MD   06/29/2017 4:18 PM

## 2017-06-27 ENCOUNTER — Telehealth: Payer: Self-pay

## 2017-06-27 ENCOUNTER — Other Ambulatory Visit: Payer: Self-pay

## 2017-06-27 DIAGNOSIS — C5700 Malignant neoplasm of unspecified fallopian tube: Secondary | ICD-10-CM

## 2017-06-27 NOTE — Telephone Encounter (Signed)
  Oncology Nurse Navigator Documentation Voicemail left with Ms. Pfeifle to return call regarding biopsy appointment. Navigator Location: [facility] (06/27/17 1000)   )Navigator Encounter Type: Telephone (06/27/17 1000) Telephone: Lahoma Crocker Call;Appt Confirmation/Clarification (06/27/17 1000)                                                  Time Spent with Patient: 15 (06/27/17 1000)

## 2017-07-02 ENCOUNTER — Other Ambulatory Visit: Payer: Self-pay | Admitting: Radiology

## 2017-07-02 ENCOUNTER — Ambulatory Visit
Admission: RE | Admit: 2017-07-02 | Discharge: 2017-07-02 | Disposition: A | Discharge status: Home / Self Care | Payer: Medicare Other | Source: Ambulatory Visit | Attending: Obstetrics and Gynecology | Admitting: Obstetrics and Gynecology

## 2017-07-02 DIAGNOSIS — Z90710 Acquired absence of both cervix and uterus: Secondary | ICD-10-CM | POA: Diagnosis not present

## 2017-07-02 DIAGNOSIS — Z853 Personal history of malignant neoplasm of breast: Secondary | ICD-10-CM | POA: Diagnosis not present

## 2017-07-02 DIAGNOSIS — R590 Localized enlarged lymph nodes: Secondary | ICD-10-CM | POA: Diagnosis present

## 2017-07-02 DIAGNOSIS — Z7983 Long term (current) use of bisphosphonates: Secondary | ICD-10-CM | POA: Insufficient documentation

## 2017-07-02 DIAGNOSIS — Z9221 Personal history of antineoplastic chemotherapy: Secondary | ICD-10-CM | POA: Insufficient documentation

## 2017-07-02 DIAGNOSIS — K8020 Calculus of gallbladder without cholecystitis without obstruction: Secondary | ICD-10-CM | POA: Diagnosis not present

## 2017-07-02 DIAGNOSIS — Z888 Allergy status to other drugs, medicaments and biological substances status: Secondary | ICD-10-CM | POA: Diagnosis not present

## 2017-07-02 DIAGNOSIS — I700 Atherosclerosis of aorta: Secondary | ICD-10-CM | POA: Insufficient documentation

## 2017-07-02 DIAGNOSIS — Z8544 Personal history of malignant neoplasm of other female genital organs: Secondary | ICD-10-CM | POA: Insufficient documentation

## 2017-07-02 DIAGNOSIS — C5700 Malignant neoplasm of unspecified fallopian tube: Secondary | ICD-10-CM

## 2017-07-02 DIAGNOSIS — Z7982 Long term (current) use of aspirin: Secondary | ICD-10-CM | POA: Diagnosis not present

## 2017-07-02 DIAGNOSIS — C786 Secondary malignant neoplasm of retroperitoneum and peritoneum: Secondary | ICD-10-CM | POA: Insufficient documentation

## 2017-07-02 DIAGNOSIS — C774 Secondary and unspecified malignant neoplasm of inguinal and lower limb lymph nodes: Secondary | ICD-10-CM | POA: Diagnosis not present

## 2017-07-02 DIAGNOSIS — Z90722 Acquired absence of ovaries, bilateral: Secondary | ICD-10-CM | POA: Diagnosis not present

## 2017-07-02 MED ORDER — SODIUM CHLORIDE 0.9 % IV SOLN: INTRAVENOUS | Status: DC

## 2017-07-02 NOTE — Discharge Instructions (Signed)
Needle Biopsy, Care After °Refer to this sheet in the next few weeks. These instructions provide you with information about caring for yourself after your procedure. Your health care provider may also give you more specific instructions. Your treatment has been planned according to current medical practices, but problems sometimes occur. Call your health care provider if you have any problems or questions after your procedure. °What can I expect after the procedure? °After your procedure, it is common to have soreness, bruising, or mild pain at the biopsy site. This should go away in a few days. °Follow these instructions at home: °· Rest as directed by your health care provider. °· Take medicines only as directed by your health care provider. °· There are many different ways to close and cover the biopsy site, including stitches (sutures), skin glue, and adhesive strips. Follow your health care provider's instructions about: °? Biopsy site care. °? Bandage (dressing) changes and removal. °? Biopsy site closure removal. °· Check your biopsy site every day for signs of infection. Watch for: °? Redness, swelling, or pain. °? Fluid, blood, or pus. °Contact a health care provider if: °· You have a fever. °· You have redness, swelling, or pain at the biopsy site that lasts longer than a few days. °· You have fluid, blood, or pus coming from the biopsy site. °· You feel nauseous. °· You vomit. °Get help right away if: °· You have shortness of breath. °· You have trouble breathing. °· You have chest pain. °· You feel dizzy or you faint. °· You have bleeding that does not stop with pressure or a bandage. °· You cough up blood. °· You have pain in your abdomen. °This information is not intended to replace advice given to you by your health care provider. Make sure you discuss any questions you have with your health care provider. °Document Released: 03/22/2015 Document Revised: 04/12/2016 Document Reviewed:  11/01/2014 °Elsevier Interactive Patient Education © 2018 [employer] ° °

## 2017-07-02 NOTE — H&P (Signed)
Referring Physician(s): Gillis Ends  Supervising Physician: Markus Daft  Patient Status:  [facility] OP  Chief Complaint: "I'm having a biopsy"   Subjective: Patient familiar to IR service from prior peritoneal mass biopsy in March of this year which yielded high-grade poorly differentiated carcinoma. She has a remote history of breast cancer as well as left fallopian tube high-grade serous carcinoma. She has undergone laparoscopy with conversion to laparotomy with TAH/BSO, omentectomy, small bowel resection and colon mesentery nodule resection. She is status post chemotherapy. Recent CT of the abdomen and pelvis on 06/13/17 revealed: 1. Marked response to therapy of omental/peritoneal metastasis. No convincing evidence of residual peritoneal disease. 2. Decreased size of abdominal retroperitoneal nodes. Slight enlargement of a left inguinal node. Findings suggest mixed response to therapy of nodal metastasis. 3. No residual or recurrent small bowel obstruction. 4. Cholelithiasis. 5.  Aortic Atherosclerosis (ICD10-I70.0). 6.  Possible constipation  She presents again today for ultrasound-guided left inguinal lymph node biopsy for further evaluation. She currently denies fever, headache, chest pain, dyspnea, cough, significant abdominal/back pain, nausea, vomiting or abnormal bleeding. Allergies: Actonel [risedronate sodium]; Cortisone; Daypro  [oxaprozin]; and Meloxicam  Medications: Prior to Admission medications   Medication Sig Start Date End Date Taking? Authorizing Provider  alendronate (FOSAMAX) 70 MG tablet Take 1 tablet (70 mg total) by mouth once a week. On Sunday 11/07/16   Birdie Sons, MD  aspirin 81 MG chewable tablet Chew 81 mg by mouth daily.    [provider]  cholecalciferol (VITAMIN D) 1000 UNITS tablet Take 2,000 Units by mouth every evening. With dinner     [provider]  ondansetron (ZOFRAN) 8 MG tablet Take 1 tablet (8 mg total)  by mouth every 8 (eight) hours as needed for nausea or vomiting. Patient not taking: Reported on 06/26/2017 03/06/17   Lloyd Huger, MD  prochlorperazine (COMPAZINE) 10 MG tablet Take 1 tablet (10 mg total) by mouth every 6 (six) hours as needed for nausea or vomiting. Patient not taking: Reported on 06/26/2017 03/06/17   Lloyd Huger, MD     Vital Signs: pending   Physical Exam awake, alert. Chest clear to auscultation bilaterally. Heart with regular rhythm, occasional ectopy noted. Abdomen soft, positive bowel sounds, nontender. No significant lower extremity edema.  Imaging: No results found.  Labs:  CBC:  Recent Labs  03/27/17 0831 04/17/17 0830 05/08/17 0808 06/13/17 0925  WBC 4.4 4.3 3.5* 3.3*  HGB 12.2 12.2 12.7 13.1  HCT 35.2 35.3 36.9 38.1  PLT 389 252 275 261    COAGS:  Recent Labs  01/15/17 2249  INR 1.17  APTT 33    BMP:  Recent Labs  03/27/17 0831 04/17/17 0830 05/08/17 0808 06/13/17 0925  NA 137 139 140 136  K 3.8 3.4* 4.0 3.9  CL 105 106 106 103  CO2 26 28 27 28   GLUCOSE 86 80 140* 99  BUN 16 16 15 13   CALCIUM 9.4 9.4 9.5 9.5  CREATININE 0.63 0.60 0.66 0.66  GFRNONAA >60 >60 >60 >60  GFRAA >60 >60 >60 >60    LIVER FUNCTION TESTS:  Recent Labs  03/27/17 0831 04/17/17 0830 05/08/17 0808 06/13/17 0925  BILITOT 1.1 0.9 0.8 1.3*  AST 22 20 21 18   ALT 23 15 14 14   ALKPHOS 87 64 65 53  PROT 7.3 7.0 6.8 7.1  ALBUMIN 3.9 4.2 4.0 4.3    Assessment and Plan: Pt with remote history of breast cancer as well as  left fallopian tube high-grade serous carcinoma. She has undergone laparoscopy with conversion to laparotomy with TAH/BSO, omentectomy, small bowel resection and colon mesentery nodule resection at [facility] in March of this year.  She is status post chemotherapy. Recent CT of the abdomen and pelvis on 06/13/17 revealed: 1. Marked response to therapy of omental/peritoneal metastasis. No convincing evidence of residual peritoneal  disease. 2. Decreased size of abdominal retroperitoneal nodes. Slight enlargement of a left inguinal node. Findings suggest mixed response to therapy of nodal metastasis. 3. No residual or recurrent small bowel obstruction. 4. Cholelithiasis. 5.  Aortic Atherosclerosis (ICD10-I70.0). 6.  Possible constipation  She presents again today for ultrasound-guided left inguinal lymph node biopsy for further evaluation.Risks and benefits discussed with the patient/husband including, but not limited to bleeding, infection, damage to adjacent structures or low yield requiring additional tests. All of the patient's questions were answered, patient is agreeable to proceed. Consent signed and in chart. Labs pend.     Electronically Signed: D. Rowe Robert, PA-C 07/02/2017, 12:40 PM   I spent a total of 20 minutes at the the patient's bedside AND on the patient's hospital floor or unit, greater than 50% of which was counseling/coordinating care for ultrasound-guided left inguinal lymph node biopsy

## 2017-07-02 NOTE — Procedures (Signed)
US guided core biopsies of left inguinal lymph node.  6 cores obtained.  Minimal blood loss and no immediate complication.

## 2017-07-02 NOTE — OR Nursing (Signed)
Dr Anselm Pancoast reviewed labs within last 6 months. No new labs needed. Pt reports feeling that she can tolerate procedure with just local anesthetic after talking with Dr Anselm Pancoast. No IV started.

## 2017-07-03 LAB — SURGICAL PATHOLOGY

## 2017-07-08 NOTE — Progress Notes (Signed)
  Oncology Nurse Navigator Documentation Pathology report sent to Dr. Theora Gianotti Navigator Location: [facility] (07/08/17 1100)   )Navigator Encounter Type: Letter/Fax/Email;Diagnostic Results (07/08/17 1100)                                                    Time Spent with Patient: 15 (07/08/17 1100)

## 2017-07-10 ENCOUNTER — Telehealth: Payer: Self-pay | Admitting: *Deleted

## 2017-07-10 ENCOUNTER — Telehealth: Payer: Self-pay | Admitting: Obstetrics and Gynecology

## 2017-07-10 NOTE — Telephone Encounter (Signed)
I contacted Ms Lisa Hayden. Dr. Grayland Ormond and I were able to discuss the radiologic findings and it appears that node was present before and only slightly increased. Given her significant response to therapy we recommended 3 more chemotherapy treatments with carboplatinum and Taxol  Followed by PET/CT imaging. She has already received a call from Dr. Gary Fleet office and is scheduled to see him soon for treatment.  Gillis Ends, MD

## 2017-07-10 NOTE — Telephone Encounter (Signed)
I called pt to update her on current plan of care. Pt informed that she will be scheduled to begin carbo/taxol next week due to residual disease in lymph node that was recently biopsied. Pt had all questions answered and verbalized agreement with plan, she is scheduled for 8/29.

## 2017-07-14 NOTE — Progress Notes (Signed)
[place]  [phone] [phone]  ID: Lisa Hayden OB: [birth year]  MR#: [id number]  RDE#:[id number]  Patient Care Team: Birdie Sons, MD as PCP - General (Family Medicine) Clent Jacks, RN as Registered Nurse  CHIEF COMPLAINT: Peritoneal carcinomatosis.  INTERVAL HISTORY: Patient returns to clinic today for further evaluation and consideration of cycle 5 of carboplatinum and Taxol. Patient recently had a positive biopsy of an inguinal lymph node prompting 3 additional treatments. She currently feels well and is asymptomatic. She does not complain of weakness or fatigue today. She has no neurologic complaints. She denies any recent fevers. She has a good appetite and has maintained her weight. She has no chest pain or shortness of breath. She denies any nausea, vomiting, constipation, or diarrhea. She has no abdominal pain. She has no urinary complaints. Patient offers no specific complaints today.  REVIEW OF SYSTEMS:   Review of Systems  Constitutional: Negative for fever, malaise/fatigue and weight loss.  Respiratory: Negative.  Negative for cough and shortness of breath.   Cardiovascular: Negative.  Negative for chest pain and leg swelling.  Gastrointestinal: Negative.  Negative for abdominal pain.  Genitourinary: Negative.   Musculoskeletal: Negative.   Skin: Negative.  Negative for rash.  Neurological: Negative for sensory change and weakness.  Psychiatric/Behavioral: Negative.  The patient is not nervous/anxious.    As per HPI. Otherwise, a complete review of systems is negative.  PAST MEDICAL HISTORY: Past Medical History:  Diagnosis Date  . Asthma   . Cancer ([place])   . Hypertension   . Osteoporosis     PAST SURGICAL HISTORY: Past Surgical History:  Procedure Laterality Date  . 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 APPENDECTOMY  1953  . BREAST LUMPECTOMY Left  10/29/94   negative nodes. Treated with radiation only  . BREAST SURGERY    . BUNIONECTOMY  1979  . CATARACT EXTRACTION, BILATERAL     left 07/29/2012; right 06/24/2012  . COLON SURGERY  05/1991   secondary to cancer   . ROTATOR CUFF REPAIR Right 01/25/10  . TONSILLECTOMY  1938   and adenoidectomy    FAMILY HISTORY: Family History  Problem Relation Age of Onset  . CVA Mother   . Congestive Heart Failure Mother   . Diabetes Sister   . Asthma Father     ADVANCED DIRECTIVES (Y/N):  N  HEALTH MAINTENANCE: Social History  Substance Use Topics  . Smoking status: Former Research scientist (life sciences)  . Smokeless tobacco: Never Used  . Alcohol use 4.2 oz/week    7 Standard drinks or equivalent per week     Comment: glass or beer at dinner     Colonoscopy:  PAP:  Bone density:  Lipid panel:  Allergies  Allergen Reactions  . Actonel [Risedronate Sodium] Other (See Comments)    headache  . Cortisone     very alert and did not sleep for 24 hrs  . Daypro  [Oxaprozin]   . Meloxicam     Current Outpatient Prescriptions  Medication Sig Dispense Refill  . alendronate (FOSAMAX) 70 MG tablet Take 1 tablet (70 mg total) by mouth once a week. On Sunday 12 tablet 4  . aspirin 81 MG chewable tablet Chew 81 mg by mouth daily.    . cholecalciferol (VITAMIN D) 1000 UNITS tablet Take 2,000 Units by mouth every evening. With dinner     . ondansetron (ZOFRAN) 8 MG tablet Take 1 tablet (8  mg total) by mouth every 8 (eight) hours as needed for nausea or vomiting. (Patient not taking: Reported on 06/26/2017) 60 tablet 0  . prochlorperazine (COMPAZINE) 10 MG tablet Take 1 tablet (10 mg total) by mouth every 6 (six) hours as needed for nausea or vomiting. (Patient not taking: Reported on 06/26/2017) 60 tablet 0   No current facility-administered medications for this visit.    Facility-Administered Medications Ordered in Other Visits  Medication Dose Route Frequency Provider Last Rate Last Dose  . CARBOplatin (PARAPLATIN)  300 mg in sodium chloride 0.9 % 250 mL chemo infusion  300 mg Intravenous Once Lloyd Huger, MD      . PACLitaxel (TAXOL) 204 mg in dextrose 5 % 250 mL chemo infusion (> 69m/m2)  135 mg/m2 (Treatment Plan Recorded) Intravenous Once FLloyd Huger MD      . pegfilgrastim (NEULASTA ONPRO KIT) injection 6 mg  6 mg Subcutaneous Once FLloyd Huger MD        OBJECTIVE: Vitals:   07/17/17 0848  BP: 133/80  Pulse: 86  Resp: 18  Temp: (!) 97.5 F (36.4 C)     Body mass index is 21.73 kg/m.    ECOG FS:0 - Asymptomatic  General: Well-developed, well-nourished, no acute distress. Eyes: Pink conjunctiva, anicteric sclera. Lungs: Clear to auscultation bilaterally. Heart: Regular rate and rhythm. No rubs, murmurs, or gallops. Abdomen: Soft, nontender, nondistended. No organomegaly noted, normoactive bowel sounds. Musculoskeletal: No edema, cyanosis, or clubbing. Neuro: Alert, answering all questions appropriately. Cranial nerves grossly intact. Skin: No rashes or petechiae noted. Psych: Normal affect.   LAB RESULTS:  Lab Results  Component Value Date   NA 140 07/17/2017   K 4.1 07/17/2017   CL 104 07/17/2017   CO2 29 07/17/2017   GLUCOSE 77 07/17/2017   BUN 16 07/17/2017   CREATININE 0.69 07/17/2017   CALCIUM 9.4 07/17/2017   PROT 6.9 07/17/2017   ALBUMIN 3.9 07/17/2017   AST 21 07/17/2017   ALT 13 (L) 07/17/2017   ALKPHOS 44 07/17/2017   BILITOT 0.8 07/17/2017   GFRNONAA >60 07/17/2017   GFRAA >60 07/17/2017    Lab Results  Component Value Date   WBC 2.7 (L) 07/17/2017   NEUTROABS 1.5 07/17/2017   HGB 13.2 07/17/2017   HCT 38.4 07/17/2017   MCV 91.7 07/17/2017   PLT 228 07/17/2017   Lab Results  Component Value Date   CA125 22.3 05/08/2017     STUDIES: [place]  Result Date: 82018/08/23INDICATION: 83year old with peritoneal carcinomatosis. Mixed response to therapy based on recent imaging. Concern for an enlarging left inguinal lymph node.  Request sampling of the left inguinal lymph node. EXAM: ULTRASOUND-GUIDED CORE BIOPSY OF LEFT INGUINAL LYMPH NODE MEDICATIONS: None. ANESTHESIA/SEDATION: None FLUOROSCOPY TIME:  None COMPLICATIONS: None immediate. PROCEDURE: Informed written consent was obtained from the patient after a thorough discussion of the procedural risks, benefits and alternatives. All questions were addressed. A timeout was performed prior to the initiation of the procedure. Ultrasound was used to identify the enlarged left inguinal lymph node. The left groin was prepped with chlorhexidine and a sterile field was created. Skin and soft tissues were anesthetized with 1% lidocaine. Small incision was made. 18 gauge core device was directed into the left inguinal lymph node with ultrasound guidance. A total of 6 core biopsies were obtained. 4 samples placed in formalin and 2 samples placed on Telfa pad with saline. Bandage placed over the puncture site. FINDINGS: Hypoechoic lymph node in left groin  measuring 1.6 x 1.2 x 2.2 cm. Biopsy needle identified within the lymph node on all occasions. IMPRESSION: Successful ultrasound-guided core biopsy of the left inguinal lymph node. Electronically Signed   By: Markus Daft M.D.   On: 07/02/2017 14:54    ASSESSMENT: Peritoneal carcinomatosis  PLAN:    1. Peritoneal carcinomatosis: Patient completed 4 cycles of carboplatinum and Taxol on May 08, 2017. Restaging CT scan on June 13, 2017 reviewed independently and reported as above with significant improvement of patient's disease burden, although she had a left inguinal lymph node that revealed persistent disease. Because of this, we will proceed with cycle 5 of 7 of carboplatinum and Taxol with Neulasta support. Return to clinic in 3 weeks for consideration of cycle 6. Continue follow-up with gynecology oncology as scheduled. 2. Leukopenia: Mild, monitor. Neulasta as above. 3. Hypokalemia: Resolved. 4. Elevated bilirubin: Resolved.    Approximately 30 minutes was spent in discussion, which greater than 50% was consultation.  Patient expressed understanding and was in agreement with this plan. She also understands that She can call clinic at any time with any questions, concerns, or complaints.   Cancer Staging Peritoneal carcinomatosis [facility]) Staging form: Soft Tissue Sarcoma of the Abdomen and Thoracic Visceral Organs, AJCC 8th Edition - Clinical stage from 02/13/2017: cT4c, cN1, cM0 - Signed by Lloyd Huger, MD on 02/13/2017   Lloyd Huger, MD   07/17/2017 11:04 AM

## 2017-07-17 ENCOUNTER — Inpatient Hospital Stay: Payer: Medicare Other

## 2017-07-17 ENCOUNTER — Inpatient Hospital Stay (HOSPITAL_BASED_OUTPATIENT_CLINIC_OR_DEPARTMENT_OTHER): Payer: Medicare Other | Admitting: Oncology

## 2017-07-17 VITALS — BP 133/80 | HR 86 | Temp 97.5°F | Resp 18 | Wt 118.8 lb

## 2017-07-17 DIAGNOSIS — Z87891 Personal history of nicotine dependence: Secondary | ICD-10-CM | POA: Diagnosis not present

## 2017-07-17 DIAGNOSIS — T451X5S Adverse effect of antineoplastic and immunosuppressive drugs, sequela: Secondary | ICD-10-CM

## 2017-07-17 DIAGNOSIS — Z7982 Long term (current) use of aspirin: Secondary | ICD-10-CM | POA: Diagnosis not present

## 2017-07-17 DIAGNOSIS — C786 Secondary malignant neoplasm of retroperitoneum and peritoneum: Secondary | ICD-10-CM

## 2017-07-17 DIAGNOSIS — Z79899 Other long term (current) drug therapy: Secondary | ICD-10-CM

## 2017-07-17 DIAGNOSIS — J45909 Unspecified asthma, uncomplicated: Secondary | ICD-10-CM

## 2017-07-17 DIAGNOSIS — C801 Malignant (primary) neoplasm, unspecified: Principal | ICD-10-CM

## 2017-07-17 DIAGNOSIS — I10 Essential (primary) hypertension: Secondary | ICD-10-CM

## 2017-07-17 DIAGNOSIS — D709 Neutropenia, unspecified: Secondary | ICD-10-CM | POA: Diagnosis not present

## 2017-07-17 DIAGNOSIS — Z5111 Encounter for antineoplastic chemotherapy: Secondary | ICD-10-CM | POA: Diagnosis not present

## 2017-07-17 DIAGNOSIS — M810 Age-related osteoporosis without current pathological fracture: Secondary | ICD-10-CM

## 2017-07-17 DIAGNOSIS — Z90710 Acquired absence of both cervix and uterus: Secondary | ICD-10-CM

## 2017-07-17 LAB — CBC WITH DIFFERENTIAL/PLATELET
BASOS PCT: 1 %
Basophils Absolute: 0 10*3/uL (ref 0–0.1)
EOS ABS: 0.1 10*3/uL (ref 0–0.7)
EOS PCT: 5 %
HCT: 38.4 % (ref 35.0–47.0)
Hemoglobin: 13.2 g/dL (ref 12.0–16.0)
LYMPHS PCT: 28 %
Lymphs Abs: 0.7 10*3/uL — ABNORMAL LOW (ref 1.0–3.6)
MCH: 31.5 pg (ref 26.0–34.0)
MCHC: 34.3 g/dL (ref 32.0–36.0)
MCV: 91.7 fL (ref 80.0–100.0)
Monocytes Absolute: 0.3 10*3/uL (ref 0.2–0.9)
Monocytes Relative: 10 %
Neutro Abs: 1.5 10*3/uL (ref 1.4–6.5)
Neutrophils Relative %: 56 %
PLATELETS: 228 10*3/uL (ref 150–440)
RBC: 4.19 MIL/uL (ref 3.80–5.20)
RDW: 13.2 % (ref 11.5–14.5)
WBC: 2.7 10*3/uL — AB (ref 3.6–11.0)

## 2017-07-17 LAB — COMPREHENSIVE METABOLIC PANEL
ALT: 13 U/L — ABNORMAL LOW (ref 14–54)
AST: 21 U/L (ref 15–41)
Albumin: 3.9 g/dL (ref 3.5–5.0)
Alkaline Phosphatase: 44 U/L (ref 38–126)
Anion gap: 7 (ref 5–15)
BUN: 16 mg/dL (ref 6–20)
CHLORIDE: 104 mmol/L (ref 101–111)
CO2: 29 mmol/L (ref 22–32)
Calcium: 9.4 mg/dL (ref 8.9–10.3)
Creatinine, Ser: 0.69 mg/dL (ref 0.44–1.00)
Glucose, Bld: 77 mg/dL (ref 65–99)
POTASSIUM: 4.1 mmol/L (ref 3.5–5.1)
Sodium: 140 mmol/L (ref 135–145)
Total Bilirubin: 0.8 mg/dL (ref 0.3–1.2)
Total Protein: 6.9 g/dL (ref 6.5–8.1)

## 2017-07-17 MED ORDER — PACLITAXEL CHEMO INJECTION 300 MG/50ML
135.0000 mg/m2 | Freq: Once | INTRAVENOUS | Status: AC
  Administered 2017-07-17: 204 mg via INTRAVENOUS
  Filled 2017-07-17: qty 34

## 2017-07-17 MED ORDER — FAMOTIDINE IN NACL 20-0.9 MG/50ML-% IV SOLN
20.0000 mg | Freq: Once | INTRAVENOUS | Status: AC
  Administered 2017-07-17: 20 mg via INTRAVENOUS
  Filled 2017-07-17: qty 50

## 2017-07-17 MED ORDER — PEGFILGRASTIM 6 MG/0.6ML ~~LOC~~ PSKT
6.0000 mg | PREFILLED_SYRINGE | Freq: Once | SUBCUTANEOUS | Status: AC
  Administered 2017-07-17: 6 mg via SUBCUTANEOUS
  Filled 2017-07-17: qty 0.6

## 2017-07-17 MED ORDER — PALONOSETRON HCL INJECTION 0.25 MG/5ML
0.2500 mg | Freq: Once | INTRAVENOUS | Status: AC
  Administered 2017-07-17: 0.25 mg via INTRAVENOUS
  Filled 2017-07-17: qty 5

## 2017-07-17 MED ORDER — SODIUM CHLORIDE 0.9 % IV SOLN
Freq: Once | INTRAVENOUS | Status: AC
  Administered 2017-07-17: 10:00:00 via INTRAVENOUS
  Filled 2017-07-17: qty 1000

## 2017-07-17 MED ORDER — SODIUM CHLORIDE 0.9 % IV SOLN
297.5000 mg | Freq: Once | INTRAVENOUS | Status: AC
  Administered 2017-07-17: 300 mg via INTRAVENOUS
  Filled 2017-07-17: qty 30

## 2017-07-17 MED ORDER — DEXAMETHASONE SODIUM PHOSPHATE 10 MG/ML IJ SOLN
10.0000 mg | Freq: Once | INTRAMUSCULAR | Status: AC
  Administered 2017-07-17: 10 mg via INTRAVENOUS
  Filled 2017-07-17: qty 1

## 2017-07-17 MED ORDER — DIPHENHYDRAMINE HCL 50 MG/ML IJ SOLN
25.0000 mg | Freq: Once | INTRAMUSCULAR | Status: AC
  Administered 2017-07-17: 25 mg via INTRAVENOUS
  Filled 2017-07-17: qty 1

## 2017-08-07 ENCOUNTER — Inpatient Hospital Stay: Payer: Medicare Other

## 2017-08-07 ENCOUNTER — Inpatient Hospital Stay: Payer: Medicare Other | Attending: Oncology | Admitting: Oncology

## 2017-08-07 VITALS — BP 148/80 | HR 94 | Temp 97.4°F | Resp 18 | Wt 119.8 lb

## 2017-08-07 DIAGNOSIS — Z87891 Personal history of nicotine dependence: Secondary | ICD-10-CM | POA: Diagnosis not present

## 2017-08-07 DIAGNOSIS — R5383 Other fatigue: Secondary | ICD-10-CM | POA: Insufficient documentation

## 2017-08-07 DIAGNOSIS — J45909 Unspecified asthma, uncomplicated: Secondary | ICD-10-CM | POA: Insufficient documentation

## 2017-08-07 DIAGNOSIS — Z7689 Persons encountering health services in other specified circumstances: Secondary | ICD-10-CM | POA: Insufficient documentation

## 2017-08-07 DIAGNOSIS — Z90710 Acquired absence of both cervix and uterus: Secondary | ICD-10-CM | POA: Diagnosis not present

## 2017-08-07 DIAGNOSIS — C786 Secondary malignant neoplasm of retroperitoneum and peritoneum: Secondary | ICD-10-CM | POA: Diagnosis not present

## 2017-08-07 DIAGNOSIS — C774 Secondary and unspecified malignant neoplasm of inguinal and lower limb lymph nodes: Secondary | ICD-10-CM | POA: Diagnosis not present

## 2017-08-07 DIAGNOSIS — C801 Malignant (primary) neoplasm, unspecified: Secondary | ICD-10-CM | POA: Insufficient documentation

## 2017-08-07 DIAGNOSIS — Z5111 Encounter for antineoplastic chemotherapy: Secondary | ICD-10-CM | POA: Diagnosis not present

## 2017-08-07 DIAGNOSIS — R5381 Other malaise: Secondary | ICD-10-CM

## 2017-08-07 DIAGNOSIS — I10 Essential (primary) hypertension: Secondary | ICD-10-CM

## 2017-08-07 DIAGNOSIS — R531 Weakness: Secondary | ICD-10-CM | POA: Diagnosis not present

## 2017-08-07 DIAGNOSIS — Z79899 Other long term (current) drug therapy: Secondary | ICD-10-CM | POA: Diagnosis not present

## 2017-08-07 DIAGNOSIS — Z7982 Long term (current) use of aspirin: Secondary | ICD-10-CM | POA: Diagnosis not present

## 2017-08-07 DIAGNOSIS — M810 Age-related osteoporosis without current pathological fracture: Secondary | ICD-10-CM | POA: Diagnosis not present

## 2017-08-07 LAB — COMPREHENSIVE METABOLIC PANEL
ALK PHOS: 56 U/L (ref 38–126)
ALT: 14 U/L (ref 14–54)
ANION GAP: 7 (ref 5–15)
AST: 20 U/L (ref 15–41)
Albumin: 4.1 g/dL (ref 3.5–5.0)
BUN: 18 mg/dL (ref 6–20)
CALCIUM: 9.4 mg/dL (ref 8.9–10.3)
CO2: 28 mmol/L (ref 22–32)
Chloride: 103 mmol/L (ref 101–111)
Creatinine, Ser: 0.65 mg/dL (ref 0.44–1.00)
GFR calc non Af Amer: >60 mL/min (ref >60)
GLUCOSE: 74 mg/dL (ref 65–99)
POTASSIUM: 3.8 mmol/L (ref 3.5–5.1)
Sodium: 138 mmol/L (ref 135–145)
TOTAL PROTEIN: 6.8 g/dL (ref 6.5–8.1)
Total Bilirubin: 1.1 mg/dL (ref 0.3–1.2)

## 2017-08-07 LAB — CBC WITH DIFFERENTIAL/PLATELET
Basophils Absolute: 0 10*3/uL (ref 0–0.1)
Basophils Relative: 1 %
Eosinophils Absolute: 0 10*3/uL (ref 0–0.7)
Eosinophils Relative: 0 %
HEMATOCRIT: 35.4 % (ref 35.0–47.0)
HEMOGLOBIN: 12.3 g/dL (ref 12.0–16.0)
Lymphocytes Relative: 22 %
Lymphs Abs: 0.8 10*3/uL — ABNORMAL LOW (ref 1.0–3.6)
MCH: 31.5 pg (ref 26.0–34.0)
MCHC: 34.7 g/dL (ref 32.0–36.0)
MCV: 90.7 fL (ref 80.0–100.0)
MONO ABS: 0.6 10*3/uL (ref 0.2–0.9)
MONOS PCT: 15 %
NEUTROS ABS: 2.3 10*3/uL (ref 1.4–6.5)
NEUTROS PCT: 62 %
Platelets: 262 10*3/uL (ref 150–440)
RBC: 3.91 MIL/uL (ref 3.80–5.20)
RDW: 13.7 % (ref 11.5–14.5)
WBC: 3.7 10*3/uL (ref 3.6–11.0)

## 2017-08-07 MED ORDER — DIPHENHYDRAMINE HCL 50 MG/ML IJ SOLN
25.0000 mg | Freq: Once | INTRAMUSCULAR | Status: AC
  Administered 2017-08-07: 25 mg via INTRAVENOUS
  Filled 2017-08-07: qty 1

## 2017-08-07 MED ORDER — PALONOSETRON HCL INJECTION 0.25 MG/5ML
0.2500 mg | Freq: Once | INTRAVENOUS | Status: AC
  Administered 2017-08-07: 0.25 mg via INTRAVENOUS
  Filled 2017-08-07: qty 5

## 2017-08-07 MED ORDER — SODIUM CHLORIDE 0.9 % IV SOLN
Freq: Once | INTRAVENOUS | Status: AC
  Administered 2017-08-07: 11:00:00 via INTRAVENOUS
  Filled 2017-08-07: qty 1000

## 2017-08-07 MED ORDER — FAMOTIDINE IN NACL 20-0.9 MG/50ML-% IV SOLN
20.0000 mg | Freq: Once | INTRAVENOUS | Status: AC
  Administered 2017-08-07: 20 mg via INTRAVENOUS
  Filled 2017-08-07: qty 50

## 2017-08-07 MED ORDER — SODIUM CHLORIDE 0.9 % IV SOLN
300.0000 mg | Freq: Once | INTRAVENOUS | Status: AC
  Administered 2017-08-07: 300 mg via INTRAVENOUS
  Filled 2017-08-07: qty 30

## 2017-08-07 MED ORDER — PACLITAXEL CHEMO INJECTION 300 MG/50ML
135.0000 mg/m2 | Freq: Once | INTRAVENOUS | Status: AC
  Administered 2017-08-07: 204 mg via INTRAVENOUS
  Filled 2017-08-07: qty 34

## 2017-08-07 MED ORDER — DEXAMETHASONE SODIUM PHOSPHATE 10 MG/ML IJ SOLN
10.0000 mg | Freq: Once | INTRAMUSCULAR | Status: AC
  Administered 2017-08-07: 10 mg via INTRAVENOUS
  Filled 2017-08-07: qty 1

## 2017-08-07 MED ORDER — PEGFILGRASTIM 6 MG/0.6ML ~~LOC~~ PSKT
6.0000 mg | PREFILLED_SYRINGE | Freq: Once | SUBCUTANEOUS | Status: AC
  Administered 2017-08-07: 6 mg via SUBCUTANEOUS
  Filled 2017-08-07: qty 0.6

## 2017-08-07 NOTE — Progress Notes (Signed)
[place]  [phone]) [phone] [phone]) [phone]  ID: Lisa Hayden OB: October 13, 1930  MR#: [id number]  NLZ#:[id number]  Patient Care Team: Birdie Sons, MD as PCP - General (Family Medicine) Clent Jacks, RN as Registered Nurse  CHIEF COMPLAINT: Peritoneal carcinomatosis.  INTERVAL HISTORY: Patient returns to clinic today for further evaluation and consideration of cycle  6 of 7 cycles of carboplatinum and Taxol. Patient recently had a positive biopsy of an inguinal lymph node prompting 3 additional treatments. She currently feels well and is asymptomatic. She complains of mild weakness and fatigue today. Dr. Grayland Ormond gave her the okay to began tai chi and weight lifting. She states this is making her feel better. She has no neurologic complaints. She denies any recent fevers. She has a good appetite and has maintained her weight. She has questions about alcohol consumption with her chemotherapy treatment. She would like to consume 5 oz of wine with dinner. She has no chest pain or shortness of breath. She denies any nausea, vomiting, constipation, or diarrhea. She has no abdominal pain. She has no urinary complaints. Patient offers no specific complaints today.  REVIEW OF SYSTEMS:   Review of Systems  Constitutional: Positive for malaise/fatigue. Negative for fever and weight loss.  Respiratory: Negative.  Negative for cough and shortness of breath.   Cardiovascular: Negative.  Negative for chest pain and leg swelling.  Gastrointestinal: Negative.  Negative for abdominal pain.  Genitourinary: Negative.   Musculoskeletal: Negative.   Skin: Negative.  Negative for rash.  Neurological: Positive for weakness. Negative for sensory change.  Psychiatric/Behavioral: Negative.  The patient is not nervous/anxious.    As per HPI. Otherwise, a complete review of systems is negative.  PAST MEDICAL HISTORY: Past Medical History:  Diagnosis Date  . Asthma   .  Cancer ([place])   . Hypertension   . Osteoporosis     PAST SURGICAL HISTORY: Past Surgical History:  Procedure Laterality Date  . 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 APPENDECTOMY  1953  . BREAST LUMPECTOMY Left 10/29/94   negative nodes. Treated with radiation only  . BREAST SURGERY    . BUNIONECTOMY  1979  . CATARACT EXTRACTION, BILATERAL     left 07/29/2012; right 06/24/2012  . COLON SURGERY  05/1991   secondary to cancer   . ROTATOR CUFF REPAIR Right 01/25/10  . TONSILLECTOMY  1938   and adenoidectomy    FAMILY HISTORY: Family History  Problem Relation Age of Onset  . CVA Mother   . Congestive Heart Failure Mother   . Diabetes Sister   . Asthma Father     ADVANCED DIRECTIVES (Y/N):  N  HEALTH MAINTENANCE: Social History  Substance Use Topics  . Smoking status: Former Research scientist (life sciences)  . Smokeless tobacco: Never Used  . Alcohol use 4.2 oz/week    7 Standard drinks or equivalent per week     Comment: glass or beer at dinner     Colonoscopy:  PAP:  Bone density:  Lipid panel:  Allergies  Allergen Reactions  . Actonel [Risedronate Sodium] Other (See Comments)    headache  . Cortisone     very alert and did not sleep for 24 hrs  . Daypro  [Oxaprozin]   . Meloxicam     Current Outpatient Prescriptions  Medication Sig Dispense Refill  . alendronate (FOSAMAX) 70 MG tablet Take 1 tablet (70 mg total) by mouth once a week. On Sunday 12  tablet 4  . aspirin 81 MG chewable tablet Chew 81 mg by mouth daily.    . cholecalciferol (VITAMIN D) 1000 UNITS tablet Take 2,000 Units by mouth every evening. With dinner     . polyethylene glycol (MIRALAX / GLYCOLAX) packet Take 17 g by mouth as needed.    . ondansetron (ZOFRAN) 8 MG tablet Take 1 tablet (8 mg total) by mouth every 8 (eight) hours as needed for nausea or vomiting. (Patient not taking: Reported on 06/26/2017) 60 tablet 0  . prochlorperazine (COMPAZINE) 10 MG tablet Take 1  tablet (10 mg total) by mouth every 6 (six) hours as needed for nausea or vomiting. (Patient not taking: Reported on 06/26/2017) 60 tablet 0   No current facility-administered medications for this visit.    Facility-Administered Medications Ordered in Other Visits  Medication Dose Route Frequency Provider Last Rate Last Dose  . CARBOplatin (PARAPLATIN) 300 mg in sodium chloride 0.9 % 250 mL chemo infusion  300 mg Intravenous Once Lloyd Huger, MD      . PACLitaxel (TAXOL) 204 mg in dextrose 5 % 250 mL chemo infusion (> 49m/m2)  135 mg/m2 (Treatment Plan Recorded) Intravenous Once FLloyd Huger MD 95 mL/hr at 08/07/17 1133 204 mg at 08/07/17 1133  . pegfilgrastim (NEULASTA ONPRO KIT) injection 6 mg  6 mg Subcutaneous Once FLloyd Huger MD        OBJECTIVE: Vitals:   08/07/17 0911  BP: (!) 148/80  Pulse: 94  Resp: 18  Temp: (!) 97.4 F (36.3 C)     Body mass index is 21.91 kg/m.    ECOG FS:0 - Asymptomatic  General: Well-developed, well-nourished, no acute distress. Eyes: Pink conjunctiva, anicteric sclera. Lungs: Clear to auscultation bilaterally. Heart: Regular rate and rhythm. No rubs, murmurs, or gallops. Abdomen: Soft, nontender, nondistended. No organomegaly noted, normoactive bowel sounds. Musculoskeletal: No edema, cyanosis, or clubbing. Neuro: Alert, answering all questions appropriately. Cranial nerves grossly intact. Skin: No rashes or petechiae noted. Psych: Normal affect.   LAB RESULTS:  Lab Results  Component Value Date   NA 138 08/07/2017   K 3.8 08/07/2017   CL 103 08/07/2017   CO2 28 08/07/2017   GLUCOSE 74 08/07/2017   BUN 18 08/07/2017   CREATININE 0.65 08/07/2017   CALCIUM 9.4 08/07/2017   PROT 6.8 08/07/2017   ALBUMIN 4.1 08/07/2017   AST 20 08/07/2017   ALT 14 08/07/2017   ALKPHOS 56 08/07/2017   BILITOT 1.1 08/07/2017   GFRNONAA >60 08/07/2017   GFRAA >60 08/07/2017    Lab Results  Component Value Date   WBC 3.7  08/07/2017   NEUTROABS 2.3 08/07/2017   HGB 12.3 08/07/2017   HCT 35.4 08/07/2017   MCV 90.7 08/07/2017   PLT 262 08/07/2017   Lab Results  Component Value Date   CA125 22.3 05/08/2017     STUDIES: No results found.  ASSESSMENT: Peritoneal carcinomatosis  PLAN:    1. Peritoneal carcinomatosis: Patient completed 4 cycles of carboplatinum and Taxol on May 08, 2017. Restaging CT scan on June 13, 2017 reviewed independently and reported as above with significant improvement of patient's disease burden, although she had a left inguinal lymph node that revealed persistent disease. Proceed with cycle 6 of 7 of carboplatinum and Taxol with Neulasta support. Return to clinic in 3 weeks for consideration of cycle 7. Continue follow-up with gynecology oncology as scheduled. 2. Leukopenia: Resolved. Neulasta as above. 3. Hypokalemia: Resolved. 4. Elevated bilirubin: Resolved.   Approximately 30  minutes was spent in discussion, which greater than 50% was consultation.  Patient expressed understanding and was in agreement with this plan. She also understands that She can call clinic at any time with any questions, concerns, or complaints.   Cancer Staging Peritoneal carcinomatosis [facility]) Staging form: Soft Tissue Sarcoma of the Abdomen and Thoracic Visceral Organs, AJCC 8th Edition - Clinical stage from 02/13/2017: cT4c, cN1, cM0 - Signed by Lloyd Huger, MD on 02/13/2017   Jacquelin Hawking, NP   08/07/2017 1:04 PM

## 2017-08-25 NOTE — Progress Notes (Signed)
[place]  [phone]) [phone] Fax:(336) [phone]  ID: Lisa Hayden OB: [birth year]  MR#: [id number]  QQV#:[id number]  Patient Care Team: Birdie Sons, MD as PCP - General (Family Medicine) Clent Jacks, RN as Registered Nurse  CHIEF COMPLAINT: Peritoneal carcinomatosis.  INTERVAL HISTORY: Patient returns to clinic today for further evaluation and consideration of cycle 7 of carboplatinum and Taxol. Patient recently had a positive biopsy of an inguinal lymph node prompting 3 additional treatments. She continues to feel well and is asymptomatic. She does not complain of weakness or fatigue today. She has no neurologic complaints. She denies any recent fevers. She has a good appetite and has maintained her weight. She has no chest pain or shortness of breath. She denies any nausea, vomiting, constipation, or diarrhea. She has no abdominal pain. She has no urinary complaints. Patient offers no specific complaints today.  REVIEW OF SYSTEMS:   Review of Systems  Constitutional: Negative for fever, malaise/fatigue and weight loss.  Respiratory: Negative.  Negative for cough and shortness of breath.   Cardiovascular: Negative.  Negative for chest pain and leg swelling.  Gastrointestinal: Negative.  Negative for abdominal pain.  Genitourinary: Negative.   Musculoskeletal: Negative.   Skin: Negative.  Negative for rash.  Neurological: Negative for sensory change and weakness.  Psychiatric/Behavioral: Negative.  The patient is not nervous/anxious.    As per HPI. Otherwise, a complete review of systems is negative.  PAST MEDICAL HISTORY: Past Medical History:  Diagnosis Date  . Asthma   . Cancer ([place])   . Hypertension   . Osteoporosis     PAST SURGICAL HISTORY: Past Surgical History:  Procedure Laterality Date  . 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 APPENDECTOMY  1953  . BREAST LUMPECTOMY Left  10/29/94   negative nodes. Treated with radiation only  . BREAST SURGERY    . BUNIONECTOMY  1979  . CATARACT EXTRACTION, BILATERAL     left 07/29/2012; right 06/24/2012  . COLON SURGERY  05/1991   secondary to cancer   . ROTATOR CUFF REPAIR Right 01/25/10  . TONSILLECTOMY  1938   and adenoidectomy    FAMILY HISTORY: Family History  Problem Relation Age of Onset  . CVA Mother   . Congestive Heart Failure Mother   . Diabetes Sister   . Asthma Father     ADVANCED DIRECTIVES (Y/N):  N  HEALTH MAINTENANCE: Social History  Substance Use Topics  . Smoking status: Former Research scientist (life sciences)  . Smokeless tobacco: Never Used  . Alcohol use 4.2 oz/week    7 Standard drinks or equivalent per week     Comment: glass or beer at dinner     Colonoscopy:  PAP:  Bone density:  Lipid panel:  Allergies  Allergen Reactions  . Actonel [Risedronate Sodium] Other (See Comments)    headache  . Cortisone     very alert and did not sleep for 24 hrs  . Daypro  [Oxaprozin]   . Meloxicam     Current Outpatient Prescriptions  Medication Sig Dispense Refill  . alendronate (FOSAMAX) 70 MG tablet Take 1 tablet (70 mg total) by mouth once a week. On Sunday 12 tablet 4  . aspirin 81 MG chewable tablet Chew 81 mg by mouth daily.    . cholecalciferol (VITAMIN D) 1000 UNITS tablet Take 2,000 Units by mouth every evening. With dinner     . ondansetron (ZOFRAN) 8 MG tablet Take 1 tablet (  8 mg total) by mouth every 8 (eight) hours as needed for nausea or vomiting. (Patient not taking: Reported on 06/26/2017) 60 tablet 0  . polyethylene glycol (MIRALAX / GLYCOLAX) packet Take 17 g by mouth as needed.    . prochlorperazine (COMPAZINE) 10 MG tablet Take 1 tablet (10 mg total) by mouth every 6 (six) hours as needed for nausea or vomiting. (Patient not taking: Reported on 06/26/2017) 60 tablet 0   No current facility-administered medications for this visit.     OBJECTIVE: Vitals:   08/28/17 0856  BP: (!) 152/79  Pulse:  83  Resp: 18  Temp: 97.8 F (36.6 C)     Body mass index is 21.66 kg/m.    ECOG FS:0 - Asymptomatic  General: Well-developed, well-nourished, no acute distress. Eyes: Pink conjunctiva, anicteric sclera. Lungs: Clear to auscultation bilaterally. Heart: Regular rate and rhythm. No rubs, murmurs, or gallops. Abdomen: Soft, nontender, nondistended. No organomegaly noted, normoactive bowel sounds. Musculoskeletal: No edema, cyanosis, or clubbing. Neuro: Alert, answering all questions appropriately. Cranial nerves grossly intact. Skin: No rashes or petechiae noted. Psych: Normal affect.   LAB RESULTS:  Lab Results  Component Value Date   NA 140 08/28/2017   K 4.0 08/28/2017   CL 105 08/28/2017   CO2 27 08/28/2017   GLUCOSE 87 08/28/2017   BUN 19 08/28/2017   CREATININE 0.62 08/28/2017   CALCIUM 9.4 08/28/2017   PROT 6.8 08/28/2017   ALBUMIN 4.0 08/28/2017   AST 17 08/28/2017   ALT 13 (L) 08/28/2017   ALKPHOS 70 08/28/2017   BILITOT 1.0 08/28/2017   GFRNONAA >60 08/28/2017   GFRAA >60 08/28/2017    Lab Results  Component Value Date   WBC 3.5 (L) 08/28/2017   NEUTROABS 2.1 08/28/2017   HGB 12.6 08/28/2017   HCT 37.5 08/28/2017   MCV 92.9 08/28/2017   PLT 220 08/28/2017   Lab Results  Component Value Date   CA125 22.3 05/08/2017     STUDIES: No results found.  ASSESSMENT: Peritoneal carcinomatosis  PLAN:    1. Peritoneal carcinomatosis: Patient completed 4 cycles of carboplatinum and Taxol on May 08, 2017. Restaging CT scan on June 13, 2017 reviewed independently with significant improvement of patient's disease burden, although she had a left inguinal lymph node that revealed persistent disease. Because of this, we will proceed with cycle 7 of 7 of carboplatinum and Taxol with Neulasta support. Return to clinic in 6 weeksWith repeat imaging and further evaluation. Patient will also have evaluation with Gynecology-oncology that day as well. 2. Leukopenia: Mild,  monitor. Neulasta as above. 3. Hypokalemia: Resolved. 4. Elevated bilirubin: Resolved.   Approximately 30 minutes was spent in discussion, which greater than 50% was consultation.  Patient expressed understanding and was in agreement with this plan. She also understands that She can call clinic at any time with any questions, concerns, or complaints.   Cancer Staging Peritoneal carcinomatosis [facility]) Staging form: Soft Tissue Sarcoma of the Abdomen and Thoracic Visceral Organs, AJCC 8th Edition - Clinical stage from 02/13/2017: cT4c, cN1, cM0 - Signed by Lloyd Huger, MD on 02/13/2017   Lloyd Huger, MD   08/30/2017 4:42 PM

## 2017-08-27 ENCOUNTER — Other Ambulatory Visit: Payer: Self-pay | Admitting: Oncology

## 2017-08-28 ENCOUNTER — Inpatient Hospital Stay (HOSPITAL_BASED_OUTPATIENT_CLINIC_OR_DEPARTMENT_OTHER): Payer: Medicare Other | Admitting: Oncology

## 2017-08-28 ENCOUNTER — Inpatient Hospital Stay: Payer: Medicare Other | Attending: Oncology

## 2017-08-28 ENCOUNTER — Inpatient Hospital Stay: Payer: Medicare Other

## 2017-08-28 VITALS — BP 152/79 | HR 83 | Temp 97.8°F | Resp 18 | Wt 118.4 lb

## 2017-08-28 DIAGNOSIS — C774 Secondary and unspecified malignant neoplasm of inguinal and lower limb lymph nodes: Secondary | ICD-10-CM

## 2017-08-28 DIAGNOSIS — Z85038 Personal history of other malignant neoplasm of large intestine: Secondary | ICD-10-CM | POA: Insufficient documentation

## 2017-08-28 DIAGNOSIS — Z79899 Other long term (current) drug therapy: Secondary | ICD-10-CM

## 2017-08-28 DIAGNOSIS — Z7982 Long term (current) use of aspirin: Secondary | ICD-10-CM | POA: Diagnosis not present

## 2017-08-28 DIAGNOSIS — Z87891 Personal history of nicotine dependence: Secondary | ICD-10-CM

## 2017-08-28 DIAGNOSIS — T451X5S Adverse effect of antineoplastic and immunosuppressive drugs, sequela: Secondary | ICD-10-CM | POA: Insufficient documentation

## 2017-08-28 DIAGNOSIS — D72819 Decreased white blood cell count, unspecified: Secondary | ICD-10-CM | POA: Diagnosis not present

## 2017-08-28 DIAGNOSIS — C786 Secondary malignant neoplasm of retroperitoneum and peritoneum: Secondary | ICD-10-CM | POA: Diagnosis not present

## 2017-08-28 DIAGNOSIS — C801 Malignant (primary) neoplasm, unspecified: Secondary | ICD-10-CM | POA: Diagnosis not present

## 2017-08-28 DIAGNOSIS — I10 Essential (primary) hypertension: Secondary | ICD-10-CM | POA: Insufficient documentation

## 2017-08-28 DIAGNOSIS — Z90710 Acquired absence of both cervix and uterus: Secondary | ICD-10-CM | POA: Diagnosis not present

## 2017-08-28 DIAGNOSIS — Z5111 Encounter for antineoplastic chemotherapy: Secondary | ICD-10-CM | POA: Insufficient documentation

## 2017-08-28 DIAGNOSIS — J45909 Unspecified asthma, uncomplicated: Secondary | ICD-10-CM | POA: Insufficient documentation

## 2017-08-28 LAB — CBC WITH DIFFERENTIAL/PLATELET
Basophils Absolute: 0 K/uL (ref 0–0.1)
Basophils Relative: 1 %
Eosinophils Absolute: 0 K/uL (ref 0–0.7)
Eosinophils Relative: 1 %
HCT: 37.5 % (ref 35.0–47.0)
Hemoglobin: 12.6 g/dL (ref 12.0–16.0)
Lymphocytes Relative: 24 %
Lymphs Abs: 0.8 K/uL — ABNORMAL LOW (ref 1.0–3.6)
MCH: 31.3 pg (ref 26.0–34.0)
MCHC: 33.7 g/dL (ref 32.0–36.0)
MCV: 92.9 fL (ref 80.0–100.0)
Monocytes Absolute: 0.4 K/uL (ref 0.2–0.9)
Monocytes Relative: 13 %
Neutro Abs: 2.1 K/uL (ref 1.4–6.5)
Neutrophils Relative %: 61 %
Platelets: 220 K/uL (ref 150–440)
RBC: 4.04 MIL/uL (ref 3.80–5.20)
RDW: 14.9 % — ABNORMAL HIGH (ref 11.5–14.5)
WBC: 3.5 K/uL — ABNORMAL LOW (ref 3.6–11.0)

## 2017-08-28 LAB — COMPREHENSIVE METABOLIC PANEL
ALK PHOS: 70 U/L (ref 38–126)
ALT: 13 U/L — ABNORMAL LOW (ref 14–54)
ANION GAP: 8 (ref 5–15)
AST: 17 U/L (ref 15–41)
Albumin: 4 g/dL (ref 3.5–5.0)
BILIRUBIN TOTAL: 1 mg/dL (ref 0.3–1.2)
BUN: 19 mg/dL (ref 6–20)
CALCIUM: 9.4 mg/dL (ref 8.9–10.3)
CO2: 27 mmol/L (ref 22–32)
Chloride: 105 mmol/L (ref 101–111)
Creatinine, Ser: 0.62 mg/dL (ref 0.44–1.00)
GFR calc Af Amer: >60 mL/min (ref >60)
GLUCOSE: 87 mg/dL (ref 65–99)
POTASSIUM: 4 mmol/L (ref 3.5–5.1)
Sodium: 140 mmol/L (ref 135–145)
TOTAL PROTEIN: 6.8 g/dL (ref 6.5–8.1)

## 2017-08-28 MED ORDER — SODIUM CHLORIDE 0.9 % IV SOLN
Freq: Once | INTRAVENOUS | Status: AC
  Administered 2017-08-28: 10:00:00 via INTRAVENOUS
  Filled 2017-08-28: qty 1000

## 2017-08-28 MED ORDER — PEGFILGRASTIM 6 MG/0.6ML ~~LOC~~ PSKT
6.0000 mg | PREFILLED_SYRINGE | Freq: Once | SUBCUTANEOUS | Status: AC
  Administered 2017-08-28: 6 mg via SUBCUTANEOUS
  Filled 2017-08-28: qty 0.6

## 2017-08-28 MED ORDER — FAMOTIDINE IN NACL 20-0.9 MG/50ML-% IV SOLN
20.0000 mg | Freq: Once | INTRAVENOUS | Status: AC
  Administered 2017-08-28: 20 mg via INTRAVENOUS
  Filled 2017-08-28: qty 50

## 2017-08-28 MED ORDER — PACLITAXEL CHEMO INJECTION 300 MG/50ML
135.0000 mg/m2 | Freq: Once | INTRAVENOUS | Status: AC
  Administered 2017-08-28: 204 mg via INTRAVENOUS
  Filled 2017-08-28: qty 34

## 2017-08-28 MED ORDER — PALONOSETRON HCL INJECTION 0.25 MG/5ML
0.2500 mg | Freq: Once | INTRAVENOUS | Status: AC
  Administered 2017-08-28: 0.25 mg via INTRAVENOUS
  Filled 2017-08-28: qty 5

## 2017-08-28 MED ORDER — SODIUM CHLORIDE 0.9 % IV SOLN
300.0000 mg | Freq: Once | INTRAVENOUS | Status: AC
  Administered 2017-08-28: 300 mg via INTRAVENOUS
  Filled 2017-08-28: qty 30

## 2017-08-28 MED ORDER — DIPHENHYDRAMINE HCL 50 MG/ML IJ SOLN
25.0000 mg | Freq: Once | INTRAMUSCULAR | Status: AC
  Administered 2017-08-28: 25 mg via INTRAVENOUS
  Filled 2017-08-28: qty 1

## 2017-08-28 MED ORDER — HEPARIN SOD (PORK) LOCK FLUSH 100 UNIT/ML IV SOLN
INTRAVENOUS | Status: AC
  Filled 2017-08-28: qty 5

## 2017-08-28 MED ORDER — DEXAMETHASONE SODIUM PHOSPHATE 10 MG/ML IJ SOLN
10.0000 mg | Freq: Once | INTRAMUSCULAR | Status: AC
  Administered 2017-08-28: 10 mg via INTRAVENOUS
  Filled 2017-08-28: qty 1

## 2017-08-28 NOTE — Progress Notes (Signed)
Here for follow up

## 2017-09-25 ENCOUNTER — Ambulatory Visit: Payer: Medicare Other

## 2017-09-29 NOTE — Progress Notes (Signed)
[place]  [phone] [phone]  ID: Lisa Hayden OB: Mar 21, 1930  MR#: [id number]  IDP#:[id number]  Patient Care Team: Birdie Sons, MD as PCP - General (Family Medicine) Clent Jacks, RN as Registered Nurse  CHIEF COMPLAINT: Peritoneal carcinomatosis.  INTERVAL HISTORY: Patient returns to clinic today for further evaluation and discussion of her imaging results.  She currently feels well and is asymptomatic. She does not complain of weakness or fatigue today. She has no neurologic complaints. She denies any recent fevers. She has a good appetite and has maintained her weight. She has no chest pain or shortness of breath. She denies any nausea, vomiting, constipation, or diarrhea. She has no abdominal pain. She has no urinary complaints. Patient offers no specific complaints today.  REVIEW OF SYSTEMS:   Review of Systems  Constitutional: Negative for fever, malaise/fatigue and weight loss.  Respiratory: Negative.  Negative for cough and shortness of breath.   Cardiovascular: Negative.  Negative for chest pain and leg swelling.  Gastrointestinal: Negative.  Negative for abdominal pain.  Genitourinary: Negative.   Musculoskeletal: Negative.   Skin: Negative.  Negative for rash.  Neurological: Negative for sensory change and weakness.  Psychiatric/Behavioral: Negative.  The patient is not nervous/anxious.    As per HPI. Otherwise, a complete review of systems is negative.  PAST MEDICAL HISTORY: Past Medical History:  Diagnosis Date  . Cancer [facility])    peritoneal carcinoma  . Hypertension   . Osteoporosis     PAST SURGICAL HISTORY: Past Surgical History:  Procedure Laterality Date  . 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 APPENDECTOMY  1953  . BREAST LUMPECTOMY Left 10/29/94   negative nodes. Treated with radiation only  . BREAST SURGERY    . BUNIONECTOMY  1979  .  CATARACT EXTRACTION, BILATERAL     left 07/29/2012; right 06/24/2012  . COLON SURGERY  05/1991   secondary to cancer   . ROTATOR CUFF REPAIR Right 01/25/10  . TONSILLECTOMY  1938   and adenoidectomy    FAMILY HISTORY: Family History  Problem Relation Age of Onset  . CVA Mother   . Congestive Heart Failure Mother   . Diabetes Sister   . Asthma Father     ADVANCED DIRECTIVES (Y/N):  N  HEALTH MAINTENANCE: Social History   Tobacco Use  . Smoking status: Former Research scientist (life sciences)  . Smokeless tobacco: Never Used  Substance Use Topics  . Alcohol use: Yes    Alcohol/week: 4.2 oz    Types: 7 Standard drinks or equivalent per week    Comment: glass or beer at dinner  . Drug use: No     Colonoscopy:  PAP:  Bone density:  Lipid panel:  Allergies  Allergen Reactions  . Actonel [Risedronate Sodium] Other (See Comments)    headache  . Cortisone     very alert and did not sleep for 24 hrs  . Daypro  [Oxaprozin]   . Meloxicam     Current Outpatient Medications  Medication Sig Dispense Refill  . alendronate (FOSAMAX) 70 MG tablet Take 1 tablet (70 mg total) by mouth once a week. On Sunday 12 tablet 4  . aspirin 81 MG chewable tablet Chew 81 mg by mouth daily.    . cholecalciferol (VITAMIN D) 1000 UNITS tablet Take 2,000 Units by mouth every evening. With dinner     . ondansetron (ZOFRAN) 8 MG tablet Take 1 tablet (8 mg total) by  mouth every 8 (eight) hours as needed for nausea or vomiting. (Patient not taking: Reported on 06/26/2017) 60 tablet 0  . polyethylene glycol (MIRALAX / GLYCOLAX) packet Take 17 g by mouth as needed.    . prochlorperazine (COMPAZINE) 10 MG tablet Take 1 tablet (10 mg total) by mouth every 6 (six) hours as needed for nausea or vomiting. (Patient not taking: Reported on 06/26/2017) 60 tablet 0   No current facility-administered medications for this visit.     OBJECTIVE: There were no vitals filed for this visit.   There is no height or weight on file to calculate BMI.     ECOG FS:0 - Asymptomatic  General: Well-developed, well-nourished, no acute distress. Eyes: Pink conjunctiva, anicteric sclera. Lungs: Clear to auscultation bilaterally. Heart: Regular rate and rhythm. No rubs, murmurs, or gallops. Abdomen: Soft, nontender, nondistended. No organomegaly noted, normoactive bowel sounds. Musculoskeletal: No edema, cyanosis, or clubbing. Neuro: Alert, answering all questions appropriately. Cranial nerves grossly intact. Skin: No rashes or petechiae noted. Psych: Normal affect.   LAB RESULTS:  Lab Results  Component Value Date   NA 138 09/30/2017   K 3.5 09/30/2017   CL 103 09/30/2017   CO2 25 09/30/2017   GLUCOSE 101 (H) 09/30/2017   BUN 12 09/30/2017   CREATININE 0.55 09/30/2017   CALCIUM 9.5 09/30/2017   PROT 7.2 09/30/2017   ALBUMIN 4.3 09/30/2017   AST 18 09/30/2017   ALT 13 (L) 09/30/2017   ALKPHOS 57 09/30/2017   BILITOT 1.3 (H) 09/30/2017   GFRNONAA >60 09/30/2017   GFRAA >60 09/30/2017    Lab Results  Component Value Date   WBC 2.6 (L) 09/30/2017   NEUTROABS 1.6 09/30/2017   HGB 12.3 09/30/2017   HCT 36.3 09/30/2017   MCV 96.1 09/30/2017   PLT 236 09/30/2017   Lab Results  Component Value Date   CA125 22.3 05/08/2017     STUDIES: Ct Abdomen Pelvis W Contrast  Result Date: 09/30/2017 CLINICAL DATA:  Abnormal inguinal lymph node. Peritoneal carcinomatosis. EXAM: CT ABDOMEN AND PELVIS WITH CONTRAST TECHNIQUE: Multidetector CT imaging of the abdomen and pelvis was performed using the standard protocol following bolus administration of intravenous contrast. CONTRAST:  75 cc Isovue-300. COMPARISON:  06/13/2017. FINDINGS: Lower chest: Lung bases are clear. Heart is at the upper limits of normal in size. No pericardial pleural effusion. Distal esophagus is grossly unremarkable. Distal periesophageal/retrocrural lymph nodes measure up to 10 mm, similar. Hepatobiliary: There are multiple new hyperattenuating lesions in the liver.  Largest lesion measures 11 mm in the inferior right hepatic lobe. Stones are seen in the gallbladder. No biliary ductal dilatation. Pancreas: Negative. Spleen: Negative. Adrenals/Urinary Tract: Adrenal glands and kidneys are unremarkable. Ureters are decompressed. Bladder is grossly unremarkable. Stomach/Bowel: Stomach, small bowel and colon are unremarkable. Vascular/Lymphatic: Atherosclerotic calcification of the arterial vasculature without abdominal aortic aneurysm. Abdominal retroperitoneal lymph nodes measure up to 10 mm in the left periaortic station, as before. Previously seen pathologically enlarged left inguinal lymph node is no longer visualized. Reproductive: Hysterectomy.  No adnexal mass. Other: No free fluid. Mesenteries and peritoneum are otherwise unremarkable. Musculoskeletal: No worrisome lytic or sclerotic lesions. Degenerative changes are seen in the spine. IMPRESSION: 1. Several new hypodense liver lesions, worrisome for metastatic disease. 2. Left inguinal lymph node is no longer visualized. Borderline enlarged distal periesophageal/retrocrural and abdominal retroperitoneal lymph nodes are stable. No evidence of recurrent peritoneal carcinomatosis. 3. Cholelithiasis. 4.  Aortic atherosclerosis (ICD10-170.0). Electronically Signed   By: Lorin Picket M.D.  On: 09/30/2017 12:24    ASSESSMENT: Peritoneal carcinomatosis  PLAN:    1. Peritoneal carcinomatosis: Patient completed 4 cycles of carboplatinum and Taxol on May 08, 2017. Restaging CT scan on June 13, 2017 reviewed independently with significant improvement of patient's disease burden, although she had a left inguinal lymph node that revealed persistent disease.  She then received 3 additional cycles of carboplatinum and Taxol completing cycle 7 on August 28, 2017.  Restaging CT scan on September 30, 2017 reported as above with new hypodense liver lesions, but her left inguinal lymph node is no longer visualized.  After lengthy  discussion with the patient as well as consultation with gynecology oncology, it was agreed upon not pursue additional chemotherapy at this time and repeat imaging in 3 months.  Patient expressed understanding that she may need additional chemotherapy in the future.  She has also been instructed to keep her follow-up with gynecology oncology as scheduled. 2. Leukopenia: Mild, monitor.  3. Hypokalemia: Resolved. 4. Elevated bilirubin: Resolved.   Approximately 30 minutes was spent in discussion, which greater than 50% was consultation.  Patient expressed understanding and was in agreement with this plan. She also understands that She can call clinic at any time with any questions, concerns, or complaints.   Cancer Staging Peritoneal carcinomatosis [facility]) Staging form: Soft Tissue Sarcoma of the Abdomen and Thoracic Visceral Organs, AJCC 8th Edition - Clinical stage from 02/13/2017: cT4c, cN1, cM0 - Signed by Lloyd Huger, MD on 02/13/2017   Lloyd Huger, MD   10/02/2017 3:13 PM

## 2017-09-30 ENCOUNTER — Inpatient Hospital Stay: Payer: Medicare Other | Attending: Oncology

## 2017-09-30 ENCOUNTER — Ambulatory Visit
Admission: RE | Admit: 2017-09-30 | Discharge: 2017-09-30 | Disposition: A | Discharge status: Home / Self Care | Payer: Medicare Other | Source: Ambulatory Visit | Attending: Oncology | Admitting: Oncology

## 2017-09-30 DIAGNOSIS — I700 Atherosclerosis of aorta: Secondary | ICD-10-CM | POA: Insufficient documentation

## 2017-09-30 DIAGNOSIS — R591 Generalized enlarged lymph nodes: Secondary | ICD-10-CM | POA: Insufficient documentation

## 2017-09-30 DIAGNOSIS — R54 Age-related physical debility: Secondary | ICD-10-CM | POA: Insufficient documentation

## 2017-09-30 DIAGNOSIS — Z7982 Long term (current) use of aspirin: Secondary | ICD-10-CM | POA: Insufficient documentation

## 2017-09-30 DIAGNOSIS — I10 Essential (primary) hypertension: Secondary | ICD-10-CM | POA: Insufficient documentation

## 2017-09-30 DIAGNOSIS — Z90710 Acquired absence of both cervix and uterus: Secondary | ICD-10-CM | POA: Insufficient documentation

## 2017-09-30 DIAGNOSIS — E43 Unspecified severe protein-calorie malnutrition: Secondary | ICD-10-CM | POA: Diagnosis not present

## 2017-09-30 DIAGNOSIS — Z79899 Other long term (current) drug therapy: Secondary | ICD-10-CM | POA: Insufficient documentation

## 2017-09-30 DIAGNOSIS — Z87891 Personal history of nicotine dependence: Secondary | ICD-10-CM | POA: Insufficient documentation

## 2017-09-30 DIAGNOSIS — C786 Secondary malignant neoplasm of retroperitoneum and peritoneum: Secondary | ICD-10-CM | POA: Insufficient documentation

## 2017-09-30 DIAGNOSIS — K769 Liver disease, unspecified: Secondary | ICD-10-CM | POA: Insufficient documentation

## 2017-09-30 DIAGNOSIS — K8020 Calculus of gallbladder without cholecystitis without obstruction: Secondary | ICD-10-CM | POA: Insufficient documentation

## 2017-09-30 DIAGNOSIS — C5702 Malignant neoplasm of left fallopian tube: Secondary | ICD-10-CM | POA: Diagnosis not present

## 2017-09-30 DIAGNOSIS — J45909 Unspecified asthma, uncomplicated: Secondary | ICD-10-CM | POA: Diagnosis not present

## 2017-09-30 DIAGNOSIS — C801 Malignant (primary) neoplasm, unspecified: Secondary | ICD-10-CM | POA: Insufficient documentation

## 2017-09-30 DIAGNOSIS — C774 Secondary and unspecified malignant neoplasm of inguinal and lower limb lymph nodes: Secondary | ICD-10-CM | POA: Diagnosis not present

## 2017-09-30 DIAGNOSIS — Z9221 Personal history of antineoplastic chemotherapy: Secondary | ICD-10-CM | POA: Insufficient documentation

## 2017-09-30 DIAGNOSIS — D72819 Decreased white blood cell count, unspecified: Secondary | ICD-10-CM | POA: Diagnosis not present

## 2017-09-30 DIAGNOSIS — Z90722 Acquired absence of ovaries, bilateral: Secondary | ICD-10-CM | POA: Diagnosis not present

## 2017-09-30 LAB — COMPREHENSIVE METABOLIC PANEL
ALK PHOS: 57 U/L (ref 38–126)
ALT: 13 U/L — AB (ref 14–54)
ANION GAP: 10 (ref 5–15)
AST: 18 U/L (ref 15–41)
Albumin: 4.3 g/dL (ref 3.5–5.0)
BUN: 12 mg/dL (ref 6–20)
CO2: 25 mmol/L (ref 22–32)
Calcium: 9.5 mg/dL (ref 8.9–10.3)
Chloride: 103 mmol/L (ref 101–111)
Creatinine, Ser: 0.55 mg/dL (ref 0.44–1.00)
Glucose, Bld: 101 mg/dL — ABNORMAL HIGH (ref 65–99)
Potassium: 3.5 mmol/L (ref 3.5–5.1)
SODIUM: 138 mmol/L (ref 135–145)
TOTAL PROTEIN: 7.2 g/dL (ref 6.5–8.1)
Total Bilirubin: 1.3 mg/dL — ABNORMAL HIGH (ref 0.3–1.2)

## 2017-09-30 LAB — CBC WITH DIFFERENTIAL/PLATELET
BASOS ABS: 0 10*3/uL (ref 0–0.1)
BASOS PCT: 1 %
EOS ABS: 0 10*3/uL (ref 0–0.7)
Eosinophils Relative: 2 %
HCT: 36.3 % (ref 35.0–47.0)
Hemoglobin: 12.3 g/dL (ref 12.0–16.0)
LYMPHS PCT: 26 %
Lymphs Abs: 0.7 10*3/uL — ABNORMAL LOW (ref 1.0–3.6)
MCH: 32.4 pg (ref 26.0–34.0)
MCHC: 33.7 g/dL (ref 32.0–36.0)
MCV: 96.1 fL (ref 80.0–100.0)
Monocytes Absolute: 0.3 10*3/uL (ref 0.2–0.9)
Monocytes Relative: 11 %
Neutro Abs: 1.6 10*3/uL (ref 1.4–6.5)
Neutrophils Relative %: 60 %
PLATELETS: 236 10*3/uL (ref 150–440)
RBC: 3.78 MIL/uL — AB (ref 3.80–5.20)
RDW: 16.2 % — ABNORMAL HIGH (ref 11.5–14.5)
WBC: 2.6 10*3/uL — AB (ref 3.6–11.0)

## 2017-09-30 MED ORDER — IOPAMIDOL (ISOVUE-300) INJECTION 61%: 75.0000 mL | Freq: Once | INTRAVENOUS | Status: DC | PRN

## 2017-09-30 NOTE — Progress Notes (Signed)
Gynecologic Oncology Interval Visit   Referring Provider: Benjaman Kindler, MD  Chief Concern:  advanced tubal cancer s/p chemotherapy, she presents for surveillance today.    Subjective:  Lisa Hayden is a 83 y.o. female who is seen in consultation from Dr. 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   She completed 7 cycles of carboplatinum and Taxol with Dr. Grayland Ormond on 08/28/2017.    Lab Results  Component Value Date   CA125 22.3 05/08/2017   CA125 74.8 (H) 01/15/2017    CT scan 09/30/2017 IMPRESSION: 1. Several new hypodense liver lesions, worrisome for metastatic disease. 2. Left inguinal lymph node is no longer visualized. Borderline enlarged distal periesophageal/retrocrural and abdominal retroperitoneal lymph nodes are stable. No evidence of recurrent peritoneal carcinomatosis. 3. Cholelithiasis. 4.  Aortic atherosclerosis (ICD10-170.0).  She presents today to discuss options.   Gynecologic Oncology History Lisa Hayden is a 83 y.o. female who is seen in consultation from Dr. 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 She was transferred to [facility] for care.   CT scan abdomen and pelvis on 01/15/2017 IMPRESSION: Cholelithiasis and distal small bowel obstruction is noted with transition zone seen in the pelvis, most likely due to adjacent peritoneal implant or metastatic disease. Omental caking is seen in left lower quadrant, with another peritoneal implant seen in left posterior pelvis. This is concerning for peritoneal carcinomatosis.  CXR; negative for metastasis  She was transferred to [facility] and on 01/23/3017 underwent a diagnostic laparoscopy conversion to laparotomy with TAH BSO, omentectomy, SBRxRA and optimal tumor debulking.    Pathology  A. 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  B. Small bowel, partial resection: Metastatic high grade serous carcinoma. Two lymph nodes, all negative for metastatic carcinoma (0/2).  C. Colon mesentery nodule, resection: Metastatic high grade serous carcinoma.  D. 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 Ovary with no pathologic diagnosis.  E. Right ovary and right fallopian tube, right salpingo-oophorectomy: Ovary and fallopian tube with no pathologic diagnosis. Negative for malignancy.  F. Uterus, hysterectomy: Uterus (50 grams): Endometrium:Inactive Myometrium: No pathologic diagnosis. Cervix: Negative for malignancy. Serosa: Negative for malignancy.   OVARY or FALLOPIAN TUBE: Oophorectomy, Salpingectomy, Salpingo-Oophorectomy, Subtotal Oophorectomy or Removal of Tumor in Fragments, Hysterectomy With Salpingo-Oophorectomy or Salpingectomy(Ovary FT - All Specimens)     CT scan abdomen/pelvis 06/13/2017 IMPRESSION: 1. Marked response to therapy of omental/peritoneal metastasis. No convincing evidence of residual peritoneal disease. 2. Decreased size of abdominal retroperitoneal nodes. Slight enlargement of a left inguinal node. Findings suggest mixed response to therapy of nodal metastasis. "Vascular/Lymphatic: Aortic and branch vessel atherosclerosis. Left periaortic node measures 7 mm on image 24/series 2 versus 10 mm on the prior exam. A left inguinal node measures 11 x 19 mm on image 60/series 2. Compare 9 x 16 mm on the prior."  3. No residual or recurrent small bowel obstruction. 4. Cholelithiasis. 5.  Aortic Atherosclerosis (ICD10-I70.0). 6.  Possible constipation.  07/02/2017 Core biopsy left inguinal node: METASTATIC HIGH GRADE SEROUS CARCINOMA.    Plan for 3 more cycles of chemotherapy.   Genetic testing: pending, deferred to Dr. Grayland Ormond  Problem List: Patient Active Problem List   Diagnosis Date Noted  . Malignant neoplasm  of ovary ([place]) 06/26/2017  . Carcinoma of fallopian tube ([place]) 02/27/2017  . Protein-calorie malnutrition, severe 01/18/2017  . Peritoneal carcinomatosis ([place]) 01/16/2017  . SBO (small bowel obstruction) ([place]) 01/16/2017  . Small bowel obstruction ([place])   . Malignant neoplasm of colon ([place]) 05/08/2016  . Reactive airway disease 05/04/2015  . Bronchitis 05/04/2015  . Asthma without status asthmaticus 05/03/2015  . Back ache 05/03/2015  . Body mass index (BMI) of 24.0-24.9 in adult 05/03/2015  . Breast CA ([place]) 05/03/2015  . Blood pressure elevated 05/03/2015  . Foot pain 05/03/2015  . BP (high blood pressure) 05/03/2015  . Leg pain 05/03/2015  . Lichen sclerosus of female genitalia 05/03/2015  . Neutropenia ([place]) 05/03/2015  . Tendinitis of wrist 05/03/2015  . Atrophy of vagina 05/03/2015  . Avitaminosis D 05/03/2015  . CN (constipation) 01/11/2010  . Mechanical and motor problems with internal organs 09/21/2009  . Arthralgia of shoulder 08/31/2009  . Personal history of malignant neoplasm of breast 04/13/2009  . OP (osteoporosis) 04/13/2009    Past Medical History: Past Medical History:  Diagnosis Date  . Cancer [facility])    peritoneal carcinoma  . Hypertension   . Osteoporosis     Past Surgical History: Past Surgical History:  Procedure Laterality Date  . 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 APPENDECTOMY  1953  . BREAST LUMPECTOMY Left 10/29/94   negative nodes. Treated with radiation only  . BREAST SURGERY    . BUNIONECTOMY  1979  . CATARACT EXTRACTION, BILATERAL     left 07/29/2012; right 06/24/2012  . COLON SURGERY  05/1991   secondary to cancer   . ROTATOR CUFF REPAIR Right 01/25/10  . TONSILLECTOMY  1938   and adenoidectomy     Past Gynecologic History:  As per interval history  OB History:  OB History  Gravida Para Term Preterm AB Living  1 1          SAB TAB Ectopic Multiple Live Births               # Outcome Date GA Lbr Len/2nd Weight Sex Delivery Anes PTL Lv  1 Para               Family History: Family History  Problem Relation Age of Onset  . CVA Mother   . Congestive Heart Failure Mother   . Diabetes Sister   . Asthma Father     Social History: Social History   Socioeconomic History  . Marital status: Married    Spouse name: [name]  . Number of children: 1  . Years of education: College  . Highest education level: Not on file  Social Needs  . Financial resource strain: Not on file  . Food insecurity - worry: Not on file  . Food insecurity - inability: Not on file  . Transportation needs - medical: Not on file  . Transportation needs - non-medical: Not on file  Occupational History  . Occupation: Retired  Tobacco Use  . Smoking status: Former Research scientist (life sciences)  . Smokeless tobacco: Never Used  Substance and Sexual Activity  . Alcohol use: Yes    Alcohol/week: 4.2 oz    Types: 7 Standard drinks or equivalent per week    Comment: glass or beer at dinner  . Drug use: No  . Sexual activity: Not on file  Other Topics Concern  . Not on file  Social History Narrative  . Not on file  Allergies: Allergies  Allergen Reactions  . Actonel [Risedronate Sodium] Other (See Comments)    headache  . Cortisone     very alert and did not sleep for 24 hrs  . Daypro  [Oxaprozin]   . Meloxicam     Current Medications: Current Outpatient Medications  Medication Sig Dispense Refill  . alendronate (FOSAMAX) 70 MG tablet Take 1 tablet (70 mg total) by mouth once a week. On Sunday 12 tablet 4  . aspirin 81 MG chewable tablet Chew 81 mg by mouth daily.    . cholecalciferol (VITAMIN D) 1000 UNITS tablet Take 2,000 Units by mouth every evening. With dinner     . polyethylene glycol (MIRALAX /  GLYCOLAX) packet Take 17 g by mouth as needed.    . ondansetron (ZOFRAN) 8 MG tablet Take 1 tablet (8 mg total) by mouth every 8 (eight) hours as needed for nausea or vomiting. (Patient not taking: Reported on 06/26/2017) 60 tablet 0  . prochlorperazine (COMPAZINE) 10 MG tablet Take 1 tablet (10 mg total) by mouth every 6 (six) hours as needed for nausea or vomiting. (Patient not taking: Reported on 06/26/2017) 60 tablet 0   No current facility-administered medications for this visit.     Review of Systems General: negative  Skin: negative HEENT: negative Pulmonary: negative Cardiac: negative Gastrointestinal: negative Genitourinary/Sexual: negative Ob/Gyn: negative  Musculoskeletal: negative Hematology: negative Neurologic/Psych: negative  Objective:  Physical Examination:  BP (!) 147/87 (BP Location: Right Arm, Patient Position: Sitting)   Pulse 89   Temp 97.8 F (36.6 C) (Tympanic)   Wt 118 lb (53.5 kg)   BMI 21.58 kg/m    ECOG Performance Status: 1 - Symptomatic but completely ambulatory  General appearance: alert, cooperative and appears stated age HEENT:PERRLA, extra ocular movement intact and sclera clear, anicteric Lymph node survey: non-palpable, axillary, right inguinal, supraclavicular. Left inguinal adenopathy approximately 1.5 cm irregular, nontender, and nodular.  Cardiovascular: regular rate and rhythm Respiratory: normal air entry, lungs clear to auscultation Abdomen: soft, non-tender, without masses or organomegaly, no hernias and well healed incision. The RLQ port incision is puckered previously palpated nodularity around the port site has resolved.  Back: inspection of back is normal Extremities: extremities normal, atraumatic, no cyanosis or edema Skin exam - normal. No extensive bruising.  Neurological exam reveals alert, oriented, normal speech, no focal findings or movement disorder noted.  Pelvic exam: exam chaperoned Urethra: prolapsed tissue  bilaterally, vascular, but does not appear to be malignant. VULVA: normal appearing vulva with no masses, tenderness or lesions, VAGINA: normal appearing vagina with normal color and discharge, no lesions, CERVIX: surgically absent, UTERUS: surgically absent, vaginal cuff well healed, ADNEXA: no masses, surgically absent bilateral, RECTAL:  confirms    Lab Review CA125 pending  CMP     Component Value Date/Time   NA 138 09/30/2017 0931   NA 142 05/09/2016 0953   K 3.5 09/30/2017 0931   CL 103 09/30/2017 0931   CO2 25 09/30/2017 0931   GLUCOSE 101 (H) 09/30/2017 0931   BUN 12 09/30/2017 0931   BUN 13 05/09/2016 0953   CREATININE 0.55 09/30/2017 0931   CALCIUM 9.5 09/30/2017 0931   PROT 7.2 09/30/2017 0931   PROT 6.5 05/09/2016 0953   ALBUMIN 4.3 09/30/2017 0931   ALBUMIN 4.3 05/09/2016 0953   AST 18 09/30/2017 0931   ALT 13 (L) 09/30/2017 0931   ALKPHOS 57 09/30/2017 0931   BILITOT 1.3 (H) 09/30/2017 0931   BILITOT 0.9 05/09/2016 9211  GFRNONAA >60 09/30/2017 0931   GFRAA >60 09/30/2017 0931     Lab Results  Component Value Date   WBC 2.6 (L) 09/30/2017   HGB 12.3 09/30/2017   HCT 36.3 09/30/2017   MCV 96.1 09/30/2017   PLT 236 09/30/2017     Radiologic Imaging: CT reviewed.     Assessment:  Lisa Hayden is a 83 y.o. female diagnosed with advanced HGS fallopian tube cancer s/p exploratory laparotomy with TAH BSO, omentectomy, SBRxRA and optimal tumor debulking.  S/p 7 cycles of chemotherapy with normalization of CA125 after 4 cycles, but biopsy confirmed inguinal node disease, completed 3 more cycles for a total of 7. CT scan is concerning for progressive disease with liver mets. Essentially mixed response.    Elevated bilirubin, most likely due to taxane therapy.   Medical co-morbidities complicating care: Protein-calorie malnutrition, severe; h/o Malignant neoplasm of colon ([place]) and breast cancer; Asthma; osteoporosis, and frailty.  Plan:   Problem List  Items Addressed This Visit      Genitourinary   Carcinoma of fallopian tube ([place]) - Primary     We discussed management options and I reviewed with Dr. Grayland Ormond. We both agree with a chemotherapy break and repeat exam and CT assessment with CA125 in 3 months. She emphasized the importance of quality of life and even discussed interest in physician-assisted suicide. She is not actively considering this at this time.   Will check on Genetic testing with Dr. Grayland Ormond. We discussed with him today.   Elevated bilirubin, minimal elevation and asymptomatic. Continue to follow.    The patient's diagnosis, an outline of the further diagnostic and laboratory studies which will be required, the recommendation, and alternatives were discussed.  All questions were answered to the patient's satisfaction.  A total of 60 minutes were spent with the patient/family today; 75% was spent in education, counseling and coordination of care for tubal cancer.  Exam performed with Beckey Rutter, NP.     Gillis Ends, MD   CC:  Benjaman Kindler, MD  Lelon Huh, MD

## 2017-10-02 ENCOUNTER — Inpatient Hospital Stay: Payer: Medicare Other

## 2017-10-02 ENCOUNTER — Inpatient Hospital Stay (HOSPITAL_BASED_OUTPATIENT_CLINIC_OR_DEPARTMENT_OTHER): Payer: Medicare Other | Admitting: Obstetrics and Gynecology

## 2017-10-02 ENCOUNTER — Inpatient Hospital Stay (HOSPITAL_BASED_OUTPATIENT_CLINIC_OR_DEPARTMENT_OTHER): Payer: Medicare Other | Admitting: Oncology

## 2017-10-02 VITALS — BP 147/87 | HR 89 | Temp 97.8°F | Wt 118.0 lb

## 2017-10-02 DIAGNOSIS — C5702 Malignant neoplasm of left fallopian tube: Secondary | ICD-10-CM | POA: Diagnosis not present

## 2017-10-02 DIAGNOSIS — Z9221 Personal history of antineoplastic chemotherapy: Secondary | ICD-10-CM | POA: Diagnosis not present

## 2017-10-02 DIAGNOSIS — C786 Secondary malignant neoplasm of retroperitoneum and peritoneum: Secondary | ICD-10-CM | POA: Diagnosis not present

## 2017-10-02 DIAGNOSIS — Z7982 Long term (current) use of aspirin: Secondary | ICD-10-CM

## 2017-10-02 DIAGNOSIS — Z90710 Acquired absence of both cervix and uterus: Secondary | ICD-10-CM

## 2017-10-02 DIAGNOSIS — Z90722 Acquired absence of ovaries, bilateral: Secondary | ICD-10-CM

## 2017-10-02 DIAGNOSIS — C801 Malignant (primary) neoplasm, unspecified: Secondary | ICD-10-CM | POA: Diagnosis not present

## 2017-10-02 DIAGNOSIS — J45909 Unspecified asthma, uncomplicated: Secondary | ICD-10-CM

## 2017-10-02 DIAGNOSIS — K769 Liver disease, unspecified: Secondary | ICD-10-CM | POA: Diagnosis not present

## 2017-10-02 DIAGNOSIS — C774 Secondary and unspecified malignant neoplasm of inguinal and lower limb lymph nodes: Secondary | ICD-10-CM | POA: Diagnosis not present

## 2017-10-02 DIAGNOSIS — I10 Essential (primary) hypertension: Secondary | ICD-10-CM

## 2017-10-02 DIAGNOSIS — E43 Unspecified severe protein-calorie malnutrition: Secondary | ICD-10-CM

## 2017-10-02 DIAGNOSIS — Z79899 Other long term (current) drug therapy: Secondary | ICD-10-CM

## 2017-10-02 DIAGNOSIS — C5700 Malignant neoplasm of unspecified fallopian tube: Secondary | ICD-10-CM

## 2017-10-02 DIAGNOSIS — I700 Atherosclerosis of aorta: Secondary | ICD-10-CM

## 2017-10-02 DIAGNOSIS — D72819 Decreased white blood cell count, unspecified: Secondary | ICD-10-CM

## 2017-10-02 DIAGNOSIS — K8020 Calculus of gallbladder without cholecystitis without obstruction: Secondary | ICD-10-CM

## 2017-10-02 DIAGNOSIS — R54 Age-related physical debility: Secondary | ICD-10-CM

## 2017-10-02 DIAGNOSIS — Z87891 Personal history of nicotine dependence: Secondary | ICD-10-CM

## 2017-10-02 NOTE — Progress Notes (Signed)
Patient here for follow up today. She states that she is feeling very well and denies having any pain.

## 2017-10-03 LAB — CA 125: Cancer Antigen (CA) 125: 42.8 U/mL — ABNORMAL HIGH (ref 0.0–38.1)

## 2017-10-15 ENCOUNTER — Telehealth: Payer: Self-pay | Admitting: *Deleted

## 2017-10-15 NOTE — Telephone Encounter (Signed)
I called patient and advised per Dr Grayland Ormond that she can take Tylenol or Ibuprofen and if that does not work to call back and we can send in some Tramadol. She thanked me and stated she is going to take some right now

## 2017-10-15 NOTE — Telephone Encounter (Signed)
Tylenol or ibuprofen will be fine.  If this is not helpful we can call in tramadol.

## 2017-10-15 NOTE — Telephone Encounter (Signed)
Patient called complaining of pain in her back times 3 days. It gets as bad as a 9 out of 10 and at times it is a 0. It has moved all around her back started at waist then scapula. She is asking what she can take for the pain when it gets worse. She states it gets worse as the day goes on. Please advise. She has not taken any over the counter medications as her "chemotherapy book said not to"

## 2017-10-23 ENCOUNTER — Telehealth: Payer: Self-pay | Admitting: *Deleted

## 2017-10-23 NOTE — Telephone Encounter (Signed)
Ok to both tylenol and nausea meds.

## 2017-10-23 NOTE — Telephone Encounter (Signed)
Patient advised of doctor response

## 2017-10-23 NOTE — Telephone Encounter (Signed)
Patient reports she had nausea for the first time this morning and is asking if her Prescription sent in April is still good to use. I advised that it is and asked her why she is nauseated since she is no longer on  Treatment.She does not know what caused it, but will fill her prescription she never got in April and let me know if the nausea continues. She also would like for Dr Grayland Ormond to tell her if she is to continue Tylenol for her back pain, she states"  Miss him telling me everything is alright and what to do". Please advise regarding Tylenol for back pain

## 2017-11-01 ENCOUNTER — Ambulatory Visit (INDEPENDENT_AMBULATORY_CARE_PROVIDER_SITE_OTHER): Payer: Medicare Other

## 2017-11-01 VITALS — BP 142/80 | HR 76 | Temp 97.6°F | Ht 62.0 in | Wt 121.2 lb

## 2017-11-01 DIAGNOSIS — Z0000 Encounter for general adult medical examination without abnormal findings: Secondary | ICD-10-CM | POA: Diagnosis not present

## 2017-11-01 NOTE — Patient Instructions (Signed)
Ms. Tagle , Thank you for taking time to come for your Medicare Wellness Visit. I appreciate your ongoing commitment to your health goals. Please review the following plan we discussed and let me know if I can assist you in the future.   Screening recommendations/referrals: Colonoscopy: up to date Mammogram: up to date Bone Density: up to date Recommended yearly ophthalmology/optometry visit for glaucoma screening and checkup Recommended yearly dental visit for hygiene and checkup  Vaccinations: Influenza vaccine: completed Pneumococcal vaccine: up to date Tdap vaccine: up to date Shingles vaccine: last completed in 2000    Advanced directives: Please bring a copy of your POA (Power of [place]) and/or Living Will to your next appointment.   Conditions/risks identified: Recommend increasing water intake to 4 glasses of water a day.   Next appointment:    Preventive Care 81 Years and Older, Female Preventive care refers to lifestyle choices and visits with your health care provider that can promote health and wellness. What does preventive care include?  A yearly physical exam. This is also called an annual well check.  Dental exams once or twice a year.  Routine eye exams. Ask your health care provider how often you should have your eyes checked.  Personal lifestyle choices, including:  Daily care of your teeth and gums.  Regular physical activity.  Eating a healthy diet.  Avoiding tobacco and drug use.  Limiting alcohol use.  Practicing safe sex.  Taking low-dose aspirin every day.  Taking vitamin and mineral supplements as recommended by your health care provider. What happens during an annual well check? The services and screenings done by your health care provider during your annual well check will depend on your age, overall health, lifestyle risk factors, and family history of disease. Counseling  Your health care provider may ask you questions about  your:  Alcohol use.  Tobacco use.  Drug use.  Emotional well-being.  Home and relationship well-being.  Sexual activity.  Eating habits.  History of falls.  Memory and ability to understand (cognition).  Work and work Statistician.  Reproductive health. Screening  You may have the following tests or measurements:  Height, weight, and BMI.  Blood pressure.  Lipid and cholesterol levels. These may be checked every 5 years, or more frequently if you are over 75 years old.  Skin check.  Lung cancer screening. You may have this screening every year starting at age 81 if you have a 30-pack-year history of smoking and currently smoke or have quit within the past 15 years.  Fecal occult blood test (FOBT) of the stool. You may have this test every year starting at age 81.  Flexible sigmoidoscopy or colonoscopy. You may have a sigmoidoscopy every 5 years or a colonoscopy every 10 years starting at age 81.  Hepatitis C blood test.  Hepatitis B blood test.  Sexually transmitted disease (STD) testing.  Diabetes screening. This is done by checking your blood sugar (glucose) after you have not eaten for a while (fasting). You may have this done every 1-3 years.  Bone density scan. This is done to screen for osteoporosis. You may have this done starting at age 81.  Mammogram. This may be done every 1-2 years. Talk to your health care provider about how often you should have regular mammograms. Talk with your health care provider about your test results, treatment options, and if necessary, the need for more tests. Vaccines  Your health care provider may recommend certain vaccines, such as:  Influenza  vaccine. This is recommended every year.  Tetanus, diphtheria, and acellular pertussis (Tdap, Td) vaccine. You may need a Td booster every 10 years.  Zoster vaccine. You may need this after age 81.  Pneumococcal 13-valent conjugate (PCV13) vaccine. One dose is recommended  after age 81.  Pneumococcal polysaccharide (PPSV23) vaccine. One dose is recommended after age 81. Talk to your health care provider about which screenings and vaccines you need and how often you need them. This information is not intended to replace advice given to you by your health care provider. Make sure you discuss any questions you have with your health care provider. Document Released: 12/02/2015 Document Revised: 07/25/2016 Document Reviewed: 09/06/2015 Elsevier Interactive Patient Education  2017 [place] Prevention in the Home Falls can cause injuries. They can happen to people of all ages. There are many things you can do to make your home safe and to help prevent falls. What can I do on the outside of my home?  Regularly fix the edges of walkways and driveways and fix any cracks.  Remove anything that might make you trip as you walk through a door, such as a raised step or threshold.  Trim any bushes or trees on the path to your home.  Use bright outdoor lighting.  Clear any walking paths of anything that might make someone trip, such as rocks or tools.  Regularly check to see if handrails are loose or broken. Make sure that both sides of any steps have handrails.  Any raised decks and porches should have guardrails on the edges.  Have any leaves, snow, or ice cleared regularly.  Use sand or salt on walking paths during winter.  Clean up any spills in your garage right away. This includes oil or grease spills. What can I do in the bathroom?  Use night lights.  Install grab bars by the toilet and in the tub and shower. Do not use towel bars as grab bars.  Use non-skid mats or decals in the tub or shower.  If you need to sit down in the shower, use a plastic, non-slip stool.  Keep the floor dry. Clean up any water that spills on the floor as soon as it happens.  Remove soap buildup in the tub or shower regularly.  Attach bath mats securely with  double-sided non-slip rug tape.  Do not have throw rugs and other things on the floor that can make you trip. What can I do in the bedroom?  Use night lights.  Make sure that you have a light by your bed that is easy to reach.  Do not use any sheets or blankets that are too big for your bed. They should not hang down onto the floor.  Have a firm chair that has side arms. You can use this for support while you get dressed.  Do not have throw rugs and other things on the floor that can make you trip. What can I do in the kitchen?  Clean up any spills right away.  Avoid walking on wet floors.  Keep items that you use a lot in easy-to-reach places.  If you need to reach something above you, use a strong step stool that has a grab bar.  Keep electrical cords out of the way.  Do not use floor polish or wax that makes floors slippery. If you must use wax, use non-skid floor wax.  Do not have throw rugs and other things on the floor that can  make you trip. What can I do with my stairs?  Do not leave any items on the stairs.  Make sure that there are handrails on both sides of the stairs and use them. Fix handrails that are broken or loose. Make sure that handrails are as long as the stairways.  Check any carpeting to make sure that it is firmly attached to the stairs. Fix any carpet that is loose or worn.  Avoid having throw rugs at the top or bottom of the stairs. If you do have throw rugs, attach them to the floor with carpet tape.  Make sure that you have a light switch at the top of the stairs and the bottom of the stairs. If you do not have them, ask someone to add them for you. What else can I do to help prevent falls?  Wear shoes that:  Do not have high heels.  Have rubber bottoms.  Are comfortable and fit you well.  Are closed at the toe. Do not wear sandals.  If you use a stepladder:  Make sure that it is fully opened. Do not climb a closed stepladder.  Make  sure that both sides of the stepladder are locked into place.  Ask someone to hold it for you, if possible.  Clearly mark and make sure that you can see:  Any grab bars or handrails.  First and last steps.  Where the edge of each step is.  Use tools that help you move around (mobility aids) if they are needed. These include:  Canes.  Walkers.  Scooters.  Crutches.  Turn on the lights when you go into a dark area. Replace any light bulbs as soon as they burn out.  Set up your furniture so you have a clear path. Avoid moving your furniture around.  If any of your floors are uneven, fix them.  If there are any pets around you, be aware of where they are.  Review your medicines with your doctor. Some medicines can make you feel dizzy. This can increase your chance of falling. Ask your doctor what other things that you can do to help prevent falls. This information is not intended to replace advice given to you by your health care provider. Make sure you discuss any questions you have with your health care provider. Document Released: 09/01/2009 Document Revised: 04/12/2016 Document Reviewed: 12/10/2014 Elsevier Interactive Patient Education  2017 [employer].

## 2017-11-01 NOTE — Progress Notes (Signed)
Subjective:   Lisa Hayden is a 83 y.o. female who presents for Medicare Annual (Subsequent) preventive examination.  Review of Systems:  N/A  Cardiac Risk Factors include: advanced age (>62men, >57 women);hypertension     Objective:     Vitals: BP (!) 142/80 (BP Location: Left Arm)   Pulse 76   Temp 97.6 F (36.4 C) (Oral)   Ht 5\' 2"  (1.575 m)   Wt 121 lb 3.2 oz (55 kg)   BMI 22.17 kg/m   Body mass index is 22.17 kg/m.  Advanced Directives 11/01/2017 08/28/2017 08/07/2017 07/17/2017 06/26/2017 03/27/2017 03/14/2017  Does Patient Have a Medical Advance Directive? Yes Yes Yes Yes Yes Yes Yes  Type of Paramedic of [place];Living will [place];Living will Living will;Healthcare Power of Attorney Living will;Healthcare Power of [place];Living will [place];Living will Living will;Healthcare Power of Attorney  Does patient want to make changes to medical advance directive? - - - - No - Patient declined No - Patient declined -  Copy of [place] in Chart? No - copy requested Yes - - Yes No - copy requested No - copy requested    Tobacco Social History   Tobacco Use  Smoking Status Former Smoker  . Types: Cigarettes  Smokeless Tobacco Never Used     Counseling given: Not Answered   Clinical Intake:  Pre-visit preparation completed: Yes  Pain : 0-10 Pain Score: 5  Pain Type: Acute pain Pain Location: Back Pain Orientation: Mid Pain Descriptors / Indicators: Constant Pain Relieving Factors: tylenol helps Effect of Pain on Daily Activities: due to coming off chemo  Pain Relieving Factors: tylenol helps  Nutritional Status: BMI of 19-24  Normal Nutritional Risks: Nausea/ vomitting/ diarrhea(nausea due to chemotherapy) Diabetes: No  How often do you need to have someone help you when you read instructions, pamphlets, or other written materials from your  doctor or pharmacy?: 1 - Never What is the last grade level you completed in school?: 2 years college  Interpreter Needed?: No  Information entered by :: [facility], LPN  Past Medical History:  Diagnosis Date  . Cancer [facility])    peritoneal carcinoma  . Hypertension   . Osteoporosis    Past Surgical History:  Procedure Laterality Date  . 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 APPENDECTOMY  1953  . BREAST LUMPECTOMY Left 10/29/94   negative nodes. Treated with radiation only  . BREAST SURGERY    . BUNIONECTOMY  1979  . CATARACT EXTRACTION, BILATERAL     left 07/29/2012; right 06/24/2012  . COLON SURGERY  05/1991   secondary to cancer   . ROTATOR CUFF REPAIR Right 01/25/10  . TONSILLECTOMY  1938   and adenoidectomy   Family History  Problem Relation Age of Onset  . CVA Mother   . Congestive Heart Failure Mother   . Diabetes Sister   . Asthma Father    Social History   Socioeconomic History  . Marital status: Married    Spouse name: [name]  . Number of children: 1  . Years of education: College  . Highest education level: None  Social Needs  . Financial resource strain: Not hard at all  . Food insecurity - worry: Never true  . Food insecurity - inability: Never true  . Transportation needs - medical: No  . Transportation needs - non-medical: No  Occupational History  .  Occupation: Retired  Tobacco Use  . Smoking status: Former Smoker    Types: Cigarettes  . Smokeless tobacco: Never Used  Substance and Sexual Activity  . Alcohol use: Yes    Alcohol/week: 4.2 oz    Types: 7 Standard drinks or equivalent per week    Comment: glass or beer at dinner  . Drug use: No  . Sexual activity: None  Other Topics Concern  . None  Social History Narrative  . None    Outpatient Encounter Medications as of 11/01/2017  Medication Sig  . alendronate (FOSAMAX) 70 MG tablet Take 1 tablet (70 mg total) by mouth once a week. On  Sunday  . cholecalciferol (VITAMIN D) 1000 UNITS tablet Take 2,000 Units by mouth every evening. With dinner   . polyethylene glycol (MIRALAX / GLYCOLAX) packet Take 17 g by mouth as needed.  . ondansetron (ZOFRAN) 8 MG tablet Take 1 tablet (8 mg total) by mouth every 8 (eight) hours as needed for nausea or vomiting. (Patient not taking: Reported on 06/26/2017)  . prochlorperazine (COMPAZINE) 10 MG tablet Take 1 tablet (10 mg total) by mouth every 6 (six) hours as needed for nausea or vomiting. (Patient not taking: Reported on 06/26/2017)  . [DISCONTINUED] aspirin 81 MG chewable tablet Chew 81 mg by mouth daily.   No facility-administered encounter medications on file as of 11/01/2017.     Activities of Daily Living In your present state of health, do you have any difficulty performing the following activities: 11/01/2017 01/16/2017  Hearing? Tempie Donning  Comment wears bilateral hearing aids -  Vision? N N  Difficulty concentrating or making decisions? N N  Walking or climbing stairs? N N  Dressing or bathing? N N  Doing errands, shopping? N N  Preparing Food and eating ? N -  Using the Toilet? N -  In the past six months, have you accidently leaked urine? Y -  Comment wears protectection -  Do you have problems with loss of bowel control? N -  Managing your Medications? N -  Managing your Finances? N -  Housekeeping or managing your Housekeeping? N -  Some recent data might be hidden    Patient Care Team: Birdie Sons, MD as PCP - General (Family Medicine) Clent Jacks, RN as Registered Nurse Birder Robson, MD as Referring Physician (Ophthalmology) Lloyd Huger, MD as Consulting Physician (Oncology) Benjaman Kindler, MD as Consulting Physician (Obstetrics and Gynecology) Gillis Ends, MD as Referring Physician (Obstetrics and Gynecology) Oneta Rack, MD as Consulting Physician (Dermatology)    Assessment:   This is a routine wellness examination for  [place].  Exercise Activities and Dietary recommendations Current Exercise Habits: The patient does not participate in regular exercise at present, Exercise limited by: Other - see comments(nothing lately due to cancer)  Goals    . DIET - INCREASE WATER INTAKE     Recommend increasing water intake to 4 glasses of water a day.        Fall Risk Fall Risk  11/01/2017 05/08/2016 05/18/2015  Falls in the past year? No No No   Is the patient's home free of loose throw rugs in walkways, pet beds, electrical cords, etc?   yes      Grab bars in the bathroom? yes      Handrails on the stairs?   yes      Adequate lighting?   yes   Depression Screen PHQ 2/9 Scores 11/01/2017 05/08/2016 05/18/2015  PHQ -  2 Score 0 0 -  Exception Documentation - - Other- indicate reason in comment box  Not completed - - Pt currently moving out of apartment, was stressful event for her     Cognitive Function     6CIT Screen 11/01/2017  What Year? 0 points  What month? 0 points  What time? 0 points  Count back from 20 0 points  Months in reverse 0 points  Repeat phrase 2 points  Total Score 2    Immunization History  Administered Date(s) Administered  . Influenza, High Dose Seasonal PF 09/13/2016  . Influenza-Unspecified 09/13/2016  . Pneumococcal Conjugate-13 10/11/2014  . Pneumococcal Polysaccharide-23 05/19/2001  . Td 05/19/2001  . Tdap 03/22/2011  . Zoster 11/19/1998    Qualifies for Shingles Vaccine? Yes, but pt is going to wait until done with chemotherapy.  Screening Tests Health Maintenance  Topic Date Due  . TETANUS/TDAP  03/21/2021  . INFLUENZA VACCINE  Completed  . DEXA SCAN  Completed  . PNA vac Low Risk Adult  Completed    Cancer Screenings: Lung: Low Dose CT Chest recommended if Age 65-80 years, 30 pack-year currently smoking OR have quit w/in 15years. Patient does not qualify. Breast:  Up to date on Mammogram? Yes   Up to date of Bone Density/Dexa? Yes Colorectal:  Yes  Additional Screenings:  Hepatitis B/HIV/Syphillis: Hepatitis C Screening:      Plan:  I have personally reviewed and addressed the Medicare Annual Wellness questionnaire and have noted the following in the patient's chart:  A. Medical and social history B. Use of alcohol, tobacco or illicit drugs  C. Current medications and supplements D. Functional ability and status E.  Nutritional status F.  Physical activity G. Advance directives H. List of other physicians I.  Hospitalizations, surgeries, and ER visits in previous 12 months J.  [place] such as hearing and vision if needed, cognitive and depression L. Referrals and appointments - none  In addition, I have reviewed and discussed with patient certain preventive protocols, quality metrics, and best practice recommendations. A written personalized care plan for preventive services as well as general preventive health recommendations were provided to patient.  See attached scanned questionnaire for additional information.   Signed,  Fabio Neighbors, LPN Nurse Health Advisor   Nurse Recommendations: None. Advised pt to schedule a physical with PCP. Pt is switching to Dr B. (prefers a female MD).

## 2017-11-02 ENCOUNTER — Other Ambulatory Visit: Payer: Self-pay | Admitting: Oncology

## 2017-11-02 MED ORDER — ONDANSETRON HCL 8 MG PO TABS: 8.0000 mg | ORAL_TABLET | Freq: Three times a day (TID) | ORAL | 1 refills | Status: DC | PRN

## 2017-11-04 ENCOUNTER — Other Ambulatory Visit: Payer: Self-pay | Admitting: *Deleted

## 2017-11-04 MED ORDER — OXYCODONE HCL 5 MG PO TABS: 5.0000 mg | ORAL_TABLET | Freq: Four times a day (QID) | ORAL | 0 refills | Status: DC | PRN

## 2017-11-04 NOTE — Progress Notes (Signed)
oxyco 

## 2017-11-13 ENCOUNTER — Ambulatory Visit
Admission: RE | Admit: 2017-11-13 | Discharge: 2017-11-13 | Disposition: A | Discharge status: Home / Self Care | Payer: Medicare Other | Source: Ambulatory Visit | Attending: Oncology | Admitting: Oncology

## 2017-11-13 ENCOUNTER — Other Ambulatory Visit: Payer: Self-pay

## 2017-11-13 ENCOUNTER — Inpatient Hospital Stay
Admission: EM | Admit: 2017-11-13 | Discharge: 2017-11-15 | DRG: 659 | Disposition: A | Discharge status: Hospice | Payer: Medicare Other | Attending: Internal Medicine | Admitting: Internal Medicine

## 2017-11-13 ENCOUNTER — Encounter: Payer: Self-pay | Admitting: Emergency Medicine

## 2017-11-13 DIAGNOSIS — R590 Localized enlarged lymph nodes: Secondary | ICD-10-CM

## 2017-11-13 DIAGNOSIS — Z66 Do not resuscitate: Secondary | ICD-10-CM | POA: Diagnosis present

## 2017-11-13 DIAGNOSIS — N1330 Unspecified hydronephrosis: Secondary | ICD-10-CM

## 2017-11-13 DIAGNOSIS — I129 Hypertensive chronic kidney disease with stage 1 through stage 4 chronic kidney disease, or unspecified chronic kidney disease: Secondary | ICD-10-CM | POA: Diagnosis present

## 2017-11-13 DIAGNOSIS — C569 Malignant neoplasm of unspecified ovary: Secondary | ICD-10-CM

## 2017-11-13 DIAGNOSIS — M810 Age-related osteoporosis without current pathological fracture: Secondary | ICD-10-CM | POA: Diagnosis present

## 2017-11-13 DIAGNOSIS — C801 Malignant (primary) neoplasm, unspecified: Secondary | ICD-10-CM | POA: Insufficient documentation

## 2017-11-13 DIAGNOSIS — N131 Hydronephrosis with ureteral stricture, not elsewhere classified: Secondary | ICD-10-CM | POA: Diagnosis not present

## 2017-11-13 DIAGNOSIS — Z90710 Acquired absence of both cervix and uterus: Secondary | ICD-10-CM

## 2017-11-13 DIAGNOSIS — I700 Atherosclerosis of aorta: Secondary | ICD-10-CM | POA: Insufficient documentation

## 2017-11-13 DIAGNOSIS — N183 Chronic kidney disease, stage 3 (moderate): Secondary | ICD-10-CM | POA: Diagnosis present

## 2017-11-13 DIAGNOSIS — Z9841 Cataract extraction status, right eye: Secondary | ICD-10-CM

## 2017-11-13 DIAGNOSIS — Z515 Encounter for palliative care: Secondary | ICD-10-CM

## 2017-11-13 DIAGNOSIS — R1032 Left lower quadrant pain: Secondary | ICD-10-CM | POA: Diagnosis not present

## 2017-11-13 DIAGNOSIS — R918 Other nonspecific abnormal finding of lung field: Secondary | ICD-10-CM

## 2017-11-13 DIAGNOSIS — C787 Secondary malignant neoplasm of liver and intrahepatic bile duct: Secondary | ICD-10-CM | POA: Diagnosis present

## 2017-11-13 DIAGNOSIS — Z79899 Other long term (current) drug therapy: Secondary | ICD-10-CM

## 2017-11-13 DIAGNOSIS — Z87891 Personal history of nicotine dependence: Secondary | ICD-10-CM

## 2017-11-13 DIAGNOSIS — Z923 Personal history of irradiation: Secondary | ICD-10-CM

## 2017-11-13 DIAGNOSIS — K5900 Constipation, unspecified: Secondary | ICD-10-CM | POA: Diagnosis present

## 2017-11-13 DIAGNOSIS — Z7189 Other specified counseling: Secondary | ICD-10-CM

## 2017-11-13 DIAGNOSIS — C800 Disseminated malignant neoplasm, unspecified: Secondary | ICD-10-CM

## 2017-11-13 DIAGNOSIS — C786 Secondary malignant neoplasm of retroperitoneum and peritoneum: Secondary | ICD-10-CM | POA: Insufficient documentation

## 2017-11-13 DIAGNOSIS — E43 Unspecified severe protein-calorie malnutrition: Secondary | ICD-10-CM | POA: Diagnosis present

## 2017-11-13 DIAGNOSIS — R109 Unspecified abdominal pain: Secondary | ICD-10-CM | POA: Diagnosis present

## 2017-11-13 DIAGNOSIS — Z9842 Cataract extraction status, left eye: Secondary | ICD-10-CM

## 2017-11-13 DIAGNOSIS — Z6824 Body mass index (BMI) 24.0-24.9, adult: Secondary | ICD-10-CM

## 2017-11-13 LAB — POCT I-STAT CREATININE: CREATININE: 1.2 mg/dL — AB (ref 0.44–1.00)

## 2017-11-13 MED ORDER — IOPAMIDOL (ISOVUE-300) INJECTION 61%
60.0000 mL | Freq: Once | INTRAVENOUS | Status: AC | PRN
  Administered 2017-11-13: 60 mL via INTRAVENOUS

## 2017-11-13 NOTE — ED Triage Notes (Addendum)
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 Denies any diarrhea, pt had CT of abd done today due to abd pain. Pt did not get results, took oxycodone prior to coming in.

## 2017-11-14 ENCOUNTER — Inpatient Hospital Stay: Payer: Medicare Other | Admitting: Anesthesiology

## 2017-11-14 ENCOUNTER — Ambulatory Visit: Payer: Self-pay | Admitting: Urology

## 2017-11-14 ENCOUNTER — Encounter
Admission: EM | Disposition: A | Discharge status: Hospice | Payer: Self-pay | Source: Home / Self Care | Attending: Internal Medicine

## 2017-11-14 DIAGNOSIS — Z66 Do not resuscitate: Secondary | ICD-10-CM | POA: Diagnosis present

## 2017-11-14 DIAGNOSIS — Z79899 Other long term (current) drug therapy: Secondary | ICD-10-CM | POA: Diagnosis not present

## 2017-11-14 DIAGNOSIS — Z7189 Other specified counseling: Secondary | ICD-10-CM | POA: Diagnosis not present

## 2017-11-14 DIAGNOSIS — I129 Hypertensive chronic kidney disease with stage 1 through stage 4 chronic kidney disease, or unspecified chronic kidney disease: Secondary | ICD-10-CM | POA: Diagnosis present

## 2017-11-14 DIAGNOSIS — N1330 Unspecified hydronephrosis: Secondary | ICD-10-CM

## 2017-11-14 DIAGNOSIS — K5900 Constipation, unspecified: Secondary | ICD-10-CM

## 2017-11-14 DIAGNOSIS — R1032 Left lower quadrant pain: Secondary | ICD-10-CM

## 2017-11-14 DIAGNOSIS — C800 Disseminated malignant neoplasm, unspecified: Secondary | ICD-10-CM | POA: Diagnosis not present

## 2017-11-14 DIAGNOSIS — Z6824 Body mass index (BMI) 24.0-24.9, adult: Secondary | ICD-10-CM | POA: Diagnosis not present

## 2017-11-14 DIAGNOSIS — Z87891 Personal history of nicotine dependence: Secondary | ICD-10-CM | POA: Diagnosis not present

## 2017-11-14 DIAGNOSIS — Z9841 Cataract extraction status, right eye: Secondary | ICD-10-CM | POA: Diagnosis not present

## 2017-11-14 DIAGNOSIS — N131 Hydronephrosis with ureteral stricture, not elsewhere classified: Secondary | ICD-10-CM | POA: Diagnosis present

## 2017-11-14 DIAGNOSIS — Z9842 Cataract extraction status, left eye: Secondary | ICD-10-CM | POA: Diagnosis not present

## 2017-11-14 DIAGNOSIS — Z90710 Acquired absence of both cervix and uterus: Secondary | ICD-10-CM | POA: Diagnosis not present

## 2017-11-14 DIAGNOSIS — Z515 Encounter for palliative care: Secondary | ICD-10-CM | POA: Diagnosis not present

## 2017-11-14 DIAGNOSIS — Z923 Personal history of irradiation: Secondary | ICD-10-CM | POA: Diagnosis not present

## 2017-11-14 DIAGNOSIS — M810 Age-related osteoporosis without current pathological fracture: Secondary | ICD-10-CM | POA: Diagnosis present

## 2017-11-14 DIAGNOSIS — R109 Unspecified abdominal pain: Secondary | ICD-10-CM | POA: Diagnosis present

## 2017-11-14 DIAGNOSIS — C786 Secondary malignant neoplasm of retroperitoneum and peritoneum: Secondary | ICD-10-CM | POA: Diagnosis present

## 2017-11-14 DIAGNOSIS — C787 Secondary malignant neoplasm of liver and intrahepatic bile duct: Secondary | ICD-10-CM | POA: Diagnosis present

## 2017-11-14 DIAGNOSIS — N183 Chronic kidney disease, stage 3 (moderate): Secondary | ICD-10-CM | POA: Diagnosis present

## 2017-11-14 DIAGNOSIS — C569 Malignant neoplasm of unspecified ovary: Secondary | ICD-10-CM | POA: Diagnosis present

## 2017-11-14 DIAGNOSIS — E43 Unspecified severe protein-calorie malnutrition: Secondary | ICD-10-CM | POA: Diagnosis present

## 2017-11-14 HISTORY — PX: CYSTOSCOPY WITH STENT PLACEMENT: SHX5790

## 2017-11-14 LAB — CBC
HEMATOCRIT: 36.5 % (ref 35.0–47.0)
Hemoglobin: 12.2 g/dL (ref 12.0–16.0)
MCH: 30.9 pg (ref 26.0–34.0)
MCHC: 33.3 g/dL (ref 32.0–36.0)
MCV: 92.6 fL (ref 80.0–100.0)
Platelets: 237 10*3/uL (ref 150–440)
RBC: 3.94 MIL/uL (ref 3.80–5.20)
RDW: 13.6 % (ref 11.5–14.5)
WBC: 7.2 10*3/uL (ref 3.6–11.0)

## 2017-11-14 LAB — COMPREHENSIVE METABOLIC PANEL
ALBUMIN: 4.1 g/dL (ref 3.5–5.0)
ALT: 13 U/L — ABNORMAL LOW (ref 14–54)
ANION GAP: 10 (ref 5–15)
AST: 23 U/L (ref 15–41)
Alkaline Phosphatase: 61 U/L (ref 38–126)
BILIRUBIN TOTAL: 1 mg/dL (ref 0.3–1.2)
BUN: 22 mg/dL — ABNORMAL HIGH (ref 6–20)
CO2: 24 mmol/L (ref 22–32)
Calcium: 9.1 mg/dL (ref 8.9–10.3)
Chloride: 98 mmol/L — ABNORMAL LOW (ref 101–111)
Creatinine, Ser: 1.18 mg/dL — ABNORMAL HIGH (ref 0.44–1.00)
GFR calc Af Amer: 47 mL/min — ABNORMAL LOW (ref >60)
GFR calc non Af Amer: 41 mL/min — ABNORMAL LOW (ref >60)
GLUCOSE: 175 mg/dL — AB (ref 65–99)
POTASSIUM: 3.9 mmol/L (ref 3.5–5.1)
Sodium: 132 mmol/L — ABNORMAL LOW (ref 135–145)
TOTAL PROTEIN: 7.5 g/dL (ref 6.5–8.1)

## 2017-11-14 LAB — URINALYSIS, COMPLETE (UACMP) WITH MICROSCOPIC
BILIRUBIN URINE: NEGATIVE
Bacteria, UA: NONE SEEN
GLUCOSE, UA: 150 mg/dL — AB
HGB URINE DIPSTICK: NEGATIVE
KETONES UR: 20 mg/dL — AB
NITRITE: NEGATIVE
PROTEIN: NEGATIVE mg/dL
SQUAMOUS EPITHELIAL / LPF: NONE SEEN
Specific Gravity, Urine: 1.029 (ref 1.005–1.030)
pH: 6 (ref 5.0–8.0)

## 2017-11-14 LAB — TSH: TSH: 1.957 u[IU]/mL (ref 0.350–4.500)

## 2017-11-14 LAB — HEMOGLOBIN A1C
Hgb A1c MFr Bld: 5.4 % (ref 4.8–5.6)
Mean Plasma Glucose: 108.28 mg/dL

## 2017-11-14 SURGERY — CYSTOSCOPY, WITH STENT INSERTION
Anesthesia: General | Laterality: Left

## 2017-11-14 MED ORDER — MORPHINE SULFATE (PF) 4 MG/ML IV SOLN
4.0000 mg | Freq: Once | INTRAVENOUS | Status: AC
  Administered 2017-11-14: 4 mg via INTRAVENOUS

## 2017-11-14 MED ORDER — HYDROMORPHONE HCL 1 MG/ML IJ SOLN
0.5000 mg | Freq: Once | INTRAMUSCULAR | Status: AC
  Administered 2017-11-14 (×3): 0.5 mg via INTRAVENOUS

## 2017-11-14 MED ORDER — PROPOFOL 10 MG/ML IV BOLUS
INTRAVENOUS | Status: DC | PRN
  Administered 2017-11-14: 70 mg via INTRAVENOUS

## 2017-11-14 MED ORDER — ONDANSETRON HCL 4 MG PO TABS: 4.0000 mg | ORAL_TABLET | Freq: Four times a day (QID) | ORAL | Status: DC | PRN

## 2017-11-14 MED ORDER — VITAMIN D 1000 UNITS PO TABS
2000.0000 [IU] | ORAL_TABLET | Freq: Every evening | ORAL | Status: DC
  Administered 2017-11-14: 2000 [IU] via ORAL
  Filled 2017-11-14: qty 2

## 2017-11-14 MED ORDER — ONDANSETRON HCL 4 MG/2ML IJ SOLN
INTRAMUSCULAR | Status: DC | PRN
  Administered 2017-11-14: 4 mg via INTRAVENOUS

## 2017-11-14 MED ORDER — FENTANYL CITRATE (PF) 100 MCG/2ML IJ SOLN
INTRAMUSCULAR | Status: AC
  Filled 2017-11-14: qty 2

## 2017-11-14 MED ORDER — DEXAMETHASONE SODIUM PHOSPHATE 10 MG/ML IJ SOLN
INTRAMUSCULAR | Status: DC | PRN
Start: 2017-11-14 — End: 2017-11-14
  Administered 2017-11-14: 10 mg via INTRAVENOUS

## 2017-11-14 MED ORDER — CEFAZOLIN SODIUM-DEXTROSE 2-4 GM/100ML-% IV SOLN
INTRAVENOUS | Status: AC
  Filled 2017-11-14: qty 100

## 2017-11-14 MED ORDER — ONDANSETRON HCL 4 MG/2ML IJ SOLN
INTRAMUSCULAR | Status: AC
  Filled 2017-11-14: qty 2

## 2017-11-14 MED ORDER — ONDANSETRON HCL 4 MG/2ML IJ SOLN
INTRAMUSCULAR | Status: AC
  Administered 2017-11-14: 4 mg via INTRAVENOUS
  Filled 2017-11-14: qty 2

## 2017-11-14 MED ORDER — ONDANSETRON HCL 4 MG/2ML IJ SOLN: 4.0000 mg | Freq: Once | INTRAMUSCULAR | Status: DC | PRN

## 2017-11-14 MED ORDER — ONDANSETRON HCL 4 MG/2ML IJ SOLN
4.0000 mg | Freq: Four times a day (QID) | INTRAMUSCULAR | Status: DC | PRN
  Administered 2017-11-14: 15:00:00 4 mg via INTRAVENOUS
  Filled 2017-11-14: qty 2

## 2017-11-14 MED ORDER — HYDROMORPHONE HCL 1 MG/ML IJ SOLN
INTRAMUSCULAR | Status: AC
  Administered 2017-11-14: 0.5 mg via INTRAVENOUS
  Filled 2017-11-14: qty 1

## 2017-11-14 MED ORDER — LIDOCAINE HCL (PF) 2 % IJ SOLN
INTRAMUSCULAR | Status: AC
  Filled 2017-11-14: qty 10

## 2017-11-14 MED ORDER — FENTANYL CITRATE (PF) 100 MCG/2ML IJ SOLN: 25.0000 ug | INTRAMUSCULAR | Status: DC | PRN

## 2017-11-14 MED ORDER — PROPOFOL 10 MG/ML IV BOLUS
INTRAVENOUS | Status: AC
  Filled 2017-11-14: qty 20

## 2017-11-14 MED ORDER — LIDOCAINE HCL (CARDIAC) 20 MG/ML IV SOLN
INTRAVENOUS | Status: DC | PRN
Start: 2017-11-14 — End: 2017-11-14
  Administered 2017-11-14: 100 mg via INTRAVENOUS

## 2017-11-14 MED ORDER — PROCHLORPERAZINE EDISYLATE 5 MG/ML IJ SOLN
10.0000 mg | Freq: Four times a day (QID) | INTRAMUSCULAR | Status: DC | PRN
  Filled 2017-11-14: qty 2

## 2017-11-14 MED ORDER — BISACODYL 10 MG RE SUPP: 10.0000 mg | Freq: Every day | RECTAL | Status: DC | PRN

## 2017-11-14 MED ORDER — SODIUM CHLORIDE 0.9 % IV SOLN
Freq: Once | INTRAVENOUS | Status: AC
  Administered 2017-11-14: via INTRAVENOUS

## 2017-11-14 MED ORDER — ONDANSETRON HCL 4 MG/2ML IJ SOLN
4.0000 mg | Freq: Once | INTRAMUSCULAR | Status: AC
  Administered 2017-11-14: 4 mg via INTRAVENOUS

## 2017-11-14 MED ORDER — HYDROMORPHONE HCL 1 MG/ML IJ SOLN
1.0000 mg | INTRAMUSCULAR | Status: DC | PRN
  Administered 2017-11-14 (×2): 1 mg via INTRAVENOUS
  Filled 2017-11-14 (×2): qty 1

## 2017-11-14 MED ORDER — SUCCINYLCHOLINE CHLORIDE 20 MG/ML IJ SOLN
INTRAMUSCULAR | Status: DC | PRN
  Administered 2017-11-14: 60 mg via INTRAVENOUS

## 2017-11-14 MED ORDER — ACETAMINOPHEN 325 MG PO TABS: 650.0000 mg | ORAL_TABLET | Freq: Four times a day (QID) | ORAL | Status: DC | PRN

## 2017-11-14 MED ORDER — POLYETHYLENE GLYCOL 3350 17 G PO PACK: 17.0000 g | PACK | ORAL | Status: DC | PRN

## 2017-11-14 MED ORDER — ENSURE ENLIVE PO LIQD
237.0000 mL | Freq: Three times a day (TID) | ORAL | Status: DC
  Administered 2017-11-14: 20:00:00 237 mL via ORAL

## 2017-11-14 MED ORDER — ROCURONIUM BROMIDE 100 MG/10ML IV SOLN
INTRAVENOUS | Status: DC | PRN
  Administered 2017-11-14: 5 mg via INTRAVENOUS

## 2017-11-14 MED ORDER — HYDROMORPHONE HCL 1 MG/ML IJ SOLN
0.5000 mg | Freq: Once | INTRAMUSCULAR | Status: AC
  Administered 2017-11-14: 0.5 mg via INTRAVENOUS

## 2017-11-14 MED ORDER — DEXAMETHASONE SODIUM PHOSPHATE 10 MG/ML IJ SOLN
INTRAMUSCULAR | Status: AC
  Filled 2017-11-14: qty 1

## 2017-11-14 MED ORDER — PHENYLEPHRINE HCL 10 MG/ML IJ SOLN
INTRAMUSCULAR | Status: DC | PRN
Start: 2017-11-14 — End: 2017-11-14
  Administered 2017-11-14 (×2): 200 ug via INTRAVENOUS
  Administered 2017-11-14: 100 ug via INTRAVENOUS

## 2017-11-14 MED ORDER — ACETAMINOPHEN 650 MG RE SUPP
650.0000 mg | Freq: Four times a day (QID) | RECTAL | Status: DC | PRN
Start: 2017-11-14 — End: 2017-11-15

## 2017-11-14 MED ORDER — FENTANYL CITRATE (PF) 100 MCG/2ML IJ SOLN
INTRAMUSCULAR | Status: DC | PRN
  Administered 2017-11-14: 50 ug via INTRAVENOUS

## 2017-11-14 MED ORDER — MORPHINE SULFATE (PF) 4 MG/ML IV SOLN
INTRAVENOUS | Status: AC
  Administered 2017-11-14: 4 mg via INTRAVENOUS
  Filled 2017-11-14: qty 1

## 2017-11-14 MED ORDER — DOCUSATE SODIUM 100 MG PO CAPS
100.0000 mg | ORAL_CAPSULE | Freq: Two times a day (BID) | ORAL | Status: DC
  Administered 2017-11-14 – 2017-11-15 (×3): 100 mg via ORAL
  Filled 2017-11-14 (×3): qty 1

## 2017-11-14 MED ORDER — CEFAZOLIN SODIUM-DEXTROSE 2-4 GM/100ML-% IV SOLN
2.0000 g | Freq: Once | INTRAVENOUS | Status: AC
  Administered 2017-11-14: 2 g via INTRAVENOUS
  Filled 2017-11-14: qty 100

## 2017-11-14 MED ORDER — MORPHINE SULFATE (PF) 4 MG/ML IV SOLN
4.0000 mg | INTRAVENOUS | Status: DC | PRN
  Administered 2017-11-14: 10:00:00 4 mg via INTRAVENOUS
  Filled 2017-11-14: qty 1

## 2017-11-14 MED ORDER — OXYCODONE HCL 5 MG PO TABS
5.0000 mg | ORAL_TABLET | Freq: Four times a day (QID) | ORAL | Status: DC | PRN
  Administered 2017-11-14: 11:00:00 5 mg via ORAL
  Filled 2017-11-14: qty 1

## 2017-11-14 MED ORDER — SODIUM CHLORIDE 0.9 % IV SOLN
INTRAVENOUS | Status: DC
  Administered 2017-11-14 (×2): via INTRAVENOUS

## 2017-11-14 MED ORDER — ROCURONIUM BROMIDE 50 MG/5ML IV SOLN
INTRAVENOUS | Status: AC
  Filled 2017-11-14: qty 1

## 2017-11-14 MED ORDER — POLYETHYLENE GLYCOL 3350 17 G PO PACK
17.0000 g | PACK | Freq: Every day | ORAL | Status: DC
  Administered 2017-11-15: 17 g via ORAL
  Filled 2017-11-14: qty 1

## 2017-11-14 SURGICAL SUPPLY — 24 items
BAG DRAIN CYSTO-URO LG1000N (MISCELLANEOUS) ×3 IMPLANT
BOSTON SCIENTIFIC CONTOUR STENT ×3 IMPLANT
BRUSH SCRUB EZ  4% CHG (MISCELLANEOUS)
BRUSH SCRUB EZ 4% CHG (MISCELLANEOUS) IMPLANT
CATH URETL 5X70 OPEN END (CATHETERS) ×3 IMPLANT
CONRAY 43 FOR UROLOGY 50M (MISCELLANEOUS) ×3 IMPLANT
GLIDEWIRE STIFF .35X180X3 HYDR (WIRE) ×3 IMPLANT
GLOVE BIOGEL PI IND STRL 8 (GLOVE) ×1 IMPLANT
GLOVE BIOGEL PI INDICATOR 8 (GLOVE) ×2
GOWN STANDARD XL  REUSABL (MISCELLANEOUS) ×3 IMPLANT
KIT RM TURNOVER CYSTO AR (KITS) ×3 IMPLANT
PACK CYSTO AR (MISCELLANEOUS) ×3 IMPLANT
SENSORWIRE 0.038 NOT ANGLED (WIRE) ×3
SET CYSTO W/LG BORE CLAMP LF (SET/KITS/TRAYS/PACK) ×3 IMPLANT
SOL .9 NS 3000ML IRR  AL (IV SOLUTION) ×2
SOL .9 NS 3000ML IRR UROMATIC (IV SOLUTION) ×1 IMPLANT
STENT URET 6FRX24 CONTOUR (STENTS) IMPLANT
STENT URET 6FRX26 CONTOUR (STENTS) IMPLANT
STENT URETL SOFT 8X22 (Stent) ×3 IMPLANT
SURGILUBE 2OZ TUBE FLIPTOP (MISCELLANEOUS) ×3 IMPLANT
SYR 10ML LL (SYRINGE) ×3 IMPLANT
SYRINGE IRR TOOMEY STRL 70CC (SYRINGE) ×3 IMPLANT
WATER STERILE IRR 1000ML POUR (IV SOLUTION) ×3 IMPLANT
WIRE SENSOR 0.038 NOT ANGLED (WIRE) ×1 IMPLANT

## 2017-11-14 NOTE — Progress Notes (Signed)
Patient ID: Lisa Hayden, female   DOB: May 03, 1930, 83 y.o.   MRN: [id number]  [facility] PROGRESS NOTE  LUNETTE TAPP OVZ:[id number] DOB: January 15, 1930 DOA: 11/13/2017 PCP: Birdie Sons, MD  HPI/Subjective: Patient little confused after procedure.  No further flank and back pain  Objective: Vitals:   11/14/17 1341 11/14/17 1447  BP: 138/80 138/78  Pulse: 88 84  Resp: 11 14  Temp: 98.6 F (37 C) 97.6 F (36.4 C)  SpO2: 95% (!) 86%    Filed Weights   11/13/17 2322 11/14/17 0503 11/14/17 1137  Weight: 51.3 kg (113 lb) 53.1 kg (117 lb 1.6 oz) 53.1 kg (117 lb)    ROS: Review of Systems  Constitutional: Negative for chills and fever.  Eyes: Negative for blurred vision.  Respiratory: Negative for cough and shortness of breath.   Cardiovascular: Negative for chest pain.  Gastrointestinal: Negative for abdominal pain, constipation, diarrhea, nausea and vomiting.  Genitourinary: Negative for dysuria.  Musculoskeletal: Negative for joint pain.  Neurological: Negative for dizziness and headaches.   Exam: Physical Exam  HENT:  Nose: No mucosal edema.  Mouth/Throat: No oropharyngeal exudate or posterior oropharyngeal edema.  Eyes: Conjunctivae, EOM and lids are normal. Pupils are equal, round, and reactive to light.  Neck: No JVD present. Carotid bruit is not present. No edema present. No thyroid mass and no thyromegaly present.  Cardiovascular: S1 normal and S2 normal. Exam reveals no gallop.  No murmur heard. Pulses:      Dorsalis pedis pulses are 2+ on the right side, and 2+ on the left side.  Respiratory: No respiratory distress. She has no wheezes. She has no rhonchi. She has no rales.  GI: Soft. Bowel sounds are normal. There is no tenderness.  Musculoskeletal:       Right ankle: She exhibits no swelling.       Left ankle: She exhibits no swelling.  Lymphadenopathy:    She has no cervical adenopathy.  Neurological: She is alert. No cranial nerve deficit.   Skin: Skin is warm. No rash noted. Nails show no clubbing.  Psychiatric:  Some confusion      Data Reviewed: Basic Metabolic Panel: Recent Labs  Lab 11/13/17 1542 11/14/17 0006  NA  --  132*  K  --  3.9  CL  --  98*  CO2  --  24  GLUCOSE  --  175*  BUN  --  22*  CREATININE 1.20* 1.18*  CALCIUM  --  9.1   Liver Function Tests: Recent Labs  Lab 11/14/17 0006  AST 23  ALT 13*  ALKPHOS 61  BILITOT 1.0  PROT 7.5  ALBUMIN 4.1   CBC: Recent Labs  Lab 11/14/17 0006  WBC 7.2  HGB 12.2  HCT 36.5  MCV 92.6  PLT 237     Studies: Ct Abdomen Pelvis W Contrast  Result Date: 11/14/2017 CLINICAL DATA:  83 year old female with complaint of right-sided flank pain radiating. Diagnosed with ovarian cancer in February 2018. EXAM: CT ABDOMEN AND PELVIS WITH CONTRAST TECHNIQUE: Multidetector CT imaging of the abdomen and pelvis was performed using the standard protocol following bolus administration of intravenous contrast. CONTRAST:  70mL ISOVUE-300 IOPAMIDOL (ISOVUE-300) INJECTION 61% COMPARISON:  CT the abdomen and pelvis 09/30/2017. FINDINGS: Lower chest: Several new areas of small pulmonary nodules scattered throughout both lung bases, measuring up to 6 mm on the left (axial image 12 of series 4), nonspecific, but suspicious for potential metastatic disease. Hepatobiliary: Interval increase in number and size  of what are now innumerable ill-defined hypovascular liver lesions scattered throughout all aspects of the liver, with the largest of these currently measuring up to 2 cm in diameter between segments 4B and 5 (axial image 27 of series 2), previously only 11 mm on 09/30/2017, most compatible with progressive metastatic disease. No intra or extrahepatic biliary ductal dilatation. Numerous calcified gallstones lying dependently in the gallbladder. No findings to suggest an acute cholecystitis at this time. Pancreas: No pancreatic mass. No pancreatic ductal dilatation. No pancreatic  or peripancreatic fluid or inflammatory changes. Spleen: Unremarkable. Adrenals/Urinary Tract: Mild thickening of the adrenal glands bilaterally without definite dominant nodule. Right kidney is normal in appearance. New moderate left-sided hydroureteronephrosis which appears related to obstruction of the middle third of the left ureter by what appears to be a retroperitoneal nodal mass (see discussion below). Urinary bladder is normal in appearance. Stomach/Bowel: Normal appearance of the stomach. No pathologic dilatation of small bowel or colon. The appendix is not confidently identified and may be surgically absent. Regardless, there are no inflammatory changes noted adjacent to the cecum to suggest the presence of an acute appendicitis at this time. Vascular/Lymphatic: Aortic atherosclerosis, without evidence of aneurysm or dissection in the abdominal or pelvic vasculature. Malignant appearing soft tissue along the left pelvic side wall causing obstruction of the middle third of the left ureter (axial image 48 of series 2) measuring 2.2 x 1.5 cm, favored to represent a metastatic lymph node. Additional enhancing soft tissue in the upper left retroperitoneum adjacent to the left renal hilum measuring 1.3 cm in short axis (previous only only 1 cm) on axial image 24 of series 2, also suspicious for metastatic lymphadenopathy. Reproductive: Status post total abdominal hysterectomy and bilateral salpingo-oophorectomy. Other: Status post omentectomy. No significant volume of ascites. No pneumoperitoneum. Musculoskeletal: There are no aggressive appearing lytic or blastic lesions noted in the visualized portions of the skeleton. IMPRESSION: 1. Today's study demonstrates progressive metastatic disease, with increased number and size of innumerable hepatic metastases, interval development of small pulmonary nodules throughout the lung bases which are also suspicious for metastatic lesions, and worsening left-sided pelvic  and retroperitoneal lymphadenopathy. 2. New moderate left-sided hydroureteronephrosis which appears related to obstruction of the middle third of the left ureter related to the left pelvic lymphadenopathy. 3. Aortic atherosclerosis. Aortic Atherosclerosis (ICD10-I70.0). Electronically Signed   By: Vinnie Langton M.D.   On: 11/14/2017 08:26    Scheduled Meds: . cholecalciferol  2,000 Units Oral QPM  . docusate sodium  100 mg Oral BID  . feeding supplement (ENSURE ENLIVE)  237 mL Oral TID BM  . polyethylene glycol  17 g Oral Daily   Continuous Infusions: . sodium chloride 75 mL/hr at 11/14/17 0555    Assessment/Plan:  1. Abdominal pain, with new left hydronephrosis and hydroureter.  Patient had elective cystoscopy and left ureteral stent placement.  No further abdominal pain. 2. Metastatic ovarian cancer.  Patient requested to be a DNR.  Case discussed with Dr. Grayland Ormond.  Overall prognosis is poor.  Dr. Grayland Ormond willing to do palliative chemotherapy as outpatient if the patient requests.  Dr. Domingo Cocking again wrote in his note consider palliative consultation. 3. Chronic kidney disease stage III 4. History of osteoporosis  Code Status:     Code Status Orders  (From admission, onward)        Start     Ordered   11/14/17 1455  Do not attempt resuscitation (DNR)  Continuous    Question Answer Comment  In the  event of cardiac or respiratory ARREST Do not call a "code blue"   In the event of cardiac or respiratory ARREST Do not perform Intubation, CPR, defibrillation or ACLS   In the event of cardiac or respiratory ARREST Use medication by any route, position, wound care, and other measures to relive pain and suffering. May use oxygen, suction and manual treatment of airway obstruction as needed for comfort.   Comments nurse may pronounce      11/14/17 1454    Code Status History    Date Active Date Inactive Code Status Order ID Comments User Context   11/14/2017 04:59 11/14/2017  14:54 Full Code [id number]  Harrie Foreman, MD Inpatient   01/16/2017 01:54 01/22/2017 03:20 Full Code [id number]  Lance Coon, MD Inpatient    Advance Directive Documentation     Most Recent Value  Type of Advance Directive  Healthcare Power of Attorney, Living will  Pre-existing out of facility DNR order (yellow form or pink MOST form)  No data  "MOST" Form in Place?  No data     Family Communication: Husband at bedside Disposition Plan: To be determined  Consultants:  Oncology  Procedures: -Left ureteral stent placement  Time spent: 45 minutes including palliative care time  [employer]

## 2017-11-14 NOTE — H&P (Signed)
Lisa Hayden is an 83 y.o. female.    Chief Complaint: Vomiting HPI: The patient with past medical history of hypertension and ovarian cancer presents to the emergency department complaining of nausea.  The patient also reports excruciating colicky left flank pain.  She was given multiple doses of IV narcotic for analgesia in the emergency department with little improvement in her pain.  Her CT of the abdomen was not yet interpreted by radiology but clearly shows hydronephrosis as well as hypodense lesions in the liver and masses throughout the abdomen consistent with peritoneal carcinomatosis.  This is a known diagnosis but due to the patient's intractable pain the emergency department staff called the hospitalist service for admission.  Past Medical History:  Diagnosis Date  . Cancer [facility])    peritoneal carcinoma  . Hypertension   . Osteoporosis     Past Surgical History:  Procedure Laterality Date  . 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 APPENDECTOMY  1953  . BREAST LUMPECTOMY Left 10/29/94   negative nodes. Treated with radiation only  . BREAST SURGERY    . BUNIONECTOMY  1979  . CATARACT EXTRACTION, BILATERAL     left 07/29/2012; right 06/24/2012  . COLON SURGERY  05/1991   secondary to cancer   . ROTATOR CUFF REPAIR Right 01/25/10  . TONSILLECTOMY  1938   and adenoidectomy    Family History  Problem Relation Age of Onset  . CVA Mother   . Congestive Heart Failure Mother   . Diabetes Sister   . Asthma Father    Social History:  reports that she has quit smoking. Her smoking use included cigarettes. she has never used smokeless tobacco. She reports that she drinks about 4.2 oz of alcohol per week. She reports that she does not use drugs.  Allergies:  Allergies  Allergen Reactions  . Actonel [Risedronate Sodium] Other (See Comments)    headache  . Cortisone     very alert and did not sleep for 24 hrs  . Daypro   [Oxaprozin]   . Meloxicam   . Premarin [Conjugated Estrogens]     bleeding  . Prempro [Conj Estrog-Medroxyprogest Ace]     Medications Prior to Admission  Medication Sig Dispense Refill  . alendronate (FOSAMAX) 70 MG tablet Take 1 tablet (70 mg total) by mouth once a week. On Sunday 12 tablet 4  . cholecalciferol (VITAMIN D) 1000 UNITS tablet Take 2,000 Units by mouth every evening. With dinner     . ondansetron (ZOFRAN) 8 MG tablet Take 1 tablet (8 mg total) by mouth every 8 (eight) hours as needed for nausea or vomiting. 60 tablet 1  . oxyCODONE (OXY IR/ROXICODONE) 5 MG immediate release tablet Take 1 tablet (5 mg total) by mouth every 6 (six) hours as needed for severe pain. 30 tablet 0  . polyethylene glycol (MIRALAX / GLYCOLAX) packet Take 17 g by mouth as needed.    . prochlorperazine (COMPAZINE) 10 MG tablet Take 1 tablet (10 mg total) by mouth every 6 (six) hours as needed for nausea or vomiting. (Patient not taking: Reported on 06/26/2017) 60 tablet 0    Results for orders placed or performed during the hospital encounter of 11/13/17 (from the past 48 hour(s))  CBC     Status: None   Collection Time: 11/14/17 12:06 AM  Result Value Ref Range   WBC 7.2 3.6 - 11.0 K/uL   RBC 3.94 3.80 - 5.20 MIL/uL  Hemoglobin 12.2 12.0 - 16.0 g/dL   HCT 36.5 35.0 - 47.0 %   MCV 92.6 80.0 - 100.0 fL   MCH 30.9 26.0 - 34.0 pg   MCHC 33.3 32.0 - 36.0 g/dL   RDW 13.6 11.5 - 14.5 %   Platelets 237 150 - 440 K/uL    Comment: Performed at [facility], [address] [place], [place] [address]  Comprehensive metabolic panel     Status: Abnormal   Collection Time: 11/14/17 12:06 AM  Result Value Ref Range   Sodium 132 (L) 135 - 145 mmol/L   Potassium 3.9 3.5 - 5.1 mmol/L   Chloride 98 (L) 101 - 111 mmol/L   CO2 24 22 - 32 mmol/L   Glucose, Bld 175 (H) 65 - 99 mg/dL   BUN 22 (H) 6 - 20 mg/dL   Creatinine, Ser 1.18 (H) 0.44 - 1.00 mg/dL   Calcium 9.1 8.9 - 10.3 mg/dL   Total Protein  7.5 6.5 - 8.1 g/dL   Albumin 4.1 3.5 - 5.0 g/dL   AST 23 15 - 41 U/L   ALT 13 (L) 14 - 54 U/L   Alkaline Phosphatase 61 38 - 126 U/L   Total Bilirubin 1.0 0.3 - 1.2 mg/dL   GFR calc non Af Amer 41 (L) >60 mL/min   GFR calc Af Amer 47 (L) >60 mL/min    Comment: (NOTE) The eGFR has been calculated using the CKD EPI equation. This calculation has not been validated in all clinical situations. eGFR's persistently <60 mL/min signify possible Chronic Kidney Disease.    Anion gap 10 5 - 15    Comment: Performed at [facility], [address] [place], [place] [address]  Urinalysis, Complete w Microscopic     Status: Abnormal   Collection Time: 11/14/17 12:34 AM  Result Value Ref Range   Color, Urine YELLOW (A) YELLOW   APPearance CLEAR (A) CLEAR   Specific Gravity, Urine 1.029 1.005 - 1.030   pH 6.0 5.0 - 8.0   Glucose, UA 150 (A) NEGATIVE mg/dL   Hgb urine dipstick NEGATIVE NEGATIVE   Bilirubin Urine NEGATIVE NEGATIVE   Ketones, ur 20 (A) NEGATIVE mg/dL   Protein, ur NEGATIVE NEGATIVE mg/dL   Nitrite NEGATIVE NEGATIVE   Leukocytes, UA TRACE (A) NEGATIVE   RBC / HPF 0-5 0 - 5 RBC/hpf   WBC, UA 0-5 0 - 5 WBC/hpf   Bacteria, UA NONE SEEN NONE SEEN   Squamous Epithelial / LPF NONE SEEN NONE SEEN    Comment: Performed at [facility], [address] [place], [place] [address]   No results found.  Review of Systems  Constitutional: Negative for chills and fever.  HENT: Negative for sore throat and tinnitus.   Eyes: Negative for blurred vision and redness.  Respiratory: Negative for cough and shortness of breath.   Cardiovascular: Negative for chest pain, palpitations, orthopnea and PND.  Gastrointestinal: Positive for abdominal pain. Negative for diarrhea, nausea and vomiting.  Genitourinary: Negative for dysuria, frequency and urgency.  Musculoskeletal: Negative for joint pain and myalgias.  Skin: Negative for rash.       No lesions  Neurological: Negative  for speech change, focal weakness and weakness.  Endo/Heme/Allergies: Does not bruise/bleed easily.       No temperature intolerance  Psychiatric/Behavioral: Negative for depression and suicidal ideas.    Blood pressure 140/72, pulse 80, temperature (!) 97.4 F (36.3 C), temperature source Oral, resp. rate 16, height 5' 2"  (  1.575 m), weight 53.1 kg (117 lb 1.6 oz), SpO2 96 %. Physical Exam  Vitals reviewed. Constitutional: She is oriented to person, place, and time. She appears well-developed and well-nourished. No distress.  HENT:  Head: Normocephalic and atraumatic.  Mouth/Throat: Oropharynx is clear and moist.  Eyes: Conjunctivae and EOM are normal. Pupils are equal, round, and reactive to light. No scleral icterus.  Neck: Normal range of motion. Neck supple. No JVD present. No tracheal deviation present. No thyromegaly present.  Cardiovascular: Normal rate, regular rhythm and normal heart sounds. Exam reveals no gallop and no friction rub.  No murmur heard. Respiratory: Effort normal and breath sounds normal.  GI: Soft. Bowel sounds are normal. She exhibits no distension. There is no tenderness.  Genitourinary:  Genitourinary Comments: Deferred  Musculoskeletal: Normal range of motion. She exhibits no edema.  Lymphadenopathy:    She has no cervical adenopathy.  Neurological: She is alert and oriented to person, place, and time. No cranial nerve deficit. She exhibits normal muscle tone.  Skin: Skin is warm and dry. No rash noted. No erythema.  Psychiatric: She has a normal mood and affect. Her behavior is normal. Judgment and thought content normal.     Assessment/Plan This is an 83 year old female admitted for intractable abdominal pain. 1.  Abdominal pain: Intractable; needs IV narcotics for metastatic cancer.  Morphine as needed for moderate pain.  Dilaudid for severe pain.  Oxycodone immediate release for breakthrough pain. 2.  Peritoneal carcinomatosis: Secondary to  metastatic ovarian cancer.  Consult oncology for further guidance. 3.  Obstructive uropathy: Secondary to above.  Consult urology for possible stent placement for hydronephrosis secondary to malignant obstruction of left ureter. 4.  CKD: Stage III; nephrotoxic agents.  Hydrate with intravenous fluid. 5.  DVT prophylaxis: SCDs 6.  GI prophylaxis: None The patient is a full code.  Time spent on admission orders and patient care approximately 45 minutes  Harrie Foreman, MD 11/14/2017, 5:56 AM

## 2017-11-14 NOTE — Consult Note (Signed)
Urology Consult  I have been asked to see the patient by Dr. Marcille Blanco, for evaluation and management of new onset left hydronephrosis.  Chief Complaint: Flank pain  History of Present Illness: Lisa Hayden is a 83 y.o. year old with metastatic ovarian cancer who presented to the ED last night with onset of severe, colicky left flank pain.  The pain was nonradiating.  There are no identifiable precipitating, aggravating or alleviating factors.  She had associated nausea and vomiting.  She denied fever or chills.  She received multiple doses of IV narcotics in the ED with minimal pain improvement.  CT of the abdomen pelvis showed moderate left hydronephrosis and proximal hydroureter with apparent extrinsic obstruction secondary to retroperitoneal mass/adenopathy.  Past Medical History:  Diagnosis Date  . 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 Hypertension   . Osteoporosis     Past Surgical History:  Procedure Laterality Date  . 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 APPENDECTOMY  1953  . BREAST LUMPECTOMY Left 10/29/94   negative nodes. Treated with radiation only  . BREAST SURGERY    . BUNIONECTOMY  1979  . CATARACT EXTRACTION, BILATERAL     left 07/29/2012; right 06/24/2012  . COLON SURGERY  05/1991   secondary to cancer   . ROTATOR CUFF REPAIR Right 01/25/10  . TONSILLECTOMY  1938   and adenoidectomy    Home Medications:  Current Meds  Medication Sig  . alendronate (FOSAMAX) 70 MG tablet Take 1 tablet (70 mg total) by mouth once a week. On Sunday  . cholecalciferol (VITAMIN D) 1000 UNITS tablet Take 2,000 Units by mouth every evening. With dinner   . ondansetron (ZOFRAN) 8 MG tablet Take 1 tablet (8 mg total) by mouth every 8 (eight) hours as needed for nausea or vomiting.  Marland Kitchen oxyCODONE (OXY IR/ROXICODONE) 5 MG immediate release tablet Take 1 tablet (5 mg total) by mouth every 6 (six) hours as needed for severe pain.  .  polyethylene glycol (MIRALAX / GLYCOLAX) packet Take 17 g by mouth as needed.    Allergies:  Allergies  Allergen Reactions  . Actonel [Risedronate Sodium] Other (See Comments)    headache  . Cortisone     very alert and did not sleep for 24 hrs  . Daypro  [Oxaprozin]   . Meloxicam   . Premarin [Conjugated Estrogens]     bleeding  . Prempro [Conj Estrog-Medroxyprogest Ace]     Family History  Problem Relation Age of Onset  . CVA Mother   . Congestive Heart Failure Mother   . Diabetes Sister   . Asthma Father     Social History:  reports that she has quit smoking. Her smoking use included cigarettes. she has never used smokeless tobacco. She reports that she drinks about 4.2 oz of alcohol per week. She reports that she does not use drugs.  ROS: A complete review of systems was performed.  All systems are negative except for pertinent findings as noted.  Physical Exam:  Vital signs in last 24 hours: Temp:  [96.9 F (36.1 C)-97.4 F (36.3 C)] 97.4 F (36.3 C) (12/27 0503) Pulse Rate:  [80-93] 80 (12/27 0503) Resp:  [16-20] 16 (12/27 0503) BP: (140-173)/(69-113) 140/72 (12/27 0503) SpO2:  [94 %-98 %] 96 % (12/27 0503) Weight:  [113 lb (51.3 kg)-117 lb 1.6 oz (53.1 kg)] 117 lb 1.6 oz (53.1 kg) (12/27 0503) Constitutional:  Alert  and oriented, No acute distress HEENT: [place] AT, moist mucus membranes.  Trachea midline, no masses Cardiovascular: Regular rate and rhythm, no clubbing, cyanosis, or edema. Respiratory: Normal respiratory effort, lungs clear bilaterally GI: Abdomen is soft, nontender, nondistended, no abdominal masses GU: Positive left CVA tenderness Skin: No rashes, bruises or suspicious lesions Lymph: No cervical or inguinal adenopathy Neurologic: Grossly intact, no focal deficits, moving all 4 extremities Psychiatric: Normal mood and affect   Laboratory Data:  Recent Labs    11/14/17 0006  WBC 7.2  HGB 12.2  HCT 36.5   Recent Labs    11/13/17 1542  11/14/17 0006  NA  --  132*  K  --  3.9  CL  --  98*  CO2  --  24  GLUCOSE  --  175*  BUN  --  22*  CREATININE 1.20* 1.18*  CALCIUM  --  9.1    Radiologic Imaging: Ct Abdomen Pelvis W Contrast  Result Date: 11/14/2017 CLINICAL DATA:  83 year old female with complaint of right-sided flank pain radiating. Diagnosed with ovarian cancer in February 2018. EXAM: CT ABDOMEN AND PELVIS WITH CONTRAST TECHNIQUE: Multidetector CT imaging of the abdomen and pelvis was performed using the standard protocol following bolus administration of intravenous contrast. CONTRAST:  51mL ISOVUE-300 IOPAMIDOL (ISOVUE-300) INJECTION 61% COMPARISON:  CT the abdomen and pelvis 09/30/2017. FINDINGS: Lower chest: Several new areas of small pulmonary nodules scattered throughout both lung bases, measuring up to 6 mm on the left (axial image 12 of series 4), nonspecific, but suspicious for potential metastatic disease. Hepatobiliary: Interval increase in number and size of what are now innumerable ill-defined hypovascular liver lesions scattered throughout all aspects of the liver, with the largest of these currently measuring up to 2 cm in diameter between segments 4B and 5 (axial image 27 of series 2), previously only 11 mm on 09/30/2017, most compatible with progressive metastatic disease. No intra or extrahepatic biliary ductal dilatation. Numerous calcified gallstones lying dependently in the gallbladder. No findings to suggest an acute cholecystitis at this time. Pancreas: No pancreatic mass. No pancreatic ductal dilatation. No pancreatic or peripancreatic fluid or inflammatory changes. Spleen: Unremarkable. Adrenals/Urinary Tract: Mild thickening of the adrenal glands bilaterally without definite dominant nodule. Right kidney is normal in appearance. New moderate left-sided hydroureteronephrosis which appears related to obstruction of the middle third of the left ureter by what appears to be a retroperitoneal nodal mass (see  discussion below). Urinary bladder is normal in appearance. Stomach/Bowel: Normal appearance of the stomach. No pathologic dilatation of small bowel or colon. The appendix is not confidently identified and may be surgically absent. Regardless, there are no inflammatory changes noted adjacent to the cecum to suggest the presence of an acute appendicitis at this time. Vascular/Lymphatic: Aortic atherosclerosis, without evidence of aneurysm or dissection in the abdominal or pelvic vasculature. Malignant appearing soft tissue along the left pelvic side wall causing obstruction of the middle third of the left ureter (axial image 48 of series 2) measuring 2.2 x 1.5 cm, favored to represent a metastatic lymph node. Additional enhancing soft tissue in the upper left retroperitoneum adjacent to the left renal hilum measuring 1.3 cm in short axis (previous only only 1 cm) on axial image 24 of series 2, also suspicious for metastatic lymphadenopathy. Reproductive: Status post total abdominal hysterectomy and bilateral salpingo-oophorectomy. Other: Status post omentectomy. No significant volume of ascites. No pneumoperitoneum. Musculoskeletal: There are no aggressive appearing lytic or blastic lesions noted in the visualized portions of the skeleton.  IMPRESSION: 1. Today's study demonstrates progressive metastatic disease, with increased number and size of innumerable hepatic metastases, interval development of small pulmonary nodules throughout the lung bases which are also suspicious for metastatic lesions, and worsening left-sided pelvic and retroperitoneal lymphadenopathy. 2. New moderate left-sided hydroureteronephrosis which appears related to obstruction of the middle third of the left ureter related to the left pelvic lymphadenopathy. 3. Aortic atherosclerosis. Aortic Atherosclerosis (ICD10-I70.0). Electronically Signed   By: Vinnie Langton M.D.   On: 11/14/2017 08:26    Impression/Assessment:  New left  hydronephrosis/hydroureter secondary to retroperitoneal adenopathy/extrinsic obstruction.  Her pain is severe.  I discussed cystoscopy with placement of a left ureteral stent and she desires to proceed.  Plan:  She has elected to proceed with cystoscopy and placement of a left ureteral stent.  The procedure was discussed in detail including potential risks of bleeding, infection and ureteral injury.  She was informed that if the stent is unable to be placed she would need a percutaneous nephrostomy.  She indicated all questions were answered and desires to proceed.  11/14/2017, 10:41 AM  John Giovanni,  MD

## 2017-11-14 NOTE — Anesthesia Preprocedure Evaluation (Signed)
Anesthesia Evaluation  Patient identified by MRN, date of birth, ID band Patient awake    Reviewed: Allergy & Precautions, H&P , NPO status , Patient's Chart, lab work & pertinent test results, reviewed documented beta blocker date and time   History of Anesthesia Complications Negative for: history of anesthetic complications  Airway Mallampati: I  TM Distance: >3 FB Neck ROM: full    Dental no notable dental hx. (+) Upper Dentures, Dental Advidsory Given   Pulmonary neg pulmonary ROS, former smoker,           Cardiovascular Exercise Tolerance: Good hypertension, (-) angina(-) Past MI (-) dysrhythmias (-) Valvular Problems/Murmurs     Neuro/Psych negative neurological ROS  negative psych ROS   GI/Hepatic negative GI ROS, Neg liver ROS,   Endo/Other  negative endocrine ROS  Renal/GU negative Renal ROS  negative genitourinary   Musculoskeletal   Abdominal   Peds  Hematology negative hematology ROS (+)   Anesthesia Other Findings Past Medical History: No date: Cancer ([place])     Comment:  peritoneal carcinoma No date: Hypertension No date: Osteoporosis   Reproductive/Obstetrics negative OB ROS                             Anesthesia Physical Anesthesia Plan  ASA: III  Anesthesia Plan: General, Rapid Sequence and Cricoid Pressure   Post-op Pain Management:    Induction: Intravenous  PONV Risk Score and Plan: 3 and Ondansetron and Dexamethasone  Airway Management Planned: Oral ETT  Additional Equipment:   Intra-op Plan:   Post-operative Plan: Extubation in OR  Informed Consent: I have reviewed the patients History and Physical, chart, labs and discussed the procedure including the risks, benefits and alternatives for the proposed anesthesia with the patient or authorized representative who has indicated his/her understanding and acceptance.   Dental Advisory Given  Plan  Discussed with: Anesthesiologist, CRNA and Surgeon  Anesthesia Plan Comments:         Anesthesia Quick Evaluation

## 2017-11-14 NOTE — H&P (View-Only) (Signed)
Urology Consult  I have been asked to see the patient by Dr. Marcille Blanco, for evaluation and management of new onset left hydronephrosis.  Chief Complaint: Flank pain  History of Present Illness: Lisa Hayden is a 83 y.o. year old with metastatic ovarian cancer who presented to the ED last night with onset of severe, colicky left flank pain.  The pain was nonradiating.  There are no identifiable precipitating, aggravating or alleviating factors.  She had associated nausea and vomiting.  She denied fever or chills.  She received multiple doses of IV narcotics in the ED with minimal pain improvement.  CT of the abdomen pelvis showed moderate left hydronephrosis and proximal hydroureter with apparent extrinsic obstruction secondary to retroperitoneal mass/adenopathy.  Past Medical History:  Diagnosis Date  . Cancer [facility])    peritoneal carcinoma  . Hypertension   . Osteoporosis     Past Surgical History:  Procedure Laterality Date  . 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 APPENDECTOMY  1953  . BREAST LUMPECTOMY Left 10/29/94   negative nodes. Treated with radiation only  . BREAST SURGERY    . BUNIONECTOMY  1979  . CATARACT EXTRACTION, BILATERAL     left 07/29/2012; right 06/24/2012  . COLON SURGERY  05/1991   secondary to cancer   . ROTATOR CUFF REPAIR Right 01/25/10  . TONSILLECTOMY  1938   and adenoidectomy    Home Medications:  Current Meds  Medication Sig  . alendronate (FOSAMAX) 70 MG tablet Take 1 tablet (70 mg total) by mouth once a week. On Sunday  . cholecalciferol (VITAMIN D) 1000 UNITS tablet Take 2,000 Units by mouth every evening. With dinner   . ondansetron (ZOFRAN) 8 MG tablet Take 1 tablet (8 mg total) by mouth every 8 (eight) hours as needed for nausea or vomiting.  Marland Kitchen oxyCODONE (OXY IR/ROXICODONE) 5 MG immediate release tablet Take 1 tablet (5 mg total) by mouth every 6 (six) hours as needed for severe pain.  .  polyethylene glycol (MIRALAX / GLYCOLAX) packet Take 17 g by mouth as needed.    Allergies:  Allergies  Allergen Reactions  . Actonel [Risedronate Sodium] Other (See Comments)    headache  . Cortisone     very alert and did not sleep for 24 hrs  . Daypro  [Oxaprozin]   . Meloxicam   . Premarin [Conjugated Estrogens]     bleeding  . Prempro [Conj Estrog-Medroxyprogest Ace]     Family History  Problem Relation Age of Onset  . CVA Mother   . Congestive Heart Failure Mother   . Diabetes Sister   . Asthma Father     Social History:  reports that she has quit smoking. Her smoking use included cigarettes. she has never used smokeless tobacco. She reports that she drinks about 4.2 oz of alcohol per week. She reports that she does not use drugs.  ROS: A complete review of systems was performed.  All systems are negative except for pertinent findings as noted.  Physical Exam:  Vital signs in last 24 hours: Temp:  [96.9 F (36.1 C)-97.4 F (36.3 C)] 97.4 F (36.3 C) (12/27 0503) Pulse Rate:  [80-93] 80 (12/27 0503) Resp:  [16-20] 16 (12/27 0503) BP: (140-173)/(69-113) 140/72 (12/27 0503) SpO2:  [94 %-98 %] 96 % (12/27 0503) Weight:  [113 lb (51.3 kg)-117 lb 1.6 oz (53.1 kg)] 117 lb 1.6 oz (53.1 kg) (12/27 0503) Constitutional:  Alert  and oriented, No acute distress HEENT: [place] AT, moist mucus membranes.  Trachea midline, no masses Cardiovascular: Regular rate and rhythm, no clubbing, cyanosis, or edema. Respiratory: Normal respiratory effort, lungs clear bilaterally GI: Abdomen is soft, nontender, nondistended, no abdominal masses GU: Positive left CVA tenderness Skin: No rashes, bruises or suspicious lesions Lymph: No cervical or inguinal adenopathy Neurologic: Grossly intact, no focal deficits, moving all 4 extremities Psychiatric: Normal mood and affect   Laboratory Data:  Recent Labs    11/14/17 0006  WBC 7.2  HGB 12.2  HCT 36.5   Recent Labs    11/13/17 1542  11/14/17 0006  NA  --  132*  K  --  3.9  CL  --  98*  CO2  --  24  GLUCOSE  --  175*  BUN  --  22*  CREATININE 1.20* 1.18*  CALCIUM  --  9.1    Radiologic Imaging: Ct Abdomen Pelvis W Contrast  Result Date: 11/14/2017 CLINICAL DATA:  83 year old female with complaint of right-sided flank pain radiating. Diagnosed with ovarian cancer in February 2018. EXAM: CT ABDOMEN AND PELVIS WITH CONTRAST TECHNIQUE: Multidetector CT imaging of the abdomen and pelvis was performed using the standard protocol following bolus administration of intravenous contrast. CONTRAST:  76mL ISOVUE-300 IOPAMIDOL (ISOVUE-300) INJECTION 61% COMPARISON:  CT the abdomen and pelvis 09/30/2017. FINDINGS: Lower chest: Several new areas of small pulmonary nodules scattered throughout both lung bases, measuring up to 6 mm on the left (axial image 12 of series 4), nonspecific, but suspicious for potential metastatic disease. Hepatobiliary: Interval increase in number and size of what are now innumerable ill-defined hypovascular liver lesions scattered throughout all aspects of the liver, with the largest of these currently measuring up to 2 cm in diameter between segments 4B and 5 (axial image 27 of series 2), previously only 11 mm on 09/30/2017, most compatible with progressive metastatic disease. No intra or extrahepatic biliary ductal dilatation. Numerous calcified gallstones lying dependently in the gallbladder. No findings to suggest an acute cholecystitis at this time. Pancreas: No pancreatic mass. No pancreatic ductal dilatation. No pancreatic or peripancreatic fluid or inflammatory changes. Spleen: Unremarkable. Adrenals/Urinary Tract: Mild thickening of the adrenal glands bilaterally without definite dominant nodule. Right kidney is normal in appearance. New moderate left-sided hydroureteronephrosis which appears related to obstruction of the middle third of the left ureter by what appears to be a retroperitoneal nodal mass (see  discussion below). Urinary bladder is normal in appearance. Stomach/Bowel: Normal appearance of the stomach. No pathologic dilatation of small bowel or colon. The appendix is not confidently identified and may be surgically absent. Regardless, there are no inflammatory changes noted adjacent to the cecum to suggest the presence of an acute appendicitis at this time. Vascular/Lymphatic: Aortic atherosclerosis, without evidence of aneurysm or dissection in the abdominal or pelvic vasculature. Malignant appearing soft tissue along the left pelvic side wall causing obstruction of the middle third of the left ureter (axial image 48 of series 2) measuring 2.2 x 1.5 cm, favored to represent a metastatic lymph node. Additional enhancing soft tissue in the upper left retroperitoneum adjacent to the left renal hilum measuring 1.3 cm in short axis (previous only only 1 cm) on axial image 24 of series 2, also suspicious for metastatic lymphadenopathy. Reproductive: Status post total abdominal hysterectomy and bilateral salpingo-oophorectomy. Other: Status post omentectomy. No significant volume of ascites. No pneumoperitoneum. Musculoskeletal: There are no aggressive appearing lytic or blastic lesions noted in the visualized portions of the skeleton.  IMPRESSION: 1. Today's study demonstrates progressive metastatic disease, with increased number and size of innumerable hepatic metastases, interval development of small pulmonary nodules throughout the lung bases which are also suspicious for metastatic lesions, and worsening left-sided pelvic and retroperitoneal lymphadenopathy. 2. New moderate left-sided hydroureteronephrosis which appears related to obstruction of the middle third of the left ureter related to the left pelvic lymphadenopathy. 3. Aortic atherosclerosis. Aortic Atherosclerosis (ICD10-I70.0). Electronically Signed   By: Vinnie Langton M.D.   On: 11/14/2017 08:26    Impression/Assessment:  New left  hydronephrosis/hydroureter secondary to retroperitoneal adenopathy/extrinsic obstruction.  Her pain is severe.  I discussed cystoscopy with placement of a left ureteral stent and she desires to proceed.  Plan:  She has elected to proceed with cystoscopy and placement of a left ureteral stent.  The procedure was discussed in detail including potential risks of bleeding, infection and ureteral injury.  She was informed that if the stent is unable to be placed she would need a percutaneous nephrostomy.  She indicated all questions were answered and desires to proceed.  11/14/2017, 10:41 AM  John Giovanni,  MD

## 2017-11-14 NOTE — Op Note (Signed)
Date of procedure: 11/14/17  Preoperative diagnosis:  1. Left ureteral obstruction secondary to metastatic ovarian cancer  Postoperative diagnosis:  1. Same   Procedure: 1. Cystoscopy with placement of left ureteral stent 2. Left retrograde pyelogram  Surgeon: John Giovanni, MD  Anesthesia: General  Complications: None  Intraoperative findings:  Retrograde pyelogram: Moderate left hydronephrosis/hydroureter to the level of the mid ureter  EBL: Minimal  Specimens: None  Drains: 8 French/22 cm double-J ureteral stent  Indication: Lisa Hayden is a 83 y.o. patient with metastatic ovarian cancer presenting with acute left flank pain and CT showing new left hydronephrosis secondary to retroperitoneal adenopathy.  After reviewing the management options for treatment, he elected to proceed with the above surgical procedure(s). We have discussed the potential benefits and risks of the procedure, side effects of the proposed treatment, the likelihood of the patient achieving the goals of the procedure, and any potential problems that might occur during the procedure or recuperation. Informed consent has been obtained.  Description of procedure:  The patient was taken to the operating room and general anesthesia was induced.  The patient was placed in the dorsal lithotomy position, prepped and draped in the usual sterile fashion, and preoperative antibiotics were administered. A preoperative time-out was performed.   A 22 French cystoscope was lubricated and passed per urethra.  Panendoscopy was performed and the bladder mucosa was normal in appearance without erythema, solid or papillary lesions.  The right ureteral orifice was normal in appearance with clear efflux.  No efflux was seen from the left ureteral orifice.  A 6 French open ended ureteral catheter was placed in the cystoscope and positioned at the left ureteral orifice.  A 0.038 sensor wire was then placed through the catheter  and into the left ureteral orifice.  The ureteral catheter was advanced over the wire and retrograde pyelogram was performed with findings as described above.  The Sensor wire was placed back into the ureteral catheter however due to tortuosity of the proximal ureter would not advance into the renal pelvis.  A Glidewire was substituted and was easily passed into the renal pelvis.  The ureteral catheter was advanced to the renal pelvis and repeat retrograde pyelogram demonstrated correct position.  The guidewire was replaced and the ureteral catheter was removed.  An 8 French/22 cm double-J ureteral stent was easily advanced over the wire.  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  The distal end of the stent was well-positioned.  Brisk efflux of clear urine was noted through the stent and drainage holes.  The bladder was emptied and the cystoscope was removed.  After anesthetic reversal she was transported to the PACU in stable condition.   John Giovanni, M.D.

## 2017-11-14 NOTE — Consult Note (Signed)
Patient unavailable for consultation today secondary to stent placement.  CT scan results reviewed independently with rapidly progressing widespread metastatic disease.  Patient was last seen in clinic on October 02, 2017 at which time she felt well and was asymptomatic.  She last received chemotherapy with carboplatinum and Taxol on August 28, 2017.  Patient had a restaging CT scan on September 30, 2017 that reported new hypodense liver lesions, but her left inguinal lymph node is no longer visualized. After lengthy discussion with the patient at that time as well as consultation with gynecology oncology, it was agreed upon not pursue additional chemotherapy.  Now that patient has widespread metastatic disease, she would benefit from palliative chemotherapy.  Given her rapid recurrence, she would be considered platinum resistant.  Have ordered a CA 125 for completeness.  Consider palliative care consult.  I will be in the [facility] clinic all day tomorrow, please contact the on-call physician, Dr. Mike Gip, if there are any questions or concerns.  Patient should follow-up in the cancer center next week after discharge for treatment planning.

## 2017-11-14 NOTE — Anesthesia Procedure Notes (Signed)
Procedure Name: Intubation Date/Time: 11/14/2017 12:02 PM Performed by: Silvana Newness, CRNA Pre-anesthesia Checklist: Patient identified, Emergency Drugs available, Suction available, Patient being monitored and Timeout performed Patient Re-evaluated:Patient Re-evaluated prior to induction Oxygen Delivery Method: Circle system utilized Preoxygenation: Pre-oxygenation with 100% oxygen Induction Type: IV induction, Cricoid Pressure applied and Rapid sequence Laryngoscope Size: Mac and 3 Grade View: Grade I Tube type: Oral Tube size: 7.0 mm Number of attempts: 1 Airway Equipment and Method: Stylet Placement Confirmation: ETT inserted through vocal cords under direct vision,  positive ETCO2 and breath sounds checked- equal and bilateral Secured at: 21 cm Tube secured with: Tape Dental Injury: Teeth and Oropharynx as per pre-operative assessment

## 2017-11-14 NOTE — Progress Notes (Signed)
Patient ID: Lisa Hayden, female   DOB: 1930-04-13, 83 y.o.   MRN: [id number]  ACP note.  Patient and husband at the bedside.  Patient has metastatic ovarian cancer with innumerable lesions on the liver. Patient had a ureteral stent today for obstructive uropathy.  Patient is a little confused afterwards because she received some medications.  Patient would like to be a DO NOT RESUSCITATE.  Patient and husband interested in speaking with Dr. Grayland Ormond about treatment options.  I explained that any treatment at this point will be palliative in nature trying to slow things down.  Time spent on ACP discussion 18 minutes  Dr. Loletha Grayer

## 2017-11-14 NOTE — Interval H&P Note (Signed)
History and Physical Interval Note:  11/14/2017 11:34 AM  Lisa Hayden  has presented today for surgery, with the diagnosis of N/A  The various methods of treatment have been discussed with the patient and family. After consideration of risks, benefits and other options for treatment, the patient has consented to  Procedure(s): [place] (Left) as a surgical intervention .  The patient's history has been reviewed, patient examined, no change in status, stable for surgery.  I have reviewed the patient's chart and labs.  Questions were answered to the patient's satisfaction.     [place]

## 2017-11-14 NOTE — Progress Notes (Signed)
Initial Nutrition Assessment  DOCUMENTATION CODES:   Severe malnutrition in context of chronic illness  INTERVENTION:  Provide Ensure Enlive po TID, each supplement provides 350 kcal and 20 grams of protein.  Encouraged adequate intake of calories and protein at meals.  NUTRITION DIAGNOSIS:   Severe Malnutrition related to chronic illness(metastatic peritoneal carcinomatosis) as evidenced by moderate fat depletion, severe fat depletion, moderate muscle depletion, severe muscle depletion.  GOAL:   Patient will meet greater than or equal to 90% of their needs  MONITOR:   PO intake, Supplement acceptance, Labs, Weight trends, I & O's  REASON FOR ASSESSMENT:   Malnutrition Screening Tool    ASSESSMENT:   83 year old female with PMHx of HTN, OP, metastatic peritoneal carcinomatosis s/p exploratory laparotomy with LOA, SBR and anastomosis, tumor debulking, total abdominal hysterectomy, bilateral salpingo-oophorectomy, and proctoscopy on 01/23/2017 in setting of SBO from peritoneal carcinomatosis. Patient now presents with nausea and left flank pain found to have new left hydronephrosis secondary to retroperitoneal adenopathy s/p placement of left ureteral stent 12/27.   Met with patient and her husband at bedside. Patient reports she has had a decreased appetite but is unsure why. Breakfast is the biggest meal of the day and she usually has cereal with fruit. She also drinks one Ensure daily. She just ordered some oatmeal and yogurt for dinner.  UBW was in the 130s. Per chart she was 136.9 lbs on 01/29/2017. She has lost 19.8 lbs (14.5% body weight) over the past 9 months, which is not significant for time frame.  Medications reviewed and include: vitamin D 2000 units every evening, Colace, Miralax, NS @ 75 mL/hr.  Labs reviewed: Sodium 132, Chloride 98, BUN 22, Creatinine 1.18.  NUTRITION - FOCUSED PHYSICAL EXAM:    Most Recent Value  Orbital Region  Severe depletion  Upper Arm  Region  Moderate depletion  Thoracic and Lumbar Region  Moderate depletion  Buccal Region  Severe depletion  Temple Region  Severe depletion  Clavicle Bone Region  Severe depletion  Clavicle and Acromion Bone Region  Severe depletion  Scapular Bone Region  Moderate depletion  Dorsal Hand  Moderate depletion  Patellar Region  Moderate depletion  Anterior Thigh Region  Moderate depletion  Posterior Calf Region  Moderate depletion  Edema (RD Assessment)  None  Hair  Reviewed  Eyes  Reviewed  Mouth  Reviewed  Skin  Reviewed  Nails  Reviewed     Diet Order:  Diet regular Room service appropriate? Yes; Fluid consistency: Thin  EDUCATION NEEDS:   No education needs have been identified at this time  Skin:  Skin Assessment: Skin Integrity Issues: Skin Integrity Issues:: Incisions Incisions: closed incision perineum  Last BM:  Unknown  Height:   Ht Readings from Last 1 Encounters:  11/14/17 5' 2"  (1.575 m)    Weight:   Wt Readings from Last 1 Encounters:  11/14/17 117 lb (53.1 kg)    Ideal Body Weight:  50 kg  BMI:  Body mass index is 21.4 kg/m.  Estimated Nutritional Needs:   Kcal:  1600-1860 (30-35 kcal/kg)  Protein:  70-80 grams (1.3-1.5 grams/kg)  Fluid:  1.6-1.8 L/day (1 mL/kcal)  Willey Blade, MS, RD, LDN Office: [phone] Pager: [phone] After Hours/Weekend Pager: [phone]

## 2017-11-14 NOTE — ED Notes (Addendum)
Dr Cinda Quest made aware of pt's continued level of pain (10/10) following Morphine administration at 00:20; VORB for 0.5mg  Dilaudid

## 2017-11-14 NOTE — ED Provider Notes (Signed)
[facility] Emergency Department Provider Note   ____________________________________________   First MD Initiated Contact with Patient 11/13/17 2337     (approximate)  I have reviewed the triage vital signs and the nursing notes.   HISTORY  Chief Complaint Emesis    HPI Lisa Hayden is a 83 y.o. female who has been having left lower quadrant abdominal pain.  She had a CT of her abdomen at 3:00 today and went home and then began having some increasing pain and vomiting.  Patient is not running a fever.  Patient has a history of peritoneal carcinomatosis and liver metastases.  I called the radiologist and he reviewed the CT scan for me and reports she has left-sided hydronephrosis due to a mass sitting on top of the left ureter.  She has liver metastases and a lot of stool in her colon but no sign of any obstruction.  She had previous obstruction in the past pain is moderately severe.  Achy in nature he is to be made worse by movement or palpation of her abdomen.   Past Medical History:  Diagnosis Date  . Cancer [facility])    peritoneal carcinoma  . Hypertension   . Osteoporosis     Patient Active Problem List   Diagnosis Date Noted  . Malignant neoplasm of ovary ([place]) 06/26/2017  . 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 Protein-calorie malnutrition, severe 01/18/2017  . Peritoneal carcinomatosis ([place]) 01/16/2017  . SBO (small bowel obstruction) ([place]) 01/16/2017  . Small bowel obstruction ([place])   . Malignant neoplasm of colon ([place]) 05/08/2016  . Reactive airway disease 05/04/2015  . Bronchitis 05/04/2015  . Asthma without status asthmaticus 05/03/2015  . Back ache 05/03/2015  . Body mass index (BMI) of 24.0-24.9 in adult 05/03/2015  . Breast CA ([place]) 05/03/2015  . Blood pressure elevated 05/03/2015  . Foot pain 05/03/2015  . BP (high blood pressure) 05/03/2015  . Leg pain 05/03/2015  . Lichen sclerosus of female genitalia 05/03/2015    . Neutropenia ([place]) 05/03/2015  . Tendinitis of wrist 05/03/2015  . Atrophy of vagina 05/03/2015  . Avitaminosis D 05/03/2015  . CN (constipation) 01/11/2010  . Mechanical and motor problems with internal organs 09/21/2009  . Arthralgia of shoulder 08/31/2009  . Personal history of malignant neoplasm of breast 04/13/2009  . OP (osteoporosis) 04/13/2009    Past Surgical History:  Procedure Laterality Date  . 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 APPENDECTOMY  1953  . BREAST LUMPECTOMY Left 10/29/94   negative nodes. Treated with radiation only  . BREAST SURGERY    . BUNIONECTOMY  1979  . CATARACT EXTRACTION, BILATERAL     left 07/29/2012; right 06/24/2012  . COLON SURGERY  05/1991   secondary to cancer   . ROTATOR CUFF REPAIR Right 01/25/10  . TONSILLECTOMY  1938   and adenoidectomy    Prior to Admission medications   Medication Sig Start Date End Date Taking? Authorizing Provider  alendronate (FOSAMAX) 70 MG tablet Take 1 tablet (70 mg total) by mouth once a week. On Sunday 11/07/16   Birdie Sons, MD  cholecalciferol (VITAMIN D) 1000 UNITS tablet Take 2,000 Units by mouth every evening. With dinner     [provider]  ondansetron (ZOFRAN) 8 MG tablet Take 1 tablet (8 mg total) by mouth every 8 (eight) hours as needed for nausea or vomiting. 11/02/17   Lloyd Huger, MD  oxyCODONE (OXY IR/ROXICODONE) 5 MG immediate release tablet Take 1 tablet (5 mg total) by mouth every 6 (six) hours as needed for severe pain. 11/04/17   Lloyd Huger, MD  polyethylene glycol (MIRALAX / GLYCOLAX) packet Take 17 g by mouth as needed.    [provider]  prochlorperazine (COMPAZINE) 10 MG tablet Take 1 tablet (10 mg total) by mouth every 6 (six) hours as needed for nausea or vomiting. Patient not taking: Reported on 06/26/2017 03/06/17   Lloyd Huger, MD    Allergies Actonel [risedronate sodium]; Cortisone;  Daypro  [oxaprozin]; Meloxicam; Premarin [conjugated estrogens]; and Prempro [conj estrog-medroxyprogest ace]  Family History  Problem Relation Age of Onset  . CVA Mother   . Congestive Heart Failure Mother   . Diabetes Sister   . Asthma Father     Social History Social History   Tobacco Use  . Smoking status: Former Smoker    Types: Cigarettes  . Smokeless tobacco: Never Used  Substance Use Topics  . Alcohol use: Yes    Alcohol/week: 4.2 oz    Types: 7 Standard drinks or equivalent per week    Comment: glass or beer at dinner  . Drug use: No    Review of Systems  Constitutional: No fever/chills Eyes: No visual changes. ENT: No sore throat. Cardiovascular: Denies chest pain. Respiratory: Denies shortness of breath. Gastrointestinal: See HPI Genitourinary: Negative for dysuria. Musculoskeletal: Negative for back pain. Skin: Negative for rash. Neurological: Negative for headaches, focal weakness   ____________________________________________   PHYSICAL EXAM:  VITAL SIGNS: ED Triage Vitals  Enc Vitals Group     BP 11/13/17 2321 (!) 167/113     Pulse Rate 11/13/17 2321 93     Resp 11/13/17 2321 20     Temp 11/13/17 2321 (!) 96.9 F (36.1 C)     Temp Source 11/13/17 2321 Oral     SpO2 11/13/17 2321 98 %     Weight 11/13/17 2322 113 lb (51.3 kg)     Height 11/13/17 2322 5\' 2"  (1.575 m)     Head Circumference --      Peak Flow --      Pain Score 11/13/17 2321 10     Pain Loc --      Pain Edu? --      Excl. in [place]? --     Constitutional: Alert and oriented. Well appearing and in no acute distress! Eyes: Conjunctivae are normal.  Head: Atraumatic. Nose: No congestion/rhinnorhea. Mouth/Throat: Mucous membranes are moist.  Oropharynx non-erythematous. Neck: No stridor Cardiovascular: Normal rate, regular rhythm. Grossly normal heart sounds.  Good peripheral circulation. Respiratory: Normal respiratory effort.  No retractions. Lungs CTAB. Gastrointestinal:  Soft tender to palpation in the left lower quadrant there are some firm areas under the skin palpated there.  No distention. No abdominal bruits. No CVA tenderness. Musculoskeletal: No lower extremity tenderness nor edema.  No joint effusions. Neurologic:  Normal speech and language. No gross focal neurologic deficits are appreciated.  Skin:  Skin is warm, dry and intact. No rash noted. Psychiatric: Mood and affect are normal. Speech and behavior are normal.  ____________________________________________   LABS (all labs ordered are listed, but only abnormal results are displayed)  Labs Reviewed  COMPREHENSIVE METABOLIC PANEL - Abnormal; Notable for the following components:      Result Value   Sodium 132 (*)    Chloride 98 (*)    Glucose, Bld 175 (*)    BUN 22 (*)  Creatinine, Ser 1.18 (*)    ALT 13 (*)    GFR calc non Af Amer 41 (*)    GFR calc Af Amer 47 (*)    All other components within normal limits  URINALYSIS, COMPLETE (UACMP) WITH MICROSCOPIC - Abnormal; Notable for the following components:   Color, Urine YELLOW (*)    APPearance CLEAR (*)    Glucose, UA 150 (*)    Ketones, ur 20 (*)    Leukocytes, UA TRACE (*)    All other components within normal limits  CBC   ____________________________________________  EKG   ____________________________________________  RADIOLOGY  __Radiology reports to me over the phone that the CT shows a mass sitting on the left ureter causing hydronephrosis which is new since prior study also there is a lot of stool and of course and metastasis.  __________________________________________   PROCEDURES  Procedure(s) performed:   Procedures  Critical Care performed:   ____________________________________________   INITIAL IMPRESSION / ASSESSMENT AND PLAN / ED COURSE Patient has had morphine and a dose of Dilaudid and still in a lot of pain 8 out of 10 now down from 10 out of 10 discussed with Dr. Rogue Bussing on-call for  oncology we will admit her for pain control and have urology stent or in the morning       ____________________________________________   FINAL CLINICAL IMPRESSION(S) / ED DIAGNOSES  Final diagnoses:  Left lower quadrant pain  Hydronephrosis, unspecified hydronephrosis type  Constipation, unspecified constipation type     ED Discharge Orders    None       Note:  This document was prepared using Dragon voice recognition software and may include unintentional dictation errors.    Nena Polio, MD 11/14/17 0200

## 2017-11-14 NOTE — Anesthesia Post-op Follow-up Note (Signed)
Anesthesia QCDR form completed.        

## 2017-11-14 NOTE — Transfer of Care (Signed)
Immediate Anesthesia Transfer of Care Note  Patient: Lisa Hayden  Procedure(s) Performed: CYSTOSCOPY WITH STENT PLACEMENT (Left )  Patient Location: PACU  Anesthesia Type:General  Level of Consciousness: drowsy and patient cooperative  Airway & Oxygen Therapy: Patient Spontanous Breathing and Patient connected to face mask oxygen  Post-op Assessment: Report given to RN and Post -op Vital signs reviewed and stable  Post vital signs: Reviewed and stable  Last Vitals:  Vitals:   11/14/17 1137 11/14/17 1256  BP: (!) 147/85 (!) 147/78  Pulse: 82 87  Resp: 15 (!) 8  Temp: (!) 36.2 C 37.3 C  SpO2: 95% 99%    Last Pain:  Vitals:   11/14/17 1256  TempSrc:   PainSc: Asleep      Patients Stated Pain Goal: 2 (48/54/62 7035)  Complications: No apparent anesthesia complications

## 2017-11-15 ENCOUNTER — Encounter: Payer: Self-pay | Admitting: Urology

## 2017-11-15 DIAGNOSIS — Z7189 Other specified counseling: Secondary | ICD-10-CM

## 2017-11-15 DIAGNOSIS — Z515 Encounter for palliative care: Secondary | ICD-10-CM

## 2017-11-15 DIAGNOSIS — N1330 Unspecified hydronephrosis: Secondary | ICD-10-CM

## 2017-11-15 DIAGNOSIS — C569 Malignant neoplasm of unspecified ovary: Secondary | ICD-10-CM

## 2017-11-15 DIAGNOSIS — C800 Disseminated malignant neoplasm, unspecified: Secondary | ICD-10-CM

## 2017-11-15 LAB — CA 125: CANCER ANTIGEN (CA) 125: 129 U/mL — AB (ref 0.0–38.1)

## 2017-11-15 MED ORDER — ENSURE ENLIVE PO LIQD: 237.0000 mL | Freq: Three times a day (TID) | ORAL | 0 refills | Status: DC

## 2017-11-15 NOTE — Evaluation (Signed)
Physical Therapy Evaluation Patient Details Name: Lisa Hayden MRN: [id number] DOB: 1929-12-06 Today's Date: 11/15/2017   History of Present Illness  Pt is a 83 y/o F who presented with abdominal pain with new L hydronephrosis and hydroureter.  Pt is a now s/p cystoscopy with stent placement (left) on 12/27.  Pt with metastatic ovarian cancer.  Pt's PMH includes osteoporosis, CKD.     Clinical Impression  Pt admitted with above diagnosis. Pt presents at mod I level of mobility.  She ambulates with steady gait except mild instability with horizontal head turns. 5xSTS: 10.59 sec which is WNL for the pt's age.  The pt lives in a handicap accessible ILF.  Encouraged the pt to ambulate with nursing staff at least 3x/day and to continue with exercise routine at d/c with exercise physiologist as she was doing PTA. No skilled PT needs identified. PT will sign off.      Follow Up Recommendations No PT follow up    Equipment Recommendations  None recommended by PT    Recommendations for Other Services       Precautions / Restrictions Precautions Precautions: None Restrictions Weight Bearing Restrictions: No      Mobility  Bed Mobility               General bed mobility comments: Pt sitting EOB at start and end of session  Transfers Overall transfer level: Independent Equipment used: None             General transfer comment: No physical assist or cues needed.  Pt performs independently without signs of instability.   Ambulation/Gait Ambulation/Gait assistance: Independent Ambulation Distance (Feet): 400 Feet Assistive device: None Gait Pattern/deviations: WFL(Within Functional Limits) Gait velocity: WNL Gait velocity interpretation: at or above normal speed for age/gender General Gait Details: Pt steady ambulating on firm surface without perturbation.  Mild unsteadiness with horizontal head turns but no LOB.  No signs of fatigue while ambulating.   Stairs             Wheelchair Mobility    Modified Rankin (Stroke Patients Only)       Balance Overall balance assessment: Modified Independent                               Standardized Balance Assessment Standardized Balance Assessment : Dynamic Gait Index   Dynamic Gait Index Level Surface: Normal Change in Gait Speed: Normal Gait with Horizontal Head Turns: Mild Impairment Gait with Vertical Head Turns: Normal Gait and Pivot Turn: Normal Step Over Obstacle: Normal Step Around Obstacles: Normal       Pertinent Vitals/Pain Pain Assessment: No/denies pain    Home Living Family/patient expects to be discharged to:: Private residence Living Arrangements: Spouse/significant other Available Help at Discharge: Family Type of Home: Independent living facility Home Access: Level entry     Home Layout: One level Home Equipment: Grab bars - tub/shower;Shower seat      Prior Function Level of Independence: Independent         Comments: Pt independent with ADLs, IADLs, mobility.  Does not use AD for ambulation.      Hand Dominance        Extremity/Trunk Assessment   Upper Extremity Assessment Upper Extremity Assessment: (BUE strength grossly 4/5)    Lower Extremity Assessment Lower Extremity Assessment: (BLE strength grossly 4/5)       Communication   Communication: No difficulties  Cognition Arousal/Alertness: Awake/alert Behavior  During Therapy: WFL for tasks assessed/performed Overall Cognitive Status: Within Functional Limits for tasks assessed                                        General Comments General comments (skin integrity, edema, etc.): 5xSTS: 10.59 sec which is WNL for the pt's age.     Exercises Other Exercises Other Exercises: Encouraged the pt to ambulate with nursing staff at least 3x/day and to continue with exercise routine at d/c with exercise physiologist as she was doing PTA.    Assessment/Plan    PT  Assessment Patent does not need any further PT services  PT Problem List         PT Treatment Interventions      PT Goals (Current goals can be found in the Care Plan section)  Acute Rehab PT Goals Patient Stated Goal: to go home PT Goal Formulation: All assessment and education complete, DC therapy    Frequency     Barriers to discharge        Co-evaluation               AM-PAC PT "6 Clicks" Daily Activity  Outcome Measure Difficulty turning over in bed (including adjusting bedclothes, sheets and blankets)?: None Difficulty moving from lying on back to sitting on the side of the bed? : None Difficulty sitting down on and standing up from a chair with arms (e.g., wheelchair, bedside commode, etc,.)?: None Help needed moving to and from a bed to chair (including a wheelchair)?: None Help needed walking in hospital room?: None Help needed climbing 3-5 steps with a railing? : None 6 Click Score: 24    End of Session Equipment Utilized During Treatment: Gait belt Activity Tolerance: Patient tolerated treatment well Patient left: in bed;with call bell/phone within reach;Other (comment)(pt sitting EOB) Nurse Communication: Mobility status PT Visit Diagnosis: Unsteadiness on feet (R26.81)    Time: 7672-0947 PT Time Calculation (min) (ACUTE ONLY): 16 min   Charges:   PT Evaluation $PT Eval Low Complexity: 1 Low     PT G Codes:   PT G-Codes **NOT FOR INPATIENT CLASS** Functional Assessment Tool Used: AM-PAC 6 Clicks Basic Mobility Functional Limitation: Mobility: Walking and moving around Mobility: Walking and Moving Around Current Status (S9628): 0 percent impaired, limited or restricted Mobility: Walking and Moving Around Goal Status (Z6629): 0 percent impaired, limited or restricted Mobility: Walking and Moving Around Discharge Status (U7654): 0 percent impaired, limited or restricted    Collie Siad PT, DPT 11/15/2017, 9:29 AM

## 2017-11-15 NOTE — Consult Note (Signed)
Consultation Note Date: 11/15/2017   Patient Name: Lisa Hayden  DOB: 1930-04-18  MRN: [id number]  Age / Sex: 83 y.o., female  PCP: Birdie Sons, MD Referring Physician: Loletha Grayer, MD  Reason for Consultation: Establishing goals of care  HPI/Patient Profile: 83 y.o. female  with past medical history of metastatic ovarian cancer and osteoporosis who was admitted on 11/13/2017 with ureteral obstruction secondary to tumor.  The obstruction caused severe pain and hydronephrosis.  Urology saw the patient and placed a ureteral stent.  Patient's symptoms are much improved.  Clinical Assessment and Goals of Care:  I have reviewed medical records including EPIC notes, labs and imaging, received report from the attending physician, assessed the patient and then met at the bedside along with her husband and close friend/neighbot to discuss diagnosis prognosis, GOC, EOL wishes, disposition and options.  I introduced Palliative Medicine as specialized medical care for people living with serious illness. It focuses on providing relief from the symptoms and stress of a serious illness. The goal is to improve quality of life for both the patient and the family.  Lisa Hayden is a very active vivacious 83 yo who enjoys ti-chi and weight lifting.  As far as functional and nutritional status, she is 100% independent with ADLs and when she is feeling well she eats well.  We discussed her current illness and what it means in the larger context of her on-going co-morbidities.  Natural disease trajectory and expectations at EOL were discussed.  Lisa Hayden asked me specific questions regarding her cancer - "What stage is it?"  "Is it curable?"  I answered those questions and we reviewed her CT scan images together.   She asked if she would continue to have complications - she has already had a bowel obstruction and now a  ureteral obstruction.  I explained that - yes it would be a matter of time before another complication would arise.  I attempted to elicit values and goals of care important to the patient.  Lisa Hayden enjoys being active and engaging with other people.  She DOES NOT want to be a burden to her husband. She does not want to go thru further severe pain as she has had with her two obstructions.   She clearly states that she does not want further chemo infusions as they have not stopped her cancer so far.  The difference between aggressive medical intervention and comfort care was considered in light of the patient's goals of care.   We discussed taking advantage of the hospice benefit provided by medicare.  At this point Lisa Hayden does not need physical assistance but it is appropriate to have hospice engaged for pain and symptom control in this patient with wide spread metastatic cancer.  Advanced directives, concepts specific to code status,and rehospitalization were considered and discussed.  Lisa Hayden wants to be a DNR.  Her husband is very hesitant.  We discussed DNR as a protective measure.  I presented them with a completed DNR form and  explained to the couple that it is important for her husband to understand and respect Lisa Hayden wishes.  However, I will not officially change her code status in Epic today as they are still making a final decision.  Hospice and Palliative Care services outpatient were explained and offered.  The family would like to accept Hospice services in their home.  [name] seeks guidance in how to support and care for his wife.  Lisa Hayden is keenly interested in being as pain free as possible and not being a burden to her 85 yo husband.  Questions and concerns were addressed.  Hard Choices booklet left for Lisa Hayden to review. The family was encouraged to call with questions or concerns.    Primary Decision Maker:  PATIENT    SUMMARY OF RECOMMENDATIONS      Recommending DNR (patient wants DNR - husband is processing it)  Patient desires no further chemo or aggressive treatment for her cancer.  Home with hospice services - initially just for support, guidance and pain control.  She does not need help with ADLs at this point.   They do not need Chaplain services.  Recommend senna BID as patient is severely constipated on imaging.  Code Status/Advance Care Planning:  Full code - contemplating DNR.   Symptom Management:   Senna tablet BID plus current miralax.  Psycho-social/Spiritual:  Desire for further Chaplaincy support: no thank you  Prognosis:  I anticipate rapid decline in this patient with widely metastatic ovarian cancer (lungs, liver, peritoneum, colon) who wants no further aggressive treatment (chemo/radiation/surgery).  Prognosis is likely less than 6 months.  Discharge Planning: Home with Hospice      Primary Diagnoses: Present on Admission: . Intractable abdominal pain   I have reviewed the medical record, interviewed the patient and family, and examined the patient. The following aspects are pertinent.  Past Medical History:  Diagnosis Date  . Cancer [facility])    peritoneal carcinoma, breast cancer, liver mets, colon, ovaries  . Hypertension   . Osteoporosis    Social History   Socioeconomic History  . Marital status: Married    Spouse name: Lisa Hayden  . Number of children: 1  . Years of education: College  . Highest education level: None  Social Needs  . Financial resource strain: Not hard at all  . Food insecurity - worry: Never true  . Food insecurity - inability: Never true  . Transportation needs - medical: No  . Transportation needs - non-medical: No  Occupational History  . Occupation: Retired  Tobacco Use  . Smoking status: Former Smoker    Types: Cigarettes  . Smokeless tobacco: Never Used  Substance and Sexual Activity  . Alcohol use: Yes    Alcohol/week: 4.2 oz    Types: 7 Standard  drinks or equivalent per week    Comment: glass or beer at dinner  . Drug use: No  . Sexual activity: None  Other Topics Concern  . None  Social History Narrative  . None   Family History  Problem Relation Age of Onset  . CVA Mother   . Congestive Heart Failure Mother   . Diabetes Sister   . Asthma Father    Scheduled Meds: . cholecalciferol  2,000 Units Oral QPM  . docusate sodium  100 mg Oral BID  . feeding supplement (ENSURE ENLIVE)  237 mL Oral TID BM  . polyethylene glycol  17 g Oral Daily   Continuous Infusions: PRN Meds:.acetaminophen **OR** acetaminophen, bisacodyl, HYDROmorphone (  DILAUDID) injection, ondansetron **OR** ondansetron (ZOFRAN) IV, oxyCODONE, prochlorperazine Allergies  Allergen Reactions  . Actonel [Risedronate Sodium] Other (See Comments)    headache  . Cortisone     very alert and did not sleep for 24 hrs  . Daypro  [Oxaprozin]   . Meloxicam   . Premarin [Conjugated Estrogens]     bleeding  . Prempro [Conj Estrog-Medroxyprogest Ace]    Review of Systems no complaints.  Physical Exam  Pleasant, female, A&O, conversant, coherent CV rrr Resp no distress Patient is mobile easily up and walking around.  Vital Signs: BP 129/61 (BP Location: Right Arm)   Pulse 86   Temp 98.9 F (37.2 C) (Oral)   Resp 20   Ht _0  (1.575 m)   Wt 60.8 kg (134 lb)   SpO2 97%   BMI 24.51 kg/m  Pain Assessment: 0-10   Pain Score: 0-No pain   SpO2: SpO2: 97 % O2 Device:SpO2: 97 % O2 Flow Rate: .O2 Flow Rate (L/min): 6 L/min  IO: Intake/output summary:   Intake/Output Summary (Last 24 hours) at 11/15/2017 1204 Last data filed at 11/15/2017 0900 Gross per 24 hour  Intake 1270 ml  Output 3 ml  Net 1267 ml    LBM:   Baseline Weight: Weight: 51.3 kg (113 lb) Most recent weight: Weight: 60.8 kg (134 lb)     Palliative Assessment/Data: 70%     Time In: 11:00 Time Out: 12:10 Time Total: 70 min. Greater than 50%  of this time was spent counseling  and coordinating care related to the above assessment and plan.  Signed by: Florentina Jenny, PA-C Palliative Medicine Pager: [phone]  Please contact Palliative Medicine Team phone at [phone] for questions and concerns.  For individual provider: See Shea Evans

## 2017-11-15 NOTE — Anesthesia Postprocedure Evaluation (Signed)
Anesthesia Post Note  Patient: DENISA ENTERLINE  Procedure(s) Performed: CYSTOSCOPY WITH STENT PLACEMENT (Left )  Patient location during evaluation: PACU Anesthesia Type: General Level of consciousness: awake and alert Pain management: pain level controlled Vital Signs Assessment: post-procedure vital signs reviewed and stable Respiratory status: spontaneous breathing, nonlabored ventilation, respiratory function stable and patient connected to nasal cannula oxygen Cardiovascular status: blood pressure returned to baseline and stable Postop Assessment: no apparent nausea or vomiting Anesthetic complications: no     Last Vitals:  Vitals:   11/14/17 1948 11/15/17 0412  BP: (!) 122/56 129/61  Pulse: 87 86  Resp: 18 20  Temp: 36.4 C 37.2 C  SpO2: 99% 97%    Last Pain:  Vitals:   11/15/17 0733  TempSrc:   PainSc: 0-No pain                 Martha Clan

## 2017-11-15 NOTE — Consult Note (Signed)
Urology follow-up consult  Noted prompt resolution of her flank pain after stent placement.  She has no complaints today and is being discharged.   Plan: Will have our office contact for follow-up in 6 weeks.

## 2017-11-15 NOTE — Discharge Summary (Signed)
[place] [facility] [place] NAME: Lisa Hayden    MR#:  [id number]  DATE OF BIRTH:  December 08, 1929  DATE OF ADMISSION:  11/13/2017 ADMITTING PHYSICIAN: Harrie Foreman, MD  DATE OF DISCHARGE: 11/15/2017 12:00 PM  PRIMARY CARE PHYSICIAN: Birdie Sons, MD    ADMISSION DIAGNOSIS:  Left lower quadrant pain [R10.32] Hydronephrosis, unspecified hydronephrosis type [N13.30] Constipation, unspecified constipation type [K59.00]  DISCHARGE DIAGNOSIS:  Active Problems:   Intractable abdominal pain   Hydronephrosis   Primary malignant neoplasm of ovary with widespread metastatic disease [facility])   Palliative care encounter   Encounter for hospice care discussion   Goals of care, counseling/discussion   SECONDARY DIAGNOSIS:   Past Medical History:  Diagnosis Date  . Cancer [facility])    peritoneal carcinoma, breast cancer, liver mets, colon, ovaries  . Hypertension   . Osteoporosis     HOSPITAL COURSE:   1.  Abdominal pain with new left hydronephrosis and hydroureter.  The patient had resolution of her pain after cystoscopy and left ureteral stent placement by Dr. Bernardo Heater.  The patient did have a little hematuria.  Stable for disposition home at this point.  Follow-up with Dr. Bernardo Heater urology as outpatient. 2.  Metastatic ovarian cancer.  Follow-up with Dr. Grayland Ormond as outpatient.  Overall prognosis is poor.  I was going around and around in circles with them about DNR status.  Palliative care did see the patient after me and stated the patient does wish to be a DNR but the husband was hesitant on this.  The patient stated that she may not want further chemotherapy.  This can be discussed further with Dr. Grayland Ormond.  Patient would be a candidate for hospice services at home. 3.  Chronic kidney disease stage III 4.  History of osteoporosis 5.  Severe malnutrition in the context of chronic illness as per dietary.  Ensure prescribed follow-up with Dr.  Grayland Ormond 6.  Patient did well with physical therapy and had no needs at this point.  DISCHARGE CONDITIONS:   Guarded  CONSULTS OBTAINED:  Treatment Team:  Festus Aloe, MD Lloyd Huger, MD  DRUG ALLERGIES:   Allergies  Allergen Reactions  . Actonel [Risedronate Sodium] Other (See Comments)    headache  . Cortisone     very alert and did not sleep for 24 hrs  . Daypro  [Oxaprozin]   . Meloxicam   . Premarin [Conjugated Estrogens]     bleeding  . Prempro [Conj Estrog-Medroxyprogest Ace]     DISCHARGE MEDICATIONS:   Allergies as of 11/15/2017      Reactions   Actonel [risedronate Sodium] Other (See Comments)   headache   Cortisone    very alert and did not sleep for 24 hrs   Daypro  [oxaprozin]    Meloxicam    Premarin [conjugated Estrogens]    bleeding   Prempro [conj Estrog-medroxyprogest Ace]       Medication List    STOP taking these medications   prochlorperazine 10 MG tablet Commonly known as:  COMPAZINE     TAKE these medications   alendronate 70 MG tablet Commonly known as:  FOSAMAX Take 1 tablet (70 mg total) by mouth once a week. On Sunday   cholecalciferol 1000 units tablet Commonly known as:  VITAMIN D Take 2,000 Units by mouth every evening. With dinner   feeding supplement (ENSURE ENLIVE) Liqd Take 237 mLs by mouth 3 (three) times daily between meals.   ondansetron 8  MG tablet Commonly known as:  ZOFRAN Take 1 tablet (8 mg total) by mouth every 8 (eight) hours as needed for nausea or vomiting.   oxyCODONE 5 MG immediate release tablet Commonly known as:  Oxy IR/ROXICODONE Take 1 tablet (5 mg total) by mouth every 6 (six) hours as needed for severe pain.   polyethylene glycol packet Commonly known as:  MIRALAX / GLYCOLAX Take 17 g by mouth as needed.        DISCHARGE INSTRUCTIONS:    Follow-up with Dr. Grayland Ormond oncology with first appointment available Follow-up with Dr. Bernardo Heater as outpatient  If you experience  worsening of your admission symptoms, develop shortness of breath, life threatening emergency, suicidal or homicidal thoughts you must seek medical attention immediately by calling 911 or calling your MD immediately  if symptoms less severe.  You Must read complete instructions/literature along with all the possible adverse reactions/side effects for all the Medicines you take and that have been prescribed to you. Take any new Medicines after you have completely understood and accept all the possible adverse reactions/side effects.   Please note  You were cared for by a hospitalist during your hospital stay. If you have any questions about your discharge medications or the care you received while you were in the hospital after you are discharged, you can call the unit and asked to speak with the hospitalist on call if the hospitalist that took care of you is not available. Once you are discharged, your primary care physician will handle any further medical issues. Please note that NO REFILLS for any discharge medications will be authorized once you are discharged, as it is imperative that you return to your primary care physician (or establish a relationship with a primary care physician if you do not have one) for your aftercare needs so that they can reassess your need for medications and monitor your lab values.    Today   CHIEF COMPLAINT:   Chief Complaint  Patient presents with  . Emesis    HISTORY OF PRESENT ILLNESS:  Lisa Hayden  is a 83 y.o. female presented with abdominal pain and vomiting   VITAL SIGNS:  Blood pressure 129/61, pulse 86, temperature 98.9 F (37.2 C), temperature source Oral, resp. rate 20, height 5\' 2"  (1.575 m), weight 60.8 kg (134 lb), SpO2 97 %.    PHYSICAL EXAMINATION:  GENERAL:  83 y.o.-year-old patient lying in the bed with no acute distress.  EYES: Pupils equal, round, reactive to light and accommodation. No scleral icterus. Extraocular muscles  intact.  HEENT: Head atraumatic, normocephalic. Oropharynx and nasopharynx clear.  NECK:  Supple, no jugular venous distention. No thyroid enlargement, no tenderness.  LUNGS: Normal breath sounds bilaterally, no wheezing, rales,rhonchi or crepitation. No use of accessory muscles of respiration.  CARDIOVASCULAR: S1, S2 normal. No murmurs, rubs, or gallops.  ABDOMEN: Soft, non-tender, non-distended. Bowel sounds present. No organomegaly or mass.  EXTREMITIES: No pedal edema, cyanosis, or clubbing.  NEUROLOGIC: Cranial nerves II through XII are intact. Muscle strength 5/5 in all extremities. Sensation intact. Gait not checked.  PSYCHIATRIC: The patient is alert and oriented x 3.  SKIN: No obvious rash, lesion, or ulcer.   DATA REVIEW:   CBC Recent Labs  Lab 11/14/17 0006  WBC 7.2  HGB 12.2  HCT 36.5  PLT 237    Chemistries  Recent Labs  Lab 11/14/17 0006  NA 132*  K 3.9  CL 98*  CO2 24  GLUCOSE 175*  BUN 22*  CREATININE 1.18*  CALCIUM 9.1  AST 23  ALT 13*  ALKPHOS 61  BILITOT 1.0   Management plans discussed with the patient, family and they are in agreement.  CODE STATUS:  Code Status History    Date Active Date Inactive Code Status Order ID Comments User Context   11/14/2017 18:13 11/15/2017 15:49 Full Code [id number]  Ammie Dalton, RN Inpatient   11/14/2017 14:54 11/14/2017 18:13 DNR [id number]  Loletha Grayer, MD Inpatient   11/14/2017 04:59 11/14/2017 14:54 Full Code [id number]  Harrie Foreman, MD Inpatient   01/16/2017 01:54 01/22/2017 03:20 Full Code [id number]  Lance Coon, MD Inpatient    Advance Directive Documentation     Most Recent Value  Type of Advance Directive  Healthcare Power of [place], Living will  Pre-existing out of facility DNR order (yellow form or pink MOST form)  No data  "MOST" Form in Place?  No data      TOTAL TIME TAKING CARE OF THIS PATIENT: 40 minutes.    Loletha Grayer M.D on 11/15/2017 at 4:01 PM  Between 7am to  6pm - Pager - [phone]  After 6pm go to [email or web address] - password [email or web address]  [facility] Office  [phone]  CC: Primary care physician; Birdie Sons, MD

## 2017-11-20 ENCOUNTER — Telehealth: Payer: Self-pay | Admitting: Urology

## 2017-11-20 NOTE — Telephone Encounter (Signed)
-----   Message from Abbie Sons, MD sent at 11/15/2017 11:52 AM EST ----- Please schedule follow-up appointment with KUB with me approximately 6 weeks.  Being discharged from hospital today.

## 2017-11-20 NOTE — Telephone Encounter (Signed)
App made sent message for [facility] to put order in for KUB  Patient is aware  Lisa Hayden

## 2017-11-20 NOTE — Progress Notes (Signed)
Lisa Hayden  [phone] [phone]  ID: Lisa Hayden OB: 05/18/1930  MR#: [id number]  HER#:[id number]  Patient Care Team: Birdie Sons, MD as PCP - General (Family Medicine) Clent Jacks, RN as Registered Nurse Birder Robson, MD as Referring Physician (Ophthalmology) Lloyd Huger, MD as Consulting Physician (Oncology) Benjaman Kindler, MD as Consulting Physician (Obstetrics and Gynecology) Gillis Ends, MD as Referring Physician (Obstetrics and Gynecology) Oneta Rack, MD as Consulting Physician (Dermatology)  CHIEF COMPLAINT: Recurrent stage IV peritoneal carcinomatosis.  INTERVAL HISTORY: Patient returns to clinic today for further evaluation and discussion of her imaging results.  Her abdominal pain has now resolved and she feels nearly back to her baseline.  She does not complain of weakness or fatigue today. She has no neurologic complaints. She denies any recent fevers. She has a good appetite and has maintained her weight. She has no chest pain or shortness of breath. She denies any nausea, vomiting, constipation, or diarrhea. She has no abdominal pain. She has no urinary complaints. Patient offers no specific complaints today.  REVIEW OF SYSTEMS:   Review of Systems  Constitutional: Negative for fever, malaise/fatigue and weight loss.  Respiratory: Negative.  Negative for cough and shortness of breath.   Cardiovascular: Negative.  Negative for chest pain and leg swelling.  Gastrointestinal: Negative.  Negative for abdominal pain.  Genitourinary: Negative.   Musculoskeletal: Negative.   Skin: Negative.  Negative for rash.  Neurological: Negative for sensory change and weakness.  Psychiatric/Behavioral: Negative.  The patient is not nervous/anxious.    As per HPI. Otherwise, a complete review of systems is negative.  PAST MEDICAL HISTORY: Past Medical History:  Diagnosis Date  . Cancer [facility])    peritoneal carcinoma, breast cancer, liver mets, colon, ovaries  . Hypertension   . Osteoporosis     PAST SURGICAL HISTORY: Past Surgical History:  Procedure Laterality Date  . 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 APPENDECTOMY  1953  . BREAST LUMPECTOMY Left 10/29/94   negative nodes. Treated with radiation only  . BREAST SURGERY    . BUNIONECTOMY  1979  . CATARACT EXTRACTION, BILATERAL     left 07/29/2012; right 06/24/2012  . COLON SURGERY  05/1991   secondary to cancer   . CYSTOSCOPY WITH STENT PLACEMENT Left 11/14/2017   Procedure: CYSTOSCOPY WITH STENT PLACEMENT;  Surgeon: Abbie Sons, MD;  Location: [facility] ORS;  Service: Urology;  Laterality: Left;  . ROTATOR CUFF REPAIR Right 01/25/10  . TONSILLECTOMY  1938   and adenoidectomy    FAMILY HISTORY: Family History  Problem Relation Age of Onset  . CVA Mother   . Congestive Heart Failure Mother   . Diabetes Sister   . Asthma Father     ADVANCED DIRECTIVES (Y/N):  N  HEALTH MAINTENANCE: Social History   Tobacco Use  . Smoking status: Former Smoker    Types: Cigarettes  . Smokeless tobacco: Never Used  Substance Use Topics  . Alcohol use: Yes    Alcohol/week: 4.2 oz    Types: 7 Standard drinks or equivalent per week    Comment: glass or beer at dinner  . Drug use: No     Colonoscopy:  PAP:  Bone density:  Lipid panel:  Allergies  Allergen Reactions  . Actonel [Risedronate Sodium] Other (See Comments)    headache  . Cortisone     very alert and did not sleep for 24  hrs  . Daypro  [Oxaprozin]   . Meloxicam   . Premarin [Conjugated Estrogens]     bleeding  . Prempro [Conj Estrog-Medroxyprogest Ace]     Current Outpatient Medications  Medication Sig Dispense Refill  . acetaminophen (TYLENOL) 500 MG tablet Take 500 mg by mouth every 6 (six) hours as needed.    . cholecalciferol (VITAMIN D) 1000 UNITS tablet Take 2,000 Units by mouth every evening.  With dinner     . feeding supplement, ENSURE ENLIVE, (ENSURE ENLIVE) LIQD Take 237 mLs by mouth 3 (three) times daily between meals. (Patient taking differently: Take 237 mLs by mouth 3 (three) times daily between meals. She is only drinking one every other day) 90 Bottle 0  . polyethylene glycol (MIRALAX / GLYCOLAX) packet Take 17 g by mouth as needed.    Marland Kitchen alendronate (FOSAMAX) 70 MG tablet Take 1 tablet (70 mg total) by mouth once a week. On Sunday (Patient not taking: Reported on 11/21/2017) 12 tablet 4  . ondansetron (ZOFRAN) 8 MG tablet Take 1 tablet (8 mg total) by mouth every 8 (eight) hours as needed for nausea or vomiting. (Patient not taking: Reported on 11/21/2017) 60 tablet 1  . Oxycodone HCl 10 MG TABS Take 1 tablet (10 mg total) by mouth every 6 (six) hours as needed (pain). 120 tablet 0   No current facility-administered medications for this visit.     OBJECTIVE: Vitals:   11/21/17 1520  BP: 128/83  Pulse: 89  Resp: 18  Temp: 97.7 F (36.5 C)     Body mass index is 21.78 kg/m.    ECOG FS:0 - Asymptomatic  General: Well-developed, well-nourished, no acute distress. Eyes: Pink conjunctiva, anicteric sclera. Lungs: Clear to auscultation bilaterally. Heart: Regular rate and rhythm. No rubs, murmurs, or gallops. Abdomen: Soft, nontender, nondistended. No organomegaly noted, normoactive bowel sounds. Musculoskeletal: No edema, cyanosis, or clubbing. Neuro: Alert, answering all questions appropriately. Cranial nerves grossly intact. Skin: No rashes or petechiae noted. Psych: Normal affect.   LAB RESULTS:  Lab Results  Component Value Date   NA 132 (L) 11/14/2017   K 3.9 11/14/2017   CL 98 (L) 11/14/2017   CO2 24 11/14/2017   GLUCOSE 175 (H) 11/14/2017   BUN 22 (H) 11/14/2017   CREATININE 1.18 (H) 11/14/2017   CALCIUM 9.1 11/14/2017   PROT 7.5 11/14/2017   ALBUMIN 4.1 11/14/2017   AST 23 11/14/2017   ALT 13 (L) 11/14/2017   ALKPHOS 61 11/14/2017   BILITOT 1.0  11/14/2017   GFRNONAA 41 (L) 11/14/2017   GFRAA 47 (L) 11/14/2017    Lab Results  Component Value Date   WBC 7.2 11/14/2017   NEUTROABS 1.6 09/30/2017   HGB 12.2 11/14/2017   HCT 36.5 11/14/2017   MCV 92.6 11/14/2017   PLT 237 11/14/2017   Lab Results  Component Value Date   CA125 22.3 05/08/2017     STUDIES: Ct Abdomen Pelvis W Contrast  Result Date: 11/14/2017 CLINICAL DATA:  84 year old female with complaint of right-sided flank pain radiating. Diagnosed with ovarian cancer in February 2018. EXAM: CT ABDOMEN AND PELVIS WITH CONTRAST TECHNIQUE: Multidetector CT imaging of the abdomen and pelvis was performed using the standard protocol following bolus administration of intravenous contrast. CONTRAST:  28mL ISOVUE-300 IOPAMIDOL (ISOVUE-300) INJECTION 61% COMPARISON:  CT the abdomen and pelvis 09/30/2017. FINDINGS: Lower chest: Several new areas of small pulmonary nodules scattered throughout both lung bases, measuring up to 6 mm on the left (axial image 12 of series  4), nonspecific, but suspicious for potential metastatic disease. Hepatobiliary: Interval increase in number and size of what are now innumerable ill-defined hypovascular liver lesions scattered throughout all aspects of the liver, with the largest of these currently measuring up to 2 cm in diameter between segments 4B and 5 (axial image 27 of series 2), previously only 11 mm on 09/30/2017, most compatible with progressive metastatic disease. No intra or extrahepatic biliary ductal dilatation. Numerous calcified gallstones lying dependently in the gallbladder. No findings to suggest an acute cholecystitis at this time. Pancreas: No pancreatic mass. No pancreatic ductal dilatation. No pancreatic or peripancreatic fluid or inflammatory changes. Spleen: Unremarkable. Adrenals/Urinary Tract: Mild thickening of the adrenal glands bilaterally without definite dominant nodule. Right kidney is normal in appearance. New moderate  left-sided hydroureteronephrosis which appears related to obstruction of the middle third of the left ureter by what appears to be a retroperitoneal nodal mass (see discussion below). Urinary bladder is normal in appearance. Stomach/Bowel: Normal appearance of the stomach. No pathologic dilatation of small bowel or colon. The appendix is not confidently identified and may be surgically absent. Regardless, there are no inflammatory changes noted adjacent to the cecum to suggest the presence of an acute appendicitis at this time. Vascular/Lymphatic: Aortic atherosclerosis, without evidence of aneurysm or dissection in the abdominal or pelvic vasculature. Malignant appearing soft tissue along the left pelvic side wall causing obstruction of the middle third of the left ureter (axial image 48 of series 2) measuring 2.2 x 1.5 cm, favored to represent a metastatic lymph node. Additional enhancing soft tissue in the upper left retroperitoneum adjacent to the left renal hilum measuring 1.3 cm in short axis (previous only only 1 cm) on axial image 24 of series 2, also suspicious for metastatic lymphadenopathy. Reproductive: Status post total abdominal hysterectomy and bilateral salpingo-oophorectomy. Other: Status post omentectomy. No significant volume of ascites. No pneumoperitoneum. Musculoskeletal: There are no aggressive appearing lytic or blastic lesions noted in the visualized portions of the skeleton. IMPRESSION: 1. Today's study demonstrates progressive metastatic disease, with increased number and size of innumerable hepatic metastases, interval development of small pulmonary nodules throughout the lung bases which are also suspicious for metastatic lesions, and worsening left-sided pelvic and retroperitoneal lymphadenopathy. 2. New moderate left-sided hydroureteronephrosis which appears related to obstruction of the middle third of the left ureter related to the left pelvic lymphadenopathy. 3. Aortic  atherosclerosis. Aortic Atherosclerosis (ICD10-I70.0). Electronically Signed   By: Vinnie Langton M.D.   On: 11/14/2017 08:26    ASSESSMENT: Recurrent stage IV peritoneal carcinomatosis with metastatic lesions in liver and lung. PLAN:    1.  Recurrent stage IV peritoneal carcinomatosis: Patient completed 4 cycles of carboplatinum and Taxol on May 08, 2017. Restaging CT scan on June 13, 2017 reviewed independently with significant improvement of patient's disease burden, although she had a left inguinal lymph node that revealed persistent disease.  She then received 3 additional cycles of carboplatinum and Taxol completing cycle 7 on August 28, 2017.  Patient's most recent CT scan on November 13, 2017 revealed widespread metastatic disease with lesions in her lungs and liver.  Her CA-125 is also trending up.  Patient was offered both enrollment in a clinical trial as well as palliative chemotherapy using Doxil plus Avastin, but she has declined all treatment.  Patient is not ready to enroll in hospice at this time, but will call clinic once her performance status begins to decline.  She expressed understanding that she can change her mind at any  time if she wishes to pursue treatment.  No further follow-up to have been scheduled.   Approximately 30 minutes was spent in discussion, which greater than 50% was consultation.  Patient expressed understanding and was in agreement with this plan. She also understands that She can call clinic at any time with any questions, concerns, or complaints.   Cancer Staging Peritoneal carcinomatosis [facility]) Staging form: Soft Tissue Sarcoma of the Abdomen and Thoracic Visceral Organs, AJCC 8th Edition - Clinical stage from 02/13/2017: cT4c, cN1, cM0 - Signed by Lloyd Huger, MD on 02/13/2017   Lloyd Huger, MD   11/24/2017 9:25 AM

## 2017-11-21 ENCOUNTER — Other Ambulatory Visit: Payer: Self-pay | Admitting: Urology

## 2017-11-21 ENCOUNTER — Inpatient Hospital Stay: Payer: Medicare Other | Attending: Oncology | Admitting: Oncology

## 2017-11-21 ENCOUNTER — Inpatient Hospital Stay: Payer: Medicare Other

## 2017-11-21 VITALS — BP 128/83 | HR 89 | Temp 97.7°F | Resp 18 | Wt 119.1 lb

## 2017-11-21 DIAGNOSIS — N1330 Unspecified hydronephrosis: Secondary | ICD-10-CM

## 2017-11-21 DIAGNOSIS — C786 Secondary malignant neoplasm of retroperitoneum and peritoneum: Secondary | ICD-10-CM | POA: Diagnosis present

## 2017-11-21 DIAGNOSIS — C801 Malignant (primary) neoplasm, unspecified: Secondary | ICD-10-CM

## 2017-11-21 DIAGNOSIS — C569 Malignant neoplasm of unspecified ovary: Secondary | ICD-10-CM

## 2017-11-21 DIAGNOSIS — Z90722 Acquired absence of ovaries, bilateral: Secondary | ICD-10-CM | POA: Diagnosis not present

## 2017-11-21 DIAGNOSIS — I700 Atherosclerosis of aorta: Secondary | ICD-10-CM | POA: Diagnosis not present

## 2017-11-21 DIAGNOSIS — C7800 Secondary malignant neoplasm of unspecified lung: Secondary | ICD-10-CM | POA: Diagnosis not present

## 2017-11-21 DIAGNOSIS — C800 Disseminated malignant neoplasm, unspecified: Secondary | ICD-10-CM

## 2017-11-21 DIAGNOSIS — Z853 Personal history of malignant neoplasm of breast: Secondary | ICD-10-CM | POA: Diagnosis not present

## 2017-11-21 DIAGNOSIS — Z9221 Personal history of antineoplastic chemotherapy: Secondary | ICD-10-CM

## 2017-11-21 DIAGNOSIS — M810 Age-related osteoporosis without current pathological fracture: Secondary | ICD-10-CM | POA: Diagnosis not present

## 2017-11-21 DIAGNOSIS — I10 Essential (primary) hypertension: Secondary | ICD-10-CM | POA: Diagnosis not present

## 2017-11-21 DIAGNOSIS — C787 Secondary malignant neoplasm of liver and intrahepatic bile duct: Secondary | ICD-10-CM

## 2017-11-21 DIAGNOSIS — Z79899 Other long term (current) drug therapy: Secondary | ICD-10-CM | POA: Diagnosis not present

## 2017-11-21 DIAGNOSIS — Z87891 Personal history of nicotine dependence: Secondary | ICD-10-CM

## 2017-11-21 DIAGNOSIS — Z90710 Acquired absence of both cervix and uterus: Secondary | ICD-10-CM

## 2017-11-21 MED ORDER — OXYCODONE HCL 10 MG PO TABS: 10.0000 mg | ORAL_TABLET | Freq: Four times a day (QID) | ORAL | 0 refills | Status: DC | PRN

## 2017-11-26 ENCOUNTER — Ambulatory Visit: Payer: Medicare Other | Admitting: Oncology

## 2017-11-28 ENCOUNTER — Telehealth: Payer: Self-pay | Admitting: Urology

## 2017-11-28 ENCOUNTER — Telehealth: Payer: Self-pay | Admitting: *Deleted

## 2017-11-28 NOTE — Telephone Encounter (Signed)
Patient called and wanted doctor to know that she went to walk in clinic and she has a UTI. She was put on Cipro for it

## 2017-11-28 NOTE — Telephone Encounter (Signed)
Patient wanted to let you know that she went to the walk in clinic and had a UTI they gave her 7 days of Cipro and wanted to know if she needed to be seen here sooner than her follow up in March since she just had surgery and she has a stent?  Lisa Hayden

## 2017-11-29 NOTE — Telephone Encounter (Signed)
Can schedule follow-up appointment in 2-3 weeks

## 2017-11-29 NOTE — Telephone Encounter (Signed)
App made and patient notified ° °Michelle °

## 2017-12-11 ENCOUNTER — Ambulatory Visit: Payer: Medicare Other | Admitting: Urology

## 2017-12-13 ENCOUNTER — Encounter: Payer: Self-pay | Admitting: Urology

## 2017-12-13 ENCOUNTER — Ambulatory Visit (INDEPENDENT_AMBULATORY_CARE_PROVIDER_SITE_OTHER): Payer: Medicare Other | Admitting: Urology

## 2017-12-13 VITALS — BP 138/79 | HR 99 | Ht 62.0 in | Wt 113.2 lb

## 2017-12-13 DIAGNOSIS — R3129 Other microscopic hematuria: Secondary | ICD-10-CM

## 2017-12-13 LAB — URINALYSIS, COMPLETE
Bilirubin, UA: NEGATIVE
GLUCOSE, UA: NEGATIVE
Nitrite, UA: NEGATIVE
SPEC GRAV UA: 1.025 (ref 1.005–1.030)
UUROB: 0.2 mg/dL (ref 0.2–1.0)
pH, UA: 5.5 (ref 5.0–7.5)

## 2017-12-13 LAB — MICROSCOPIC EXAMINATION: EPITHELIAL CELLS (NON RENAL): NONE SEEN /HPF (ref 0–10)

## 2017-12-16 ENCOUNTER — Other Ambulatory Visit: Payer: Self-pay | Admitting: *Deleted

## 2017-12-16 ENCOUNTER — Telehealth: Payer: Self-pay | Admitting: *Deleted

## 2017-12-16 ENCOUNTER — Encounter: Payer: Self-pay | Admitting: Urology

## 2017-12-16 MED ORDER — TRAMADOL HCL 50 MG PO TABS: 50.0000 mg | ORAL_TABLET | Freq: Three times a day (TID) | ORAL | 0 refills | Status: DC | PRN

## 2017-12-16 NOTE — Telephone Encounter (Signed)
Patient reports she is having difficulty adjusting to Oxycodone 10 mg it makes her nauseated and vomits after first dose. She took half with a tylenol which worked, but she is concerned about taking so much tylenol. She tried another 10 mg dose this morning and so far she is very sleepy and had dry heaves. She is asking what else can be tried.  Please advise

## 2017-12-16 NOTE — Progress Notes (Signed)
12/13/2017 7:32 AM   Lisa Hayden 1930/10/04 [id number]  Referring provider: Birdie Sons, MD [address] [place], [place] [address]  Chief Complaint  Patient presents with  . Hematuria    HPI: Lisa Hayden is an 84 year old female seen on 11/14/2017 with acute left renal colic secondary to obstruction from metastatic ovarian cancer.  She underwent placement of a left ureteral stent with resolution of her pain.  She was recently seen in follow-up by Dr. Grayland Ormond and has elected no palliative chemotherapy.  She was treated for a UTI approximately 1-2 weeks after stent placement and is presently asymptomatic.   PMH: Past Medical History:  Diagnosis Date  . Cancer [facility])    peritoneal carcinoma, breast cancer, liver mets, colon, ovaries  . Hypertension   . Osteoporosis     Surgical History: Past Surgical History:  Procedure Laterality Date  . 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 APPENDECTOMY  1953  . BREAST LUMPECTOMY Left 10/29/94   negative nodes. Treated with radiation only  . BREAST SURGERY    . BUNIONECTOMY  1979  . CATARACT EXTRACTION, BILATERAL     left 07/29/2012; right 06/24/2012  . COLON SURGERY  05/1991   secondary to cancer   . CYSTOSCOPY WITH STENT PLACEMENT Left 11/14/2017   Procedure: CYSTOSCOPY WITH STENT PLACEMENT;  Surgeon: Abbie Sons, MD;  Location: [facility] ORS;  Service: Urology;  Laterality: Left;  . ROTATOR CUFF REPAIR Right 01/25/10  . TONSILLECTOMY  1938   and adenoidectomy    Home Medications:  Allergies as of 12/13/2017      Reactions   Actonel [risedronate Sodium] Other (See Comments)   headache   Cortisone    very alert and did not sleep for 24 hrs   Daypro  [oxaprozin]    Meloxicam    Premarin [conjugated Estrogens]    bleeding   Prempro [conj Estrog-medroxyprogest Ace]       Medication List        Accurate as of 12/13/17 11:59 PM. Always use your most  recent med list.          acetaminophen 500 MG tablet Commonly known as:  TYLENOL Take 500 mg by mouth every 6 (six) hours as needed.   cholecalciferol 1000 units tablet Commonly known as:  VITAMIN D Take 2,000 Units by mouth every evening. With dinner   Oxycodone HCl 10 MG Tabs Take 1 tablet (10 mg total) by mouth every 6 (six) hours as needed (pain).   polyethylene glycol packet Commonly known as:  MIRALAX / GLYCOLAX Take 17 g by mouth as needed.       Allergies:  Allergies  Allergen Reactions  . Actonel [Risedronate Sodium] Other (See Comments)    headache  . Cortisone     very alert and did not sleep for 24 hrs  . Daypro  [Oxaprozin]   . Meloxicam   . Premarin [Conjugated Estrogens]     bleeding  . Prempro [Conj Estrog-Medroxyprogest Ace]     Family History: Family History  Problem Relation Age of Onset  . CVA Mother   . Congestive Heart Failure Mother   . Diabetes Sister   . Asthma Father     Social History:  reports that she has quit smoking. Her smoking use included cigarettes. she has never used smokeless tobacco. She reports that she drinks about 4.2 oz of alcohol per week. She reports that she does not  use drugs.  ROS: UROLOGY Frequent Urination?: No Hard to postpone urination?: No Burning/pain with urination?: No Get up at night to urinate?: No Leakage of urine?: No Urine stream starts and stops?: No Trouble starting stream?: No Do you have to strain to urinate?: No Blood in urine?: No Urinary tract infection?: No Sexually transmitted disease?: No Injury to kidneys or bladder?: No Painful intercourse?: No Weak stream?: No Currently pregnant?: No Vaginal bleeding?: No Last menstrual period?: n  Gastrointestinal Nausea?: No Vomiting?: No Indigestion/heartburn?: No Diarrhea?: Yes Constipation?: Yes  Constitutional Fever: No Night sweats?: No Weight loss?: No Fatigue?: No  Skin Skin rash/lesions?: No Itching?:  No  Eyes Blurred vision?: No Double vision?: No  Ears/Nose/Throat Sore throat?: No Sinus problems?: No  Hematologic/Lymphatic Swollen glands?: No Easy bruising?: No  Cardiovascular Leg swelling?: No Chest pain?: No  Respiratory Cough?: No Shortness of breath?: No  Endocrine Excessive thirst?: No  Musculoskeletal Back pain?: Yes Joint pain?: No  Neurological Headaches?: No Dizziness?: No  Psychologic Depression?: No Anxiety?: No  Physical Exam: BP 138/79 (BP Location: Left Arm, Patient Position: Sitting, Cuff Size: Normal)   Pulse 99   Ht 5\' 2"  (1.575 m)   Wt 113 lb 3.2 oz (51.3 kg)   BMI 20.70 kg/m   Constitutional:  Alert and oriented, No acute distress. HEENT: [place] AT, moist mucus membranes.  Trachea midline, no masses. Cardiovascular: No clubbing, cyanosis, or edema. Respiratory: Normal respiratory effort, no increased work of breathing. GI: Abdomen is soft, nontender, nondistended, no abdominal masses GU: No CVA tenderness.  Skin: No rashes, bruises or suspicious lesions. Lymph: No cervical or inguinal adenopathy. Neurologic: Grossly intact, no focal deficits, moving all 4 extremities. Psychiatric: Normal mood and affect.  Laboratory Data: Lab Results  Component Value Date   WBC 7.2 11/14/2017   HGB 12.2 11/14/2017   HCT 36.5 11/14/2017   MCV 92.6 11/14/2017   PLT 237 11/14/2017    Lab Results  Component Value Date   CREATININE 1.18 (H) 11/14/2017    Lab Results  Component Value Date   HGBA1C 5.4 11/14/2017    Urinalysis Lab Results  Component Value Date   SPECGRAV 1.025 12/13/2017   PHUR 5.5 12/13/2017   COLORU Yellow 12/13/2017   APPEARANCEUR Clear 12/13/2017   LEUKOCYTESUR Trace (A) 12/13/2017   PROTEINUR 1+ (A) 12/13/2017   GLUCOSEU Negative 12/13/2017   KETONESU Trace (A) 12/13/2017   RBCU Trace (A) 12/13/2017   BILIRUBINUR Negative 12/13/2017   UUROB 0.2 12/13/2017   NITRITE Negative 12/13/2017    Lab Results   Component Value Date   LABMICR See below: 12/13/2017   WBCUA 0-5 12/13/2017   RBCUA 0-2 12/13/2017   LABEPIT None seen 12/13/2017   MUCUS Present (A) 12/13/2017   BACTERIA Moderate (A) 12/13/2017    Assessment & Plan:    84 year old female with left ureteral obstruction secondary to metastatic ovarian cancer.  Currently doing well with stent placement.  We will plan on stent exchange in April 2019.  She was instructed to call earlier for recurrent pain or UTI symptoms.    Abbie Sons, [place] [address], [place], [place] [address] [phone]

## 2017-12-16 NOTE — Telephone Encounter (Signed)
She can try tramadol 50mg  TID PRN.

## 2017-12-16 NOTE — Telephone Encounter (Signed)
Prescription faxed, patient informed of new prescription sent in

## 2017-12-23 ENCOUNTER — Ambulatory Visit: Payer: Medicare Other

## 2017-12-25 ENCOUNTER — Ambulatory Visit: Payer: Medicare Other | Admitting: Oncology

## 2017-12-25 ENCOUNTER — Other Ambulatory Visit: Payer: Medicare Other

## 2017-12-27 NOTE — Progress Notes (Deleted)
[place]  [phone] [phone]  ID: [name] OB: 21-Oct-1930  MR#: [id number]  JJK#:[id number]  Patient Care Team: Birdie Sons, MD as PCP - General (Family Medicine) Clent Jacks, RN as Registered Nurse Birder Robson, MD as Referring Physician (Ophthalmology) Lloyd Huger, MD as Consulting Physician (Oncology) Benjaman Kindler, MD as Consulting Physician (Obstetrics and Gynecology) Gillis Ends, MD as Referring Physician (Obstetrics and Gynecology) Oneta Rack, MD as Consulting Physician (Dermatology)  CHIEF COMPLAINT: Recurrent stage IV peritoneal carcinomatosis.  INTERVAL HISTORY: Patient returns to clinic today for further evaluation and discussion of her imaging results.  Her abdominal pain has now resolved and she feels nearly back to her baseline.  She does not complain of weakness or fatigue today. She has no neurologic complaints. She denies any recent fevers. She has a good appetite and has maintained her weight. She has no chest pain or shortness of breath. She denies any nausea, vomiting, constipation, or diarrhea. She has no abdominal pain. She has no urinary complaints. Patient offers no specific complaints today.  REVIEW OF SYSTEMS:   Review of Systems  Constitutional: Negative for fever, malaise/fatigue and weight loss.  Respiratory: Negative.  Negative for cough and shortness of breath.   Cardiovascular: Negative.  Negative for chest pain and leg swelling.  Gastrointestinal: Negative.  Negative for abdominal pain.  Genitourinary: Negative.   Musculoskeletal: Negative.   Skin: Negative.  Negative for rash.  Neurological: Negative for sensory change and weakness.  Psychiatric/Behavioral: Negative.  The patient is not nervous/anxious.    As per HPI. Otherwise, a complete review of systems is negative.  PAST MEDICAL HISTORY: Past Medical History:  Diagnosis Date  . Cancer [facility])    peritoneal carcinoma, breast cancer, liver mets, colon, ovaries  . Hypertension   . Osteoporosis     PAST SURGICAL HISTORY: Past Surgical History:  Procedure Laterality Date  . 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 APPENDECTOMY  1953  . BREAST LUMPECTOMY Left 10/29/94   negative nodes. Treated with radiation only  . BREAST SURGERY    . BUNIONECTOMY  1979  . CATARACT EXTRACTION, BILATERAL     left 07/29/2012; right 06/24/2012  . COLON SURGERY  05/1991   secondary to cancer   . CYSTOSCOPY WITH STENT PLACEMENT Left 11/14/2017   Procedure: CYSTOSCOPY WITH STENT PLACEMENT;  Surgeon: Abbie Sons, MD;  Location: [facility] ORS;  Service: Urology;  Laterality: Left;  . ROTATOR CUFF REPAIR Right 01/25/10  . TONSILLECTOMY  1938   and adenoidectomy    FAMILY HISTORY: Family History  Problem Relation Age of Onset  . CVA Mother   . Congestive Heart Failure Mother   . Diabetes Sister   . Asthma Father     ADVANCED DIRECTIVES (Y/N):  N  HEALTH MAINTENANCE: Social History   Tobacco Use  . Smoking status: Former Smoker    Types: Cigarettes  . Smokeless tobacco: Never Used  Substance Use Topics  . Alcohol use: Yes    Alcohol/week: 4.2 oz    Types: 7 Standard drinks or equivalent per week    Comment: glass or beer at dinner  . Drug use: No     Colonoscopy:  PAP:  Bone density:  Lipid panel:  Allergies  Allergen Reactions  . Actonel [Risedronate Sodium] Other (See Comments)    headache  . Cortisone     very alert and did not sleep for 24  hrs  . Daypro  [Oxaprozin]   . Meloxicam   . Premarin [Conjugated Estrogens]     bleeding  . Prempro [Conj Estrog-Medroxyprogest Ace]     Current Outpatient Medications  Medication Sig Dispense Refill  . acetaminophen (TYLENOL) 500 MG tablet Take 500 mg by mouth every 6 (six) hours as needed.    . cholecalciferol (VITAMIN D) 1000 UNITS tablet Take 2,000 Units by mouth every evening.  With dinner     . Oxycodone HCl 10 MG TABS Take 1 tablet (10 mg total) by mouth every 6 (six) hours as needed (pain). (Patient not taking: Reported on 12/13/2017) 120 tablet 0  . polyethylene glycol (MIRALAX / GLYCOLAX) packet Take 17 g by mouth as needed.    . traMADol (ULTRAM) 50 MG tablet Take 1 tablet (50 mg total) by mouth 3 (three) times daily as needed. 90 tablet 0   No current facility-administered medications for this visit.     OBJECTIVE: There were no vitals filed for this visit.   There is no height or weight on file to calculate BMI.    ECOG FS:0 - Asymptomatic  General: Well-developed, well-nourished, no acute distress. Eyes: Pink conjunctiva, anicteric sclera. Lungs: Clear to auscultation bilaterally. Heart: Regular rate and rhythm. No rubs, murmurs, or gallops. Abdomen: Soft, nontender, nondistended. No organomegaly noted, normoactive bowel sounds. Musculoskeletal: No edema, cyanosis, or clubbing. Neuro: Alert, answering all questions appropriately. Cranial nerves grossly intact. Skin: No rashes or petechiae noted. Psych: Normal affect.   LAB RESULTS:  Lab Results  Component Value Date   NA 132 (L) 11/14/2017   K 3.9 11/14/2017   CL 98 (L) 11/14/2017   CO2 24 11/14/2017   GLUCOSE 175 (H) 11/14/2017   BUN 22 (H) 11/14/2017   CREATININE 1.18 (H) 11/14/2017   CALCIUM 9.1 11/14/2017   PROT 7.5 11/14/2017   ALBUMIN 4.1 11/14/2017   AST 23 11/14/2017   ALT 13 (L) 11/14/2017   ALKPHOS 61 11/14/2017   BILITOT 1.0 11/14/2017   GFRNONAA 41 (L) 11/14/2017   GFRAA 47 (L) 11/14/2017    Lab Results  Component Value Date   WBC 7.2 11/14/2017   NEUTROABS 1.6 09/30/2017   HGB 12.2 11/14/2017   HCT 36.5 11/14/2017   MCV 92.6 11/14/2017   PLT 237 11/14/2017   Lab Results  Component Value Date   CA125 22.3 05/08/2017     STUDIES: No results found.  ASSESSMENT: Recurrent stage IV peritoneal carcinomatosis with metastatic lesions in liver and lung. PLAN:     1.  Recurrent stage IV peritoneal carcinomatosis: Patient completed 4 cycles of carboplatinum and Taxol on May 08, 2017. Restaging CT scan on June 13, 2017 reviewed independently with significant improvement of patient's disease burden, although she had a left inguinal lymph node that revealed persistent disease.  She then received 3 additional cycles of carboplatinum and Taxol completing cycle 7 on August 28, 2017.  Patient's most recent CT scan on November 13, 2017 revealed widespread metastatic disease with lesions in her lungs and liver.  Her CA-125 is also trending up.  Patient was offered both enrollment in a clinical trial as well as palliative chemotherapy using Doxil plus Avastin, but she has declined all treatment.  Patient is not ready to enroll in hospice at this time, but will call clinic once her performance status begins to decline.  She expressed understanding that she can change her mind at any time if she wishes to pursue treatment.  No further follow-up to have  been scheduled.   Approximately 30 minutes was spent in discussion, which greater than 50% was consultation.  Patient expressed understanding and was in agreement with this plan. She also understands that She can call clinic at any time with any questions, concerns, or complaints.   Cancer Staging Peritoneal carcinomatosis [facility]) Staging form: Soft Tissue Sarcoma of the Abdomen and Thoracic Visceral Organs, AJCC 8th Edition - Clinical stage from 02/13/2017: Arnoldo Hooker, cM1 - Signed by Lloyd Huger, MD on 12/27/2017   Lloyd Huger, MD   12/27/2017 10:16 PM

## 2017-12-28 ENCOUNTER — Emergency Department: Payer: Medicare Other

## 2017-12-28 ENCOUNTER — Inpatient Hospital Stay
Admission: EM | Admit: 2017-12-28 | Discharge: 2017-12-31 | DRG: 389 | Disposition: A | Discharge status: Hospice | Payer: Medicare Other | Attending: Internal Medicine | Admitting: Internal Medicine

## 2017-12-28 ENCOUNTER — Encounter: Payer: Self-pay | Admitting: Emergency Medicine

## 2017-12-28 ENCOUNTER — Other Ambulatory Visit: Payer: Self-pay

## 2017-12-28 DIAGNOSIS — K56609 Unspecified intestinal obstruction, unspecified as to partial versus complete obstruction: Secondary | ICD-10-CM | POA: Diagnosis present

## 2017-12-28 DIAGNOSIS — Z888 Allergy status to other drugs, medicaments and biological substances status: Secondary | ICD-10-CM

## 2017-12-28 DIAGNOSIS — Z923 Personal history of irradiation: Secondary | ICD-10-CM

## 2017-12-28 DIAGNOSIS — G893 Neoplasm related pain (acute) (chronic): Secondary | ICD-10-CM

## 2017-12-28 DIAGNOSIS — R109 Unspecified abdominal pain: Secondary | ICD-10-CM

## 2017-12-28 DIAGNOSIS — M810 Age-related osteoporosis without current pathological fracture: Secondary | ICD-10-CM | POA: Diagnosis present

## 2017-12-28 DIAGNOSIS — Z853 Personal history of malignant neoplasm of breast: Secondary | ICD-10-CM

## 2017-12-28 DIAGNOSIS — Z79899 Other long term (current) drug therapy: Secondary | ICD-10-CM

## 2017-12-28 DIAGNOSIS — R682 Dry mouth, unspecified: Secondary | ICD-10-CM | POA: Diagnosis not present

## 2017-12-28 DIAGNOSIS — K56600 Partial intestinal obstruction, unspecified as to cause: Principal | ICD-10-CM | POA: Diagnosis present

## 2017-12-28 DIAGNOSIS — C786 Secondary malignant neoplasm of retroperitoneum and peritoneum: Secondary | ICD-10-CM | POA: Diagnosis present

## 2017-12-28 DIAGNOSIS — Z66 Do not resuscitate: Secondary | ICD-10-CM

## 2017-12-28 DIAGNOSIS — C7800 Secondary malignant neoplasm of unspecified lung: Secondary | ICD-10-CM | POA: Diagnosis present

## 2017-12-28 DIAGNOSIS — I10 Essential (primary) hypertension: Secondary | ICD-10-CM | POA: Diagnosis present

## 2017-12-28 DIAGNOSIS — C787 Secondary malignant neoplasm of liver and intrahepatic bile duct: Secondary | ICD-10-CM

## 2017-12-28 DIAGNOSIS — Z87891 Personal history of nicotine dependence: Secondary | ICD-10-CM

## 2017-12-28 DIAGNOSIS — Z515 Encounter for palliative care: Secondary | ICD-10-CM

## 2017-12-28 DIAGNOSIS — N1330 Unspecified hydronephrosis: Secondary | ICD-10-CM | POA: Diagnosis present

## 2017-12-28 LAB — CBC WITH DIFFERENTIAL/PLATELET
BASOS ABS: 0 10*3/uL (ref 0–0.1)
Basophils Relative: 1 %
Eosinophils Absolute: 0 10*3/uL (ref 0–0.7)
Eosinophils Relative: 0 %
HCT: 34.7 % — ABNORMAL LOW (ref 35.0–47.0)
HEMOGLOBIN: 11.6 g/dL — AB (ref 12.0–16.0)
LYMPHS ABS: 0.1 10*3/uL — AB (ref 1.0–3.6)
LYMPHS PCT: 1 %
MCH: 29.7 pg (ref 26.0–34.0)
MCHC: 33.6 g/dL (ref 32.0–36.0)
MCV: 88.5 fL (ref 80.0–100.0)
Monocytes Absolute: 0.4 10*3/uL (ref 0.2–0.9)
Monocytes Relative: 7 %
Neutro Abs: 4.8 10*3/uL (ref 1.4–6.5)
Neutrophils Relative %: 91 %
Platelets: 284 10*3/uL (ref 150–440)
RBC: 3.92 MIL/uL (ref 3.80–5.20)
RDW: 14.3 % (ref 11.5–14.5)
WBC: 5.3 10*3/uL (ref 3.6–11.0)

## 2017-12-28 LAB — COMPREHENSIVE METABOLIC PANEL
ALK PHOS: 75 U/L (ref 38–126)
ALT: 24 U/L (ref 14–54)
AST: 31 U/L (ref 15–41)
Albumin: 3.5 g/dL (ref 3.5–5.0)
Anion gap: 14 (ref 5–15)
BILIRUBIN TOTAL: 1.8 mg/dL — AB (ref 0.3–1.2)
BUN: 25 mg/dL — AB (ref 6–20)
CALCIUM: 8.8 mg/dL — AB (ref 8.9–10.3)
CO2: 24 mmol/L (ref 22–32)
CREATININE: 0.98 mg/dL (ref 0.44–1.00)
Chloride: 97 mmol/L — ABNORMAL LOW (ref 101–111)
GFR calc Af Amer: 59 mL/min — ABNORMAL LOW (ref >60)
GFR, EST NON AFRICAN AMERICAN: 51 mL/min — AB (ref >60)
Glucose, Bld: 128 mg/dL — ABNORMAL HIGH (ref 65–99)
Potassium: 3.8 mmol/L (ref 3.5–5.1)
Sodium: 135 mmol/L (ref 135–145)
TOTAL PROTEIN: 6.6 g/dL (ref 6.5–8.1)

## 2017-12-28 LAB — LIPASE, BLOOD: Lipase: 18 U/L (ref 11–51)

## 2017-12-28 MED ORDER — ONDANSETRON HCL 4 MG/2ML IJ SOLN
4.0000 mg | Freq: Once | INTRAMUSCULAR | Status: AC | PRN
  Administered 2017-12-29: 4 mg via INTRAVENOUS
  Filled 2017-12-28 (×2): qty 2

## 2017-12-28 MED ORDER — IOPAMIDOL (ISOVUE-300) INJECTION 61%
15.0000 mL | INTRAVENOUS | Status: AC
  Administered 2017-12-28 (×2): 15 mL via ORAL

## 2017-12-28 MED ORDER — SODIUM CHLORIDE 0.9 % IV BOLUS (SEPSIS)
500.0000 mL | Freq: Once | INTRAVENOUS | Status: AC
  Administered 2017-12-28: 500 mL via INTRAVENOUS

## 2017-12-28 MED ORDER — MORPHINE SULFATE (PF) 4 MG/ML IV SOLN
INTRAVENOUS | Status: AC
  Administered 2017-12-28: 4 mg via INTRAVENOUS
  Filled 2017-12-28: qty 1

## 2017-12-28 MED ORDER — ONDANSETRON HCL 4 MG/2ML IJ SOLN
4.0000 mg | Freq: Once | INTRAMUSCULAR | Status: AC
  Administered 2017-12-28: 4 mg via INTRAVENOUS

## 2017-12-28 MED ORDER — IOPAMIDOL (ISOVUE-300) INJECTION 61%
75.0000 mL | Freq: Once | INTRAVENOUS | Status: AC | PRN
  Administered 2017-12-28: 75 mL via INTRAVENOUS

## 2017-12-28 MED ORDER — MORPHINE SULFATE (PF) 4 MG/ML IV SOLN
4.0000 mg | Freq: Once | INTRAVENOUS | Status: AC
  Administered 2017-12-28: 4 mg via INTRAVENOUS

## 2017-12-28 NOTE — ED Triage Notes (Signed)
Patient presents to Emergency Department via EMS from [facility] (independent portion) with complaints of abdominal pain with Nausea and Vomiting x 2. 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    Pt had uretal stent placed and has been urinating blood   Pt called EMS at request of PCP (Dr. Mike Gip) for pain control, pt also requesting consultation for hospice and palliative care.   Pt took 10 mg oxycodone at 1930 without relief  EMS gave 4 zofran IV for nausea

## 2017-12-28 NOTE — ED Provider Notes (Signed)
[facility] Emergency Department Provider Note ____________________________________________   First MD Initiated Contact with Patient 12/28/17 2118     (approximate)  I have reviewed the triage vital signs and the nursing notes.   HISTORY  Chief Complaint Abdominal Pain and Cancer    HPI Lisa Hayden is a 84 y.o. female with history of metastatic ovarian cancer with multiple metastases and peritoneal carcinoma who presents with abdominal pain, primarily lower abdomen, constant over the last 2 days, and associated with an episode of vomiting today in which she apparently vomited food that she ate yesterday.  She also reports hematuria and states that she had a ureteral stent placed last month.  She denies fever, diarrhea or change in bowel movements, chest pain, difficulty breathing, or headache.  The patient contacted the physician on call for the cancer center who recommended that she come to the emergency department for pain control as well as consultation for hospice and palliative care.  Past Medical History:  Diagnosis Date  . Cancer [facility])    peritoneal carcinoma, breast cancer, liver mets, colon, ovaries  . Hypertension   . Osteoporosis     Patient Active Problem List   Diagnosis Date Noted  . Hydronephrosis   . Primary malignant neoplasm of ovary with widespread metastatic disease ([place])   . Palliative care encounter   . Encounter for hospice care discussion   . Goals of care, counseling/discussion   . Intractable abdominal pain 11/14/2017  . Malignant neoplasm of ovary ([place]) 06/26/2017  . 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 Protein-calorie malnutrition, severe 01/18/2017  . Peritoneal carcinomatosis ([place]) 01/16/2017  . SBO (small bowel obstruction) ([place]) 01/16/2017  . Small bowel obstruction ([place])   . Malignant neoplasm of colon ([place]) 05/08/2016  . Reactive airway disease 05/04/2015  . Bronchitis 05/04/2015  . Asthma  without status asthmaticus 05/03/2015  . Back ache 05/03/2015  . Body mass index (BMI) of 24.0-24.9 in adult 05/03/2015  . Breast CA ([place]) 05/03/2015  . Blood pressure elevated 05/03/2015  . Foot pain 05/03/2015  . BP (high blood pressure) 05/03/2015  . Leg pain 05/03/2015  . Lichen sclerosus of female genitalia 05/03/2015  . Neutropenia ([place]) 05/03/2015  . Tendinitis of wrist 05/03/2015  . Atrophy of vagina 05/03/2015  . Avitaminosis D 05/03/2015  . CN (constipation) 01/11/2010  . Mechanical and motor problems with internal organs 09/21/2009  . Arthralgia of shoulder 08/31/2009  . Personal history of malignant neoplasm of breast 04/13/2009  . OP (osteoporosis) 04/13/2009    Past Surgical History:  Procedure Laterality Date  . 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 APPENDECTOMY  1953  . BREAST LUMPECTOMY Left 10/29/94   negative nodes. Treated with radiation only  . BREAST SURGERY    . BUNIONECTOMY  1979  . CATARACT EXTRACTION, BILATERAL     left 07/29/2012; right 06/24/2012  . COLON SURGERY  05/1991   secondary to cancer   . CYSTOSCOPY WITH STENT PLACEMENT Left 11/14/2017   Procedure: CYSTOSCOPY WITH STENT PLACEMENT;  Surgeon: Abbie Sons, MD;  Location: [facility] ORS;  Service: Urology;  Laterality: Left;  . ROTATOR CUFF REPAIR Right 01/25/10  . TONSILLECTOMY  1938   and adenoidectomy    Prior to Admission medications   Medication Sig Start Date End Date Taking? Authorizing Provider  acetaminophen (TYLENOL) 500 MG tablet Take 500 mg by mouth every 6 (six) hours as needed.  [provider]  cholecalciferol (VITAMIN D) 1000 UNITS tablet Take 2,000 Units by mouth every evening. With dinner     [provider]  Oxycodone HCl 10 MG TABS Take 1 tablet (10 mg total) by mouth every 6 (six) hours as needed (pain). Patient not taking: Reported on 12/13/2017 11/21/17   Lloyd Huger, MD  polyethylene glycol  [facility] Floria Raveling) packet Take 17 g by mouth as needed.    [provider]  traMADol (ULTRAM) 50 MG tablet Take 1 tablet (50 mg total) by mouth 3 (three) times daily as needed. 12/16/17   Lloyd Huger, MD    Allergies Actonel [risedronate sodium]; Cortisone; Daypro  [oxaprozin]; Meloxicam; Premarin [conjugated estrogens]; and Prempro [conj estrog-medroxyprogest ace]  Family History  Problem Relation Age of Onset  . CVA Mother   . Congestive Heart Failure Mother   . Diabetes Sister   . Asthma Father     Social History Social History   Tobacco Use  . Smoking status: Former Smoker    Types: Cigarettes  . Smokeless tobacco: Never Used  Substance Use Topics  . Alcohol use: Yes    Alcohol/week: 4.2 oz    Types: 7 Standard drinks or equivalent per week    Comment: glass or beer at dinner  . Drug use: No    Review of Systems  Constitutional: No fever. Eyes: No redness. ENT: No sore throat. Cardiovascular: Denies chest pain. Respiratory: Denies shortness of breath. Gastrointestinal: Positive for vomiting..  Genitourinary: Negative for hematuria.  Musculoskeletal: Negative for back pain. Skin: Negative for rash. Neurological: Negative for headache.   ____________________________________________   PHYSICAL EXAM:  VITAL SIGNS: ED Triage Vitals  Enc Vitals Group     BP 12/28/17 2102 (!) 142/83     Pulse Rate 12/28/17 2102 90     Resp --      Temp 12/28/17 2102 98.2 F (36.8 C)     Temp Source 12/28/17 2102 Oral     SpO2 12/28/17 2050 98 %     Weight 12/28/17 2104 115 lb (52.2 kg)     Height 12/28/17 2104 5\' 2"  (1.575 m)     Head Circumference --      Peak Flow --      Pain Score 12/28/17 2103 10     Pain Loc --      Pain Edu? --      Excl. in [place]? --     Constitutional: Alert and oriented.  Somewhat weak appearing, but in no acute distress. Eyes: Conjunctivae are normal.  No scleral icterus. Head: Atraumatic. Nose: No  congestion/rhinnorhea. Mouth/Throat: Mucous membranes are slightly dry.   Neck: Normal range of motion.  Cardiovascular:  sounds.  Good peripheral circulation. Respiratory: Normal respiratory effort.  No retractions.  Gastrointestinal: Soft with moderate bilateral lower quadrant tenderness and mild distention.  Genitourinary: No flank tenderness. Musculoskeletal:  Extremities warm and well perfused.  Neurologic:  Normal speech and language. No gross focal neurologic deficits are appreciated.  Skin:  Skin is warm and dry. No rash noted. Psychiatric: Mood and affect are normal. Speech and behavior are normal.  ____________________________________________   LABS (all labs ordered are listed, but only abnormal results are displayed)  Labs Reviewed  COMPREHENSIVE METABOLIC PANEL - Abnormal; Notable for the following components:      Result Value   Chloride 97 (*)    Glucose, Bld 128 (*)    BUN 25 (*)    Calcium 8.8 (*)  Total Bilirubin 1.8 (*)    GFR calc non Af Amer 51 (*)    GFR calc Af Amer 59 (*)    All other components within normal limits  CBC WITH DIFFERENTIAL/PLATELET - Abnormal; Notable for the following components:   Hemoglobin 11.6 (*)    HCT 34.7 (*)    Lymphs Abs 0.1 (*)    All other components within normal limits  LIPASE, BLOOD  URINALYSIS, COMPLETE (UACMP) WITH MICROSCOPIC   ____________________________________________  EKG   ____________________________________________  RADIOLOGY  CT abdomen: Partial small bowel obstruction  ____________________________________________   PROCEDURES  Procedure(s) performed: No  Procedures  Critical Care performed: No ____________________________________________   INITIAL IMPRESSION / ASSESSMENT AND PLAN / ED COURSE  Pertinent labs & imaging results that were available during my care of the patient were reviewed by me and considered in my medical decision making (see chart for details).  84 year old female  with metastatic cancer presents with worsening bilateral lower quadrant abdominal pain over the last 2 days, as well as one episode of vomiting today.  Patient contacted the oncologist on-call for her practice and was instructed to come to the emergency department for pain control as well as consultation for hospice and palliative care.  I reviewed the past medical records in epic; I confirmed the past history as noted above.  The patient most recently had a CT on 11/13/2017 demonstrating hepatic and pulmonary metastases, retroperitoneal lymphadenopathy, and hydronephrosis concerning for obstruction (which was subsequently treated with the stent).  On exam here, vital signs are normal, the patient is chronically weak but not acutely ill-appearing, and abdomen is soft but somewhat tender especially in the lower quadrants.  Differential includes pain from metastatic disease, small bowel obstruction, colitis, diverticulitis, UTI/cystitis, or constipation.  Plan for labs, UA, analgesia, and fluids.  Although patient has metastatic disease and is being considered for possible palliative care and/or hospice, I will obtain a CT to rule out possible reversible causes such as colitis that could be treated with antibiotics or small bowel obstruction that could be treated with NG tube.  Plan for admission for palliative/hospice consultation.    ----------------------------------------- 12:22 AM on 12/29/2017 -----------------------------------------  CT reveals partial small bowel obstruction.  There is also worsening invasive carcinoma.  I consulted Dr. Burt Knack from surgery who advises that the patient is nonoperative, and that any treatment such as NG tube can be per the patient's preference.  He recommends admission to the hospital service.  I discussed the CT results and plan of care with the patient.  Patient declines NG tube at this time.  She has had one previously, so she is well aware of the risks and  benefits.  We will continue to treat symptomatically.  I signed the patient out to the hospitalist Dr. Jannifer Franklin.  ____________________________________________   FINAL CLINICAL IMPRESSION(S) / ED DIAGNOSES  Final diagnoses:  Small bowel obstruction ([place])  Abdominal pain, unspecified abdominal location      NEW MEDICATIONS STARTED DURING THIS VISIT:  New Prescriptions   No medications on file     Note:  This document was prepared using Dragon voice recognition software and may include unintentional dictation errors.     Arta Silence, MD 12/29/17 [phone]

## 2017-12-29 ENCOUNTER — Other Ambulatory Visit: Payer: Self-pay

## 2017-12-29 DIAGNOSIS — Z66 Do not resuscitate: Secondary | ICD-10-CM | POA: Diagnosis present

## 2017-12-29 DIAGNOSIS — Z923 Personal history of irradiation: Secondary | ICD-10-CM | POA: Diagnosis not present

## 2017-12-29 DIAGNOSIS — C801 Malignant (primary) neoplasm, unspecified: Secondary | ICD-10-CM

## 2017-12-29 DIAGNOSIS — Z515 Encounter for palliative care: Secondary | ICD-10-CM | POA: Diagnosis not present

## 2017-12-29 DIAGNOSIS — M810 Age-related osteoporosis without current pathological fracture: Secondary | ICD-10-CM | POA: Diagnosis present

## 2017-12-29 DIAGNOSIS — Z79899 Other long term (current) drug therapy: Secondary | ICD-10-CM | POA: Diagnosis not present

## 2017-12-29 DIAGNOSIS — Z87891 Personal history of nicotine dependence: Secondary | ICD-10-CM | POA: Diagnosis not present

## 2017-12-29 DIAGNOSIS — N1330 Unspecified hydronephrosis: Secondary | ICD-10-CM | POA: Diagnosis present

## 2017-12-29 DIAGNOSIS — C787 Secondary malignant neoplasm of liver and intrahepatic bile duct: Secondary | ICD-10-CM | POA: Diagnosis not present

## 2017-12-29 DIAGNOSIS — C7800 Secondary malignant neoplasm of unspecified lung: Secondary | ICD-10-CM | POA: Diagnosis present

## 2017-12-29 DIAGNOSIS — C569 Malignant neoplasm of unspecified ovary: Secondary | ICD-10-CM | POA: Diagnosis not present

## 2017-12-29 DIAGNOSIS — J90 Pleural effusion, not elsewhere classified: Secondary | ICD-10-CM | POA: Diagnosis not present

## 2017-12-29 DIAGNOSIS — Z888 Allergy status to other drugs, medicaments and biological substances status: Secondary | ICD-10-CM | POA: Diagnosis not present

## 2017-12-29 DIAGNOSIS — Z853 Personal history of malignant neoplasm of breast: Secondary | ICD-10-CM

## 2017-12-29 DIAGNOSIS — K56609 Unspecified intestinal obstruction, unspecified as to partial versus complete obstruction: Secondary | ICD-10-CM | POA: Diagnosis not present

## 2017-12-29 DIAGNOSIS — C786 Secondary malignant neoplasm of retroperitoneum and peritoneum: Secondary | ICD-10-CM | POA: Diagnosis not present

## 2017-12-29 DIAGNOSIS — K56600 Partial intestinal obstruction, unspecified as to cause: Secondary | ICD-10-CM | POA: Diagnosis not present

## 2017-12-29 DIAGNOSIS — R682 Dry mouth, unspecified: Secondary | ICD-10-CM | POA: Diagnosis not present

## 2017-12-29 DIAGNOSIS — R109 Unspecified abdominal pain: Secondary | ICD-10-CM | POA: Diagnosis not present

## 2017-12-29 DIAGNOSIS — I10 Essential (primary) hypertension: Secondary | ICD-10-CM | POA: Diagnosis present

## 2017-12-29 DIAGNOSIS — G893 Neoplasm related pain (acute) (chronic): Secondary | ICD-10-CM | POA: Diagnosis not present

## 2017-12-29 LAB — URINALYSIS, COMPLETE (UACMP) WITH MICROSCOPIC
Bacteria, UA: NONE SEEN
Bilirubin Urine: NEGATIVE
Glucose, UA: NEGATIVE mg/dL
Ketones, ur: 20 mg/dL — AB
Leukocytes, UA: NEGATIVE
Nitrite: NEGATIVE
PROTEIN: 30 mg/dL — AB
SQUAMOUS EPITHELIAL / LPF: NONE SEEN
Specific Gravity, Urine: 1.025 (ref 1.005–1.030)
pH: 5 (ref 5.0–8.0)

## 2017-12-29 LAB — TSH: TSH: 0.954 u[IU]/mL (ref 0.350–4.500)

## 2017-12-29 MED ORDER — FLEET ENEMA 7-19 GM/118ML RE ENEM
1.0000 | ENEMA | Freq: Every day | RECTAL | Status: DC | PRN
  Administered 2017-12-31: 07:00:00 1 via RECTAL
  Filled 2017-12-29: qty 1

## 2017-12-29 MED ORDER — SODIUM CHLORIDE 0.9 % IV SOLN
INTRAVENOUS | Status: DC
  Administered 2017-12-29 – 2017-12-30 (×4): via INTRAVENOUS

## 2017-12-29 MED ORDER — ONDANSETRON HCL 4 MG PO TABS: 4.0000 mg | ORAL_TABLET | Freq: Four times a day (QID) | ORAL | Status: DC | PRN

## 2017-12-29 MED ORDER — MORPHINE SULFATE (PF) 4 MG/ML IV SOLN
INTRAVENOUS | Status: AC
  Administered 2017-12-29: 4 mg via INTRAVENOUS
  Filled 2017-12-29: qty 1

## 2017-12-29 MED ORDER — MORPHINE SULFATE (PF) 4 MG/ML IV SOLN
4.0000 mg | Freq: Once | INTRAVENOUS | Status: AC
  Administered 2017-12-29: 4 mg via INTRAVENOUS

## 2017-12-29 MED ORDER — ACETAMINOPHEN 650 MG RE SUPP: 650.0000 mg | Freq: Four times a day (QID) | RECTAL | Status: DC | PRN

## 2017-12-29 MED ORDER — ENOXAPARIN SODIUM 40 MG/0.4ML ~~LOC~~ SOLN
40.0000 mg | SUBCUTANEOUS | Status: DC
  Filled 2017-12-29: qty 0.4

## 2017-12-29 MED ORDER — DOCUSATE SODIUM 100 MG PO CAPS
100.0000 mg | ORAL_CAPSULE | Freq: Two times a day (BID) | ORAL | Status: DC
  Administered 2017-12-29 – 2017-12-30 (×2): 100 mg via ORAL
  Filled 2017-12-29 (×4): qty 1

## 2017-12-29 MED ORDER — MORPHINE SULFATE (PF) 2 MG/ML IV SOLN
2.0000 mg | INTRAVENOUS | Status: DC | PRN
  Administered 2017-12-29 – 2017-12-30 (×8): 2 mg via INTRAVENOUS
  Filled 2017-12-29 (×8): qty 1

## 2017-12-29 MED ORDER — OXYCODONE HCL 5 MG PO TABS: 10.0000 mg | ORAL_TABLET | Freq: Four times a day (QID) | ORAL | Status: DC | PRN

## 2017-12-29 MED ORDER — ONDANSETRON HCL 4 MG/2ML IJ SOLN
4.0000 mg | Freq: Four times a day (QID) | INTRAMUSCULAR | Status: DC | PRN
  Administered 2017-12-29: 4 mg via INTRAVENOUS
  Filled 2017-12-29: qty 2

## 2017-12-29 MED ORDER — ACETAMINOPHEN 325 MG PO TABS: 650.0000 mg | ORAL_TABLET | Freq: Four times a day (QID) | ORAL | Status: DC | PRN

## 2017-12-29 NOTE — Progress Notes (Signed)
[facility]                                                                                                                                                                                  Patient Demographics   Melanni Benway, is a 84 y.o. female, DOB - 09/18/1930, [place] date - 12/28/2017   Admitting Physician Harrie Foreman, MD  Outpatient Primary MD for the patient is Birdie Sons, MD   LOS - 0  Subjective: Patient admitted with small bowel obstruction states that abdominal pain now resolved Passing some gas    Review of Systems:   CONSTITUTIONAL: No documented fever. No fatigue, weakness. No weight gain, no weight loss.  EYES: No blurry or double vision.  ENT: No tinnitus. No postnasal drip. No redness of the oropharynx.  RESPIRATORY: No cough, no wheeze, no hemoptysis. No dyspnea.  CARDIOVASCULAR: No chest pain. No orthopnea. No palpitations. No syncope.  GASTROINTESTINAL: No nausea, no vomiting or diarrhea.  Positive abdominal pain. No melena or hematochezia.  GENITOURINARY: No dysuria or hematuria.  ENDOCRINE: No polyuria or nocturia. No heat or cold intolerance.  HEMATOLOGY: No anemia. No bruising. No bleeding.  INTEGUMENTARY: No rashes. No lesions.  MUSCULOSKELETAL: No arthritis. No swelling. No gout.  NEUROLOGIC: No numbness, tingling, or ataxia. No seizure-type activity.  PSYCHIATRIC: No anxiety. No insomnia. No ADD.    Vitals:   Vitals:   12/29/17 0045 12/29/17 0231 12/29/17 0332 12/29/17 0541  BP: 130/83 124/84 129/81 129/65  Pulse: 90 94 98 91  Resp: 18 18 16 18   Temp:   97.8 F (36.6 C) 98.2 F (36.8 C)  TempSrc:   Oral Oral  SpO2: 94% 93% 99% 91%  Weight:   114 lb 2 oz (51.8 kg)   Height:        Wt Readings from Last 3 Encounters:  12/29/17 114 lb 2 oz (51.8 kg)  12/13/17 113 lb 3.2 oz (51.3 kg)  11/21/17 119 lb 1.6 oz (54 kg)     Intake/Output Summary (Last 24 hours) at 12/29/2017  1303 Last data filed at 12/28/2017 2319 Gross per 24 hour  Intake 500 ml  Output -  Net 500 ml    Physical Exam:   GENERAL: Pleasant-appearing in no apparent distress.  HEAD, EYES, EARS, NOSE AND THROAT: Atraumatic, normocephalic. Extraocular muscles are intact. Pupils equal and reactive to light. Sclerae anicteric. No conjunctival injection. No oro-pharyngeal erythema.  NECK: Supple. There is no jugular venous distention. No bruits, no lymphadenopathy, no thyromegaly.  HEART: Regular rate and rhythm,. No murmurs, no rubs, no clicks.  LUNGS: Clear to auscultation bilaterally. No rales or  rhonchi. No wheezes.  ABDOMEN: Soft, decreased bowel sounds distended no guarding no rebound. Has good bowel sounds. No hepatosplenomegaly appreciated.  EXTREMITIES: No evidence of any cyanosis, clubbing, or peripheral edema.  +2 pedal and radial pulses bilaterally.  NEUROLOGIC: The patient is alert, awake, and oriented x3 with no focal motor or sensory deficits appreciated bilaterally.  SKIN: Moist and warm with no rashes appreciated.  Psych: Not anxious, depressed LN: No inguinal LN enlargement    Antibiotics   Anti-infectives (From admission, onward)   None      Medications   Scheduled Meds: . docusate sodium  100 mg Oral BID  . enoxaparin (LOVENOX) injection  40 mg Subcutaneous Q24H   Continuous Infusions: . sodium chloride 100 mL/hr at 12/29/17 1241   PRN Meds:.acetaminophen **OR** acetaminophen, morphine injection, ondansetron **OR** ondansetron (ZOFRAN) IV, oxyCODONE, sodium phosphate   Data Review:   Micro Results No results found for this or any previous visit (from the past 240 hour(s)).  Radiology Reports Ct Abdomen Pelvis W Contrast  Result Date: 12/28/2017 CLINICAL DATA:  Patient presents to Emergency Department via EMS from [facility] (independent portion) with complaints of abdominal pain with Nausea and Vomiting x 2. 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 EXAM: CT ABDOMEN AND PELVIS WITH CONTRAST TECHNIQUE: Multidetector CT imaging of the abdomen and pelvis was performed using the standard protocol following bolus administration of intravenous contrast. CONTRAST:  38mL ISOVUE-300 IOPAMIDOL (ISOVUE-300) INJECTION 61% COMPARISON:  11/13/2017 FINDINGS: Lower chest: Small pleural effusions. There is associated dependent lower lobe atelectasis. There are several lung base nodules larger than on the prior CT. A pleural-based left lateral lower lobe nodule, image 10, series 4, currently measures 8 mm in long axis, previously 5 mm. Hepatobiliary: There are numerous liver masses consistent with widespread metastatic disease. This has significantly worsened when compared to the prior CT. Several reference measurements were made. There is a lesion in the lateral segment of the left lobe, image 13, series 2, measuring 19 mm, previously 13 mm. Lesion in the right lobe, image 24, measures 3 cm, previously 2 cm. Image more inferiorly in the right lobe also measures 3 cm, previously 2 cm. Are multiple small new masses. Multiple gallstones. No evidence of acute cholecystitis. No bile duct dilation. Pancreas: No pancreatic mass or inflammation. Spleen: Normal in size without focal abnormality. Adrenals/Urinary Tract: 16 mm left adrenal mass is stable from the prior study. Normal right adrenal gland. Moderate left hydronephrosis. This is despite the presence of a left ureteral stent, which is well positioned. Left kidney shows decreased enhancement compared to the right and no contrast in the collecting system on the delayed sequence. No right hydronephrosis. No renal masses. Right ureters normal in course and in caliber. Bladder is unremarkable. Stomach/Bowel: There is a small bowel anastomosis staple line in the left pelvis, unchanged. Stomach is moderately distended. No wall thickening or inflammation. There is mild dilation of the small bowel with multiple air-fluid levels,  maximum diameter 3.2 cm. The terminal ileum is decompressed. Apparent obstruction point is in the left pelvis adjacent to the sigmoid colon. There is an area of relative narrowing of the sigmoid colon in the same location. Colon proximal to this is mildly distended and shows increased stool. Vascular/Lymphatic: Several prominent lymph nodes along the gastrohepatic ligament, none pathologically enlarged by size criteria. No discrete enlarged lymph nodes. Reproductive: Status post hysterectomy. Other: There is abnormal soft tissue that extends along the left retroperitoneum between the kidney  and aorta, abutting the left psoas muscle and surrounding the left ureter. This extends into the left pelvis. No ascites. Musculoskeletal: No fracture or acute finding. No osteoblastic or osteolytic lesions. IMPRESSION: 1. Worsening locally invasive and metastatic carcinoma. There is increased abnormal soft tissue along the left retroperitoneum consistent with local spread of carcinoma. This surrounds the left ureter. It also abuts the mid sigmoid colon, which appears narrowed. Lung base nodules have increased in size. There are enlarged and and new liver metastatic lesions. 2. There is a partial small bowel obstruction. The transition point appears to be in the left pelvis, likely also due to the local spread of retroperitoneal carcinoma. The narrowed area of the sigmoid colon appears to cause a low grade partial colonic obstruction, with increased stool noted throughout the colon above the area of narrowing. 3. Left hydronephrosis, with decreased enhancement and excretion of the left kidney, despite the presence of a well-positioned left ureteral stent. Stent is new since the prior CT. 4. New bilateral pleural effusions. Electronically Signed   By: Lajean Manes M.D.   On: 12/28/2017 23:55     CBC Recent Labs  Lab 12/28/17 2108  WBC 5.3  HGB 11.6*  HCT 34.7*  PLT 284  MCV 88.5  MCH 29.7  MCHC 33.6  RDW 14.3   LYMPHSABS 0.1*  MONOABS 0.4  EOSABS 0.0  BASOSABS 0.0    Chemistries  Recent Labs  Lab 12/28/17 2108  NA 135  K 3.8  CL 97*  CO2 24  GLUCOSE 128*  BUN 25*  CREATININE 0.98  CALCIUM 8.8*  AST 31  ALT 24  ALKPHOS 75  BILITOT 1.8*   ------------------------------------------------------------------------------------------------------------------ estimated creatinine clearance is 33.2 mL/min (by C-G formula based on SCr of 0.98 mg/dL). ------------------------------------------------------------------------------------------------------------------ No results for input(s): HGBA1C in the last 72 hours. ------------------------------------------------------------------------------------------------------------------ No results for input(s): CHOL, HDL, LDLCALC, TRIG, CHOLHDL, LDLDIRECT in the last 72 hours. ------------------------------------------------------------------------------------------------------------------ Recent Labs    12/28/17 2108  TSH 0.954   ------------------------------------------------------------------------------------------------------------------ No results for input(s): VITAMINB12, FOLATE, FERRITIN, TIBC, IRON, RETICCTPCT in the last 72 hours.  Coagulation profile No results for input(s): INR, PROTIME in the last 168 hours.  No results for input(s): DDIMER in the last 72 hours.  Cardiac Enzymes No results for input(s): CKMB, TROPONINI, MYOGLOBIN in the last 168 hours.  Invalid input(s): CK ------------------------------------------------------------------------------------------------------------------ Invalid input(s): Johnson   This is a 84 year old female admitted for small bowel obstruction. 1.  Partial SBO: Patient initially refused NG tube, at this point continue supportive care continue Colace if no response by later today can give 1 Fleet enema to see if that helps 2.  Peritoneal carcinomatosis: With  metastases to liver and lungs.  Patient seems to have a poor understanding of her current situation she is talking about having surgery considering chemo etc.  I will wait for oncology input as well as palliative care input prognosis very poor  3.  Essential hypertension blood pressure currently stable: Use IV hydralazine as needed 4.    Miscellaneous Lovenox for DVT prophylaxis        Code Status Orders  (From admission, onward)        Start     Ordered   12/29/17 0335  Do not attempt resuscitation (DNR)  Continuous    Question Answer Comment  In the event of cardiac or respiratory ARREST Do not call a "code blue"   In the event of cardiac or respiratory ARREST Do not  perform Intubation, CPR, defibrillation or ACLS   In the event of cardiac or respiratory ARREST Use medication by any route, position, wound care, and other measures to relive pain and suffering. May use oxygen, suction and manual treatment of airway obstruction as needed for comfort.      12/29/17 0334    Code Status History    Date Active Date Inactive Code Status Order ID Comments User Context   12/29/2017 02:59 12/29/2017 02:59 Full Code [id number]  Harrie Foreman, MD ED   11/14/2017 18:13 11/15/2017 15:49 Full Code [id number]  Ammie Dalton, RN Inpatient   11/14/2017 14:54 11/14/2017 18:13 DNR [id number]  Loletha Grayer, MD Inpatient   11/14/2017 04:59 11/14/2017 14:54 Full Code [id number]  Harrie Foreman, MD Inpatient   01/16/2017 01:54 01/22/2017 03:20 Full Code [id number]  Lance Coon, MD Inpatient    Advance Directive Documentation     Most Recent Value  Type of Advance Directive  Out of facility DNR (pink MOST or yellow form)  Pre-existing out of facility DNR order (yellow form or pink MOST form)  Pink MOST form placed in chart (order not valid for inpatient use)  "MOST" Form in Place?  No data           Consults oncology  DVT Prophylaxis  Lovenox   Lab Results  Component Value Date    PLT 284 12/28/2017     Time Spent in minutes 45 minutes  Greater than 50% of time spent in care coordination and counseling patient regarding the condition and plan of care.   Dustin Flock M.D on 12/29/2017 at 1:03 PM  Between 7am to 6pm - Pager - [phone]  After 6pm go to [email or web address] - password [email or web address] [facility]   Office  [phone]

## 2017-12-29 NOTE — Consult Note (Signed)
[facility]  Date of admission:  12/29/2017  Inpatient day:  12/29/2017  Consulting physician:  Dr. Rosilyn Mings   Reason for Consultation:  Palliative chemotx.  Chief Complaint: Lisa Hayden is a 84 y.o. female with recurrent stage IV peritoneal carcinomatosis who was admitted through the emergency with intractable abdominal pain and a partial small bowel obstruction.  HPI:  The patient has stage IV peritoneal carcinomatosis.  She has received 4 cycles of carboplatin and Taxol (last 05/08/2017).  Interval restaging scans revealed improvement.  She received 3 additional cycles.  CT scans on 11/13/2017 revealed widespread metastatic disease with lesions in lung and liver.  She was seen by Dr. Grayland Ormond on 11/21/2017.  Discussions were held about enrollment on a clinical trial or palliative chemotherapy (Doxil + Avastin).  She declined treatment.  She presented to the ER last night with a 2 day history of lower abdominal pain, constipation, and an episode of vomiting.  She describes requiring disimpaction at home for constipation.  She has a ureteral stent in place.  Pain was unrelieved with Tylenol and Tramadol.  Abdomen and pelvic CT on 12/28/2017 revealed worsening locally invasive and metastatic carcinoma. There was increased abnormal soft tissue along the left retroperitoneum consistent with local spread of carcinoma. This surrounds the left ureter. It also abuts the mid sigmoid colon, which appears narrowed. Lung base nodules have increased in size. There were enlarged and new liver metastatic lesions.  There was a partial small bowel obstruction. The transition point appeared to be in the left pelvis, likely also due to the local spread of retroperitoneal carcinoma. The narrowed area of the sigmoid colon appeared to cause a low grade partial colonic obstruction, with increased stool noted throughout the colon above the area of narrowing.  There was left  hydronephrosis, with decreased enhancement and excretion of the left kidney, despite the presence of a well-positioned left ureteral stent.   There are new bilateral pleural effusions.  She declined NG tub placement.  Pain is dramatically better.  She notes a dry mouth.  She denies any fever.   Past Medical History:  Diagnosis Date  . Cancer [facility])    peritoneal carcinoma, breast cancer, liver mets, colon, ovaries  . Hypertension   . Osteoporosis     Past Surgical History:  Procedure Laterality Date  . 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 APPENDECTOMY  1953  . BREAST LUMPECTOMY Left 10/29/94   negative nodes. Treated with radiation only  . BREAST SURGERY    . BUNIONECTOMY  1979  . CATARACT EXTRACTION, BILATERAL     left 07/29/2012; right 06/24/2012  . COLON SURGERY  05/1991   secondary to cancer   . CYSTOSCOPY WITH STENT PLACEMENT Left 11/14/2017   Procedure: CYSTOSCOPY WITH STENT PLACEMENT;  Surgeon: Abbie Sons, MD;  Location: [facility] ORS;  Service: Urology;  Laterality: Left;  . ROTATOR CUFF REPAIR Right 01/25/10  . TONSILLECTOMY  1938   and adenoidectomy    Family History  Problem Relation Age of Onset  . CVA Mother   . Congestive Heart Failure Mother   . Diabetes Sister   . Asthma Father     Social History:  reports that she has quit smoking. Her smoking use included cigarettes. she has never used smokeless tobacco. She reports that she drinks about 4.2 oz of alcohol per week. She reports that she does not use drugs.  She lives at [facility].  The patient is accompanied by her 30 year old husband and a friend/neighbor.  Allergies:  Allergies  Allergen Reactions  . Actonel [Risedronate Sodium] Other (See Comments)    headache  . Cortisone Other (See Comments)    very alert and did not sleep for 24 hrs  . Daypro [Oxaprozin] Other (See Comments)    Reaction: unknown  . Meloxicam Other (See Comments)    Reaction:  unknown  . Premarin [Conjugated Estrogens] Other (See Comments)    bleeding  . Prempro [Conj Estrog-Medroxyprogest Ace] Other (See Comments)    Reaction: unknown    Medications Prior to Admission  Medication Sig Dispense Refill  . acetaminophen (TYLENOL) 500 MG tablet Take 500 mg by mouth every 6 (six) hours as needed for mild pain or fever.     . Oxycodone HCl 10 MG TABS Take 1 tablet (10 mg total) by mouth every 6 (six) hours as needed (pain). 120 tablet 0    Review of Systems: GENERAL:  Fatigue.  No fevers or sweats. PERFORMANCE STATUS (ECOG):  2 HEENT:  No visual changes, runny nose, sore throat, mouth sores or tenderness. Lungs: No shortness of breath or cough.  No hemoptysis. Cardiac:  No chest pain, palpitations, orthopnea, or PND. GI:  Constipation requiring manual disimpaction.  No nausea, diarrhea, melena or hematochezia. GU:  No urgency, frequency, or dysuria.  Stent in place with some hematuria. Musculoskeletal:  No back pain.  No joint pain.  No muscle tenderness. Extremities:  No pain or swelling. Skin:  No rashes or skin changes. Neuro:  No headache, numbness or weakness, balance or coordination issues. Endocrine:  No diabetes, thyroid issues, hot flashes or night sweats. Psych:  No mood changes, depression or anxiety. Pain:  No focal pain. Review of systems:  All other systems reviewed and found to be negative.  Physical Exam:  Blood pressure 129/65, pulse 91, temperature 98.2 F (36.8 C), temperature source Oral, resp. rate 18, height 5' 2"  (1.575 m), weight 114 lb 2 oz (51.8 kg), SpO2 91 %.  GENERAL:  Thin elderly woman lying comfortably on the medical unit in no acute distress. MENTAL STATUS:  Alert and oriented to person, place and time. HEAD:  Thin sparse gray hair.  Normocephalic, atraumatic, face symmetric, no Cushingoid features. EYES:  Green eyes.  Pupils equal round and reactive to light and accomodation.  No conjunctivitis or scleral icterus. ENT:   Oropharynx clear without lesion.  Tongue normal. Mucous membranes dry.  RESPIRATORY:  Decreased breath sounds at the bases.  Clear to auscultation without rales, wheezes or rhonchi. CARDIOVASCULAR:  Regular rate and rhythm without murmur, rub or gallop. ABDOMEN:  Slightly distended.  Soft, minimally tender without guarding or rebound tenderness.  Active bowel sounds, and no appreciable hepatosplenomegaly.  No masses. SKIN:  Pale.  No rashes, ulcers or lesions. EXTREMITIES: No edema, no skin discoloration or tenderness.  No palpable cords. LYMPH NODES: No palpable cervical, supraclavicular, axillary or inguinal adenopathy  NEUROLOGICAL: Unremarkable. PSYCH:  Appropriate.   Results for orders placed or performed during the hospital encounter of 12/28/17 (from the past 48 hour(s))  Lipase, blood     Status: None   Collection Time: 12/28/17  9:08 PM  Result Value Ref Range   Lipase 18 11 - 51 U/L    Comment: Performed at [facility], [address], [place], [place] [address]  Comprehensive metabolic panel     Status: Abnormal   Collection Time: 12/28/17  9:08 PM  Result Value  Ref Range   Sodium 135 135 - 145 mmol/L   Potassium 3.8 3.5 - 5.1 mmol/L   Chloride 97 (L) 101 - 111 mmol/L   CO2 24 22 - 32 mmol/L   Glucose, Bld 128 (H) 65 - 99 mg/dL   BUN 25 (H) 6 - 20 mg/dL   Creatinine, Ser 0.98 0.44 - 1.00 mg/dL   Calcium 8.8 (L) 8.9 - 10.3 mg/dL   Total Protein 6.6 6.5 - 8.1 g/dL   Albumin 3.5 3.5 - 5.0 g/dL   AST 31 15 - 41 U/L   ALT 24 14 - 54 U/L   Alkaline Phosphatase 75 38 - 126 U/L   Total Bilirubin 1.8 (H) 0.3 - 1.2 mg/dL   GFR calc non Af Amer 51 (L) >60 mL/min   GFR calc Af Amer 59 (L) >60 mL/min    Comment: (NOTE) The eGFR has been calculated using the CKD EPI equation. This calculation has not been validated in all clinical situations. eGFR's persistently <60 mL/min signify possible Chronic Kidney Disease.    Anion gap 14 5 - 15    Comment: Performed at  [facility], [address] [place], [place] [address]  CBC with Differential/Platelet     Status: Abnormal   Collection Time: 12/28/17  9:08 PM  Result Value Ref Range   WBC 5.3 3.6 - 11.0 K/uL   RBC 3.92 3.80 - 5.20 MIL/uL   Hemoglobin 11.6 (L) 12.0 - 16.0 g/dL   HCT 34.7 (L) 35.0 - 47.0 %   MCV 88.5 80.0 - 100.0 fL   MCH 29.7 26.0 - 34.0 pg   MCHC 33.6 32.0 - 36.0 g/dL   RDW 14.3 11.5 - 14.5 %   Platelets 284 150 - 440 K/uL   Neutrophils Relative % 91 %   Neutro Abs 4.8 1.4 - 6.5 K/uL   Lymphocytes Relative 1 %   Lymphs Abs 0.1 (L) 1.0 - 3.6 K/uL   Monocytes Relative 7 %   Monocytes Absolute 0.4 0.2 - 0.9 K/uL   Eosinophils Relative 0 %   Eosinophils Absolute 0.0 0 - 0.7 K/uL   Basophils Relative 1 %   Basophils Absolute 0.0 0 - 0.1 K/uL    Comment: Performed at [facility], [address] [place], [place] [address]  TSH     Status: None   Collection Time: 12/28/17  9:08 PM  Result Value Ref Range   TSH 0.954 0.350 - 4.500 uIU/mL    Comment: Performed by a 3rd Generation assay with a functional sensitivity of <=0.01 uIU/mL. Performed at [facility], [address] [place], [place] [address]   Urinalysis, Complete w Microscopic     Status: Abnormal   Collection Time: 12/29/17 12:45 AM  Result Value Ref Range   Color, Urine YELLOW (A) YELLOW   APPearance HAZY (A) CLEAR   Specific Gravity, Urine 1.025 1.005 - 1.030   pH 5.0 5.0 - 8.0   Glucose, UA NEGATIVE NEGATIVE mg/dL   Hgb urine dipstick LARGE (A) NEGATIVE   Bilirubin Urine NEGATIVE NEGATIVE   Ketones, ur 20 (A) NEGATIVE mg/dL   Protein, ur 30 (A) NEGATIVE mg/dL   Nitrite NEGATIVE NEGATIVE   Leukocytes, UA NEGATIVE NEGATIVE   RBC / HPF 6-30 0 - 5 RBC/hpf   WBC, UA 6-30 0 - 5 WBC/hpf   Bacteria, UA NONE SEEN NONE SEEN   Squamous Epithelial / LPF NONE SEEN NONE SEEN   Ca Oxalate Crys, UA PRESENT  Comment: Performed at [facility], [address] [place],  [place] [address]   Ct Abdomen Pelvis W Contrast  Result Date: 12/28/2017 CLINICAL DATA:  Patient presents to Emergency Department via EMS [facility] with complaints of abdominal pain with Nausea and Vomiting x 2. 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 EXAM: CT ABDOMEN AND PELVIS WITH CONTRAST TECHNIQUE: Multidetector CT imaging of the abdomen and pelvis was performed using the standard protocol following bolus administration of intravenous contrast. CONTRAST:  82m ISOVUE-300 IOPAMIDOL (ISOVUE-300) INJECTION 61% COMPARISON:  11/13/2017 FINDINGS: Lower chest: Small pleural effusions. There is associated dependent lower lobe atelectasis. There are several lung base nodules larger than on the prior CT. A pleural-based left lateral lower lobe nodule, image 10, series 4, currently measures 8 mm in long axis, previously 5 mm. Hepatobiliary: There are numerous liver masses consistent with widespread metastatic disease. This has significantly worsened when compared to the prior CT. Several reference measurements were made. There is a lesion in the lateral segment of the left lobe, image 13, series 2, measuring 19 mm, previously 13 mm. Lesion in the right lobe, image 24, measures 3 cm, previously 2 cm. Image more inferiorly in the right lobe also measures 3 cm, previously 2 cm. Are multiple small new masses. Multiple gallstones. No evidence of acute cholecystitis. No bile duct dilation. Pancreas: No pancreatic mass or inflammation. Spleen: Normal in size without focal abnormality. Adrenals/Urinary Tract: 16 mm left adrenal mass is stable from the prior study. Normal right adrenal gland. Moderate left hydronephrosis. This is despite the presence of a left ureteral stent, which is well positioned. Left kidney shows decreased enhancement compared to the right and no contrast in the collecting system on the delayed sequence. No right hydronephrosis. No renal masses. Right ureters  normal in course and in caliber. Bladder is unremarkable. Stomach/Bowel: There is a small bowel anastomosis staple line in the left pelvis, unchanged. Stomach is moderately distended. No wall thickening or inflammation. There is mild dilation of the small bowel with multiple air-fluid levels, maximum diameter 3.2 cm. The terminal ileum is decompressed. Apparent obstruction point is in the left pelvis adjacent to the sigmoid colon. There is an area of relative narrowing of the sigmoid colon in the same location. Colon proximal to this is mildly distended and shows increased stool. Vascular/Lymphatic: Several prominent lymph nodes along the gastrohepatic ligament, none pathologically enlarged by size criteria. No discrete enlarged lymph nodes. Reproductive: Status post hysterectomy. Other: There is abnormal soft tissue that extends along the left retroperitoneum between the kidney and aorta, abutting the left psoas muscle and surrounding the left ureter. This extends into the left pelvis. No ascites. Musculoskeletal: No fracture or acute finding. No osteoblastic or osteolytic lesions. IMPRESSION: 1. Worsening locally invasive and metastatic carcinoma. There is increased abnormal soft tissue along the left retroperitoneum consistent with local spread of carcinoma. This surrounds the left ureter. It also abuts the mid sigmoid colon, which appears narrowed. Lung base nodules have increased in size. There are enlarged and and new liver metastatic lesions. 2. There is a partial small bowel obstruction. The transition point appears to be in the left pelvis, likely also due to the local spread of retroperitoneal carcinoma. The narrowed area of the sigmoid colon appears to cause a low grade partial colonic obstruction, with increased stool noted throughout the colon above the area of narrowing. 3. Left hydronephrosis, with decreased enhancement and excretion of the left kidney, despite the  presence of a well-positioned left  ureteral stent. Stent is new since the prior CT. 4. New bilateral pleural effusions. Electronically Signed   By: Lajean Manes M.D.   On: 12/28/2017 23:55    Assessment:  The patient is a 84 y.o. woman with recurrent stage IV peritoneal carcinomatosis who was admitted with intractable abdominal pain and a partial small bowel obstruction.  Abdomen and pelvic CT on 12/28/2017 revealed progressive disease. There was increased abnormal soft tissue along the left retroperitoneum consistent with local spread of carcinoma. Mass surrounds the left ureter and abuts the mid sigmoid colon, which appears narrowed. Lung base nodules have increased in size. There were enlarged and new liver lesions.  There was a partial small bowel obstruction. The transition point appeared to be in the left pelvis. The narrowed area of the sigmoid colon appeared to cause a low grade partial colonic obstruction, with increased stool noted throughout the colon above the area of narrowing.  There was left hydronephrosis, with decreased enhancement and excretion of the left kidney, despite the presence of a well-positioned left ureteral stent.   There are new bilateral pleural effusions.  Symptomatically, her pain is controlled.  Plan:   1. Oncology:  Discussed with the patient and her husband results from her current scan.  We discussed her treatment to date and her current goals of therapy.  She wishes to have her abdominal pain controlled.  She is interested in Hospice services at home.  We discussed a palliative care consult.  Dr Grayland Ormond to discuss further tomorrow.  2.  Hematology:  Counts adequate.  Lovenox for DVT prophylaxis.  3.  Gastroenterology:  Partial small bowel obstruction and low grade partial colonic obstruction from peritoneal metastasis.  Patient managed conservatively.  NPO and IVF.  Patient declined NG tube.  She has had flatus, but no bowel movement.  She is not a surgical candidate.   4.  Pain and Toxicology:   Pain well controlled with oxycodone 10 mg po q 6 hrs pn and morphine 2 mg IV q 3 hrs prn.  5.  Disposition:  Code status is DNR.  Thank you for allowing me to participate in Lisa Hayden 's care.  I will follow her closely with you while hospitalized until Dr. Gary Fleet return tomorrow.   Lequita Asal, MD  12/29/2017, 10:10 AM

## 2017-12-29 NOTE — H&P (Addendum)
Lisa Hayden is an 84 y.o. female.    Chief Complaint: Abdominal pain HPI: The patient with past medical history of peritoneal carcinomatosis with metastatic lesions to her lungs and liver presents to the emergency department complaining of lower abdominal pain times 2 days.  She reports one episode of nonbloody emesis.  She states that she is unable to have a bowel movement and has not had one in months.  She disimpacted herself in order to defecate.  CT of her abdomen shows worsening invasive disease and partial small bowel obstruction.  Surgical service was consulted who recommended that the patient was not a surgical candidate.  She also declines NG tube placement at this time.  Due to ongoing pain and consideration for palliative care the emergency department staff called the hospitalist service for admission.  Past Medical History:  Diagnosis Date  . Cancer [facility])    peritoneal carcinoma, breast cancer, liver mets, colon, ovaries  . Hypertension   . Osteoporosis     Past Surgical History:  Procedure Laterality Date  . 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 APPENDECTOMY  1953  . BREAST LUMPECTOMY Left 10/29/94   negative nodes. Treated with radiation only  . BREAST SURGERY    . BUNIONECTOMY  1979  . CATARACT EXTRACTION, BILATERAL     left 07/29/2012; right 06/24/2012  . COLON SURGERY  05/1991   secondary to cancer   . CYSTOSCOPY WITH STENT PLACEMENT Left 11/14/2017   Procedure: CYSTOSCOPY WITH STENT PLACEMENT;  Surgeon: Abbie Sons, MD;  Location: [facility] ORS;  Service: Urology;  Laterality: Left;  . ROTATOR CUFF REPAIR Right 01/25/10  . TONSILLECTOMY  1938   and adenoidectomy    Family History  Problem Relation Age of Onset  . CVA Mother   . Congestive Heart Failure Mother   . Diabetes Sister   . Asthma Father    Social History:  reports that she has quit smoking. Her smoking use included cigarettes. she has never used  smokeless tobacco. She reports that she drinks about 4.2 oz of alcohol per week. She reports that she does not use drugs.  Allergies:  Allergies  Allergen Reactions  . Actonel [Risedronate Sodium] Other (See Comments)    headache  . Cortisone Other (See Comments)    very alert and did not sleep for 24 hrs  . Daypro [Oxaprozin] Other (See Comments)    Reaction: unknown  . Meloxicam Other (See Comments)    Reaction: unknown  . Premarin [Conjugated Estrogens] Other (See Comments)    bleeding  . Prempro [Conj Estrog-Medroxyprogest Ace] Other (See Comments)    Reaction: unknown    Medications Prior to Admission  Medication Sig Dispense Refill  . acetaminophen (TYLENOL) 500 MG tablet Take 500 mg by mouth every 6 (six) hours as needed for mild pain or fever.     . Oxycodone HCl 10 MG TABS Take 1 tablet (10 mg total) by mouth every 6 (six) hours as needed (pain). 120 tablet 0    Results for orders placed or performed during the hospital encounter of 12/28/17 (from the past 48 hour(s))  Lipase, blood     Status: None   Collection Time: 12/28/17  9:08 PM  Result Value Ref Range   Lipase 18 11 - 51 U/L    Comment: Performed at [facility], [address] [place], [place] [address]  Comprehensive metabolic panel     Status: Abnormal  Collection Time: 12/28/17  9:08 PM  Result Value Ref Range   Sodium 135 135 - 145 mmol/L   Potassium 3.8 3.5 - 5.1 mmol/L   Chloride 97 (L) 101 - 111 mmol/L   CO2 24 22 - 32 mmol/L   Glucose, Bld 128 (H) 65 - 99 mg/dL   BUN 25 (H) 6 - 20 mg/dL   Creatinine, Ser 0.98 0.44 - 1.00 mg/dL   Calcium 8.8 (L) 8.9 - 10.3 mg/dL   Total Protein 6.6 6.5 - 8.1 g/dL   Albumin 3.5 3.5 - 5.0 g/dL   AST 31 15 - 41 U/L   ALT 24 14 - 54 U/L   Alkaline Phosphatase 75 38 - 126 U/L   Total Bilirubin 1.8 (H) 0.3 - 1.2 mg/dL   GFR calc non Af Amer 51 (L) >60 mL/min   GFR calc Af Amer 59 (L) >60 mL/min    Comment: (NOTE) The eGFR has been calculated using the  CKD EPI equation. This calculation has not been validated in all clinical situations. eGFR's persistently <60 mL/min signify possible Chronic Kidney Disease.    Anion gap 14 5 - 15    Comment: Performed at [facility], [address] [place], [place] [address]  CBC with Differential/Platelet     Status: Abnormal   Collection Time: 12/28/17  9:08 PM  Result Value Ref Range   WBC 5.3 3.6 - 11.0 K/uL   RBC 3.92 3.80 - 5.20 MIL/uL   Hemoglobin 11.6 (L) 12.0 - 16.0 g/dL   HCT 34.7 (L) 35.0 - 47.0 %   MCV 88.5 80.0 - 100.0 fL   MCH 29.7 26.0 - 34.0 pg   MCHC 33.6 32.0 - 36.0 g/dL   RDW 14.3 11.5 - 14.5 %   Platelets 284 150 - 440 K/uL   Neutrophils Relative % 91 %   Neutro Abs 4.8 1.4 - 6.5 K/uL   Lymphocytes Relative 1 %   Lymphs Abs 0.1 (L) 1.0 - 3.6 K/uL   Monocytes Relative 7 %   Monocytes Absolute 0.4 0.2 - 0.9 K/uL   Eosinophils Relative 0 %   Eosinophils Absolute 0.0 0 - 0.7 K/uL   Basophils Relative 1 %   Basophils Absolute 0.0 0 - 0.1 K/uL    Comment: Performed at [facility], [address] [place], [place] [address]  TSH     Status: None   Collection Time: 12/28/17  9:08 PM  Result Value Ref Range   TSH 0.954 0.350 - 4.500 uIU/mL    Comment: Performed by a 3rd Generation assay with a functional sensitivity of <=0.01 uIU/mL. Performed at [facility], [address] [place], [place] [address]   Urinalysis, Complete w Microscopic     Status: Abnormal   Collection Time: 12/29/17 12:45 AM  Result Value Ref Range   Color, Urine YELLOW (A) YELLOW   APPearance HAZY (A) CLEAR   Specific Gravity, Urine 1.025 1.005 - 1.030   pH 5.0 5.0 - 8.0   Glucose, UA NEGATIVE NEGATIVE mg/dL   Hgb urine dipstick LARGE (A) NEGATIVE   Bilirubin Urine NEGATIVE NEGATIVE   Ketones, ur 20 (A) NEGATIVE mg/dL   Protein, ur 30 (A) NEGATIVE mg/dL   Nitrite NEGATIVE NEGATIVE   Leukocytes, UA NEGATIVE NEGATIVE   RBC / HPF 6-30 0 - 5 RBC/hpf   WBC, UA 6-30 0 -  5 WBC/hpf   Bacteria, UA NONE SEEN NONE SEEN   Squamous Epithelial / LPF NONE SEEN NONE  SEEN   Ca Oxalate Crys, UA PRESENT     Comment: Performed at [facility], [address] [place], [place] [address]   Ct Abdomen Pelvis W Contrast  Result Date: 12/28/2017 CLINICAL DATA:  Patient presents to Emergency Department via EMS from [facility] (independent portion) with complaints of abdominal pain with Nausea and Vomiting x 2. 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 EXAM: CT ABDOMEN AND PELVIS WITH CONTRAST TECHNIQUE: Multidetector CT imaging of the abdomen and pelvis was performed using the standard protocol following bolus administration of intravenous contrast. CONTRAST:  74m ISOVUE-300 IOPAMIDOL (ISOVUE-300) INJECTION 61% COMPARISON:  11/13/2017 FINDINGS: Lower chest: Small pleural effusions. There is associated dependent lower lobe atelectasis. There are several lung base nodules larger than on the prior CT. A pleural-based left lateral lower lobe nodule, image 10, series 4, currently measures 8 mm in long axis, previously 5 mm. Hepatobiliary: There are numerous liver masses consistent with widespread metastatic disease. This has significantly worsened when compared to the prior CT. Several reference measurements were made. There is a lesion in the lateral segment of the left lobe, image 13, series 2, measuring 19 mm, previously 13 mm. Lesion in the right lobe, image 24, measures 3 cm, previously 2 cm. Image more inferiorly in the right lobe also measures 3 cm, previously 2 cm. Are multiple small new masses. Multiple gallstones. No evidence of acute cholecystitis. No bile duct dilation. Pancreas: No pancreatic mass or inflammation. Spleen: Normal in size without focal abnormality. Adrenals/Urinary Tract: 16 mm left adrenal mass is stable from the prior study. Normal right adrenal gland. Moderate left hydronephrosis. This is despite the presence of a left ureteral stent,  which is well positioned. Left kidney shows decreased enhancement compared to the right and no contrast in the collecting system on the delayed sequence. No right hydronephrosis. No renal masses. Right ureters normal in course and in caliber. Bladder is unremarkable. Stomach/Bowel: There is a small bowel anastomosis staple line in the left pelvis, unchanged. Stomach is moderately distended. No wall thickening or inflammation. There is mild dilation of the small bowel with multiple air-fluid levels, maximum diameter 3.2 cm. The terminal ileum is decompressed. Apparent obstruction point is in the left pelvis adjacent to the sigmoid colon. There is an area of relative narrowing of the sigmoid colon in the same location. Colon proximal to this is mildly distended and shows increased stool. Vascular/Lymphatic: Several prominent lymph nodes along the gastrohepatic ligament, none pathologically enlarged by size criteria. No discrete enlarged lymph nodes. Reproductive: Status post hysterectomy. Other: There is abnormal soft tissue that extends along the left retroperitoneum between the kidney and aorta, abutting the left psoas muscle and surrounding the left ureter. This extends into the left pelvis. No ascites. Musculoskeletal: No fracture or acute finding. No osteoblastic or osteolytic lesions. IMPRESSION: 1. Worsening locally invasive and metastatic carcinoma. There is increased abnormal soft tissue along the left retroperitoneum consistent with local spread of carcinoma. This surrounds the left ureter. It also abuts the mid sigmoid colon, which appears narrowed. Lung base nodules have increased in size. There are enlarged and and new liver metastatic lesions. 2. There is a partial small bowel obstruction. The transition point appears to be in the left pelvis, likely also due to the local spread of retroperitoneal carcinoma. The narrowed area of the sigmoid colon appears to cause a low grade partial colonic obstruction,  with increased stool noted throughout the colon above the area of narrowing. 3. Left  hydronephrosis, with decreased enhancement and excretion of the left kidney, despite the presence of a well-positioned left ureteral stent. Stent is new since the prior CT. 4. New bilateral pleural effusions. Electronically Signed   By: Lajean Manes M.D.   On: 12/28/2017 23:55    Review of Systems  Constitutional: Negative for chills and fever.  HENT: Negative for sore throat and tinnitus.   Eyes: Negative for blurred vision and redness.  Respiratory: Negative for cough and shortness of breath.   Cardiovascular: Negative for chest pain, palpitations, orthopnea and PND.  Gastrointestinal: Positive for abdominal pain. Negative for diarrhea, nausea and vomiting.  Genitourinary: Negative for dysuria, frequency and urgency.  Musculoskeletal: Negative for joint pain and myalgias.  Skin: Negative for rash.       No lesions  Neurological: Negative for speech change, focal weakness and weakness.  Endo/Heme/Allergies: Does not bruise/bleed easily.       No temperature intolerance  Psychiatric/Behavioral: Negative for depression and suicidal ideas.    Blood pressure 129/81, pulse 98, temperature 97.8 F (36.6 C), temperature source Oral, resp. rate 16, height 5' 2"  (1.575 m), weight 51.8 kg (114 lb 2 oz), SpO2 99 %. Physical Exam  Vitals reviewed. Constitutional: She is oriented to person, place, and time. She appears well-developed and well-nourished. No distress.  HENT:  Head: Normocephalic and atraumatic.  Mouth/Throat: Oropharynx is clear and moist.  Eyes: Conjunctivae and EOM are normal. Pupils are equal, round, and reactive to light. No scleral icterus.  Neck: Normal range of motion. Neck supple. No JVD present. No tracheal deviation present. No thyromegaly present.  Cardiovascular: Normal rate, regular rhythm and normal heart sounds. Exam reveals no gallop and no friction rub.  No murmur  heard. Respiratory: Effort normal and breath sounds normal.  GI: Soft. Bowel sounds are normal. She exhibits no distension. There is no tenderness.  Genitourinary:  Genitourinary Comments: Deferred  Musculoskeletal: Normal range of motion. She exhibits no edema.  Lymphadenopathy:    She has no cervical adenopathy.  Neurological: She is alert and oriented to person, place, and time. No cranial nerve deficit. She exhibits normal muscle tone.  Skin: Skin is warm and dry. No rash noted. No erythema.  Psychiatric: She has a normal mood and affect. Her behavior is normal. Judgment and thought content normal.     Assessment/Plan This is a 84 year old female admitted for small bowel obstruction. 1.  SBO: Patient is n.p.o.  Manage pain with IV narcotics if necessary. Also supportive care for nausea.  Consider palliative chemotherapy as well as hospice care. 2.  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  Consult oncology. 3.  DVT prophylaxis: Lovenox 4.  GI prophylaxis: None The patient is a DNR.  Time spent on admission orders and patient care approximately 45 minutes  Harrie Foreman, MD 12/29/2017, 5:00 AM

## 2017-12-29 NOTE — Plan of Care (Signed)
  Progressing Education: Knowledge of General Education information will improve 12/29/2017 1447 - Progressing by Cherylann Parr, RN Health Behavior/Discharge Planning: Ability to manage health-related needs will improve 12/29/2017 1447 - Progressing by Cherylann Parr, RN Clinical Measurements: Ability to maintain clinical measurements within normal limits will improve 12/29/2017 1447 - Progressing by Cherylann Parr, RN Will remain free from infection 12/29/2017 1447 - Progressing by Cherylann Parr, RN Diagnostic test results will improve 12/29/2017 1447 - Progressing by Cherylann Parr, RN Respiratory complications will improve 12/29/2017 1447 - Progressing by Cherylann Parr, RN Cardiovascular complication will be avoided 12/29/2017 1447 - Progressing by Cherylann Parr, RN Activity: Risk for activity intolerance will decrease 12/29/2017 1447 - Progressing by Cherylann Parr, RN Nutrition: Adequate nutrition will be maintained 12/29/2017 1447 - Progressing by Cherylann Parr, RN Coping: Level of anxiety will decrease 12/29/2017 1447 - Progressing by Cherylann Parr, RN Elimination: Will not experience complications related to bowel motility 12/29/2017 1447 - Progressing by Cherylann Parr, RN Will not experience complications related to urinary retention 12/29/2017 1447 - Progressing by Cherylann Parr, RN Pain Managment: General experience of comfort will improve 12/29/2017 1447 - Progressing by Cherylann Parr, RN Safety: Ability to remain free from injury will improve 12/29/2017 1447 - Progressing by Cherylann Parr, RN Skin Integrity: Risk for impaired skin integrity will decrease 12/29/2017 1447 - Progressing by Cherylann Parr, RN

## 2017-12-30 ENCOUNTER — Ambulatory Visit: Payer: Medicare Other | Admitting: Urology

## 2017-12-30 DIAGNOSIS — G893 Neoplasm related pain (acute) (chronic): Secondary | ICD-10-CM

## 2017-12-30 DIAGNOSIS — C786 Secondary malignant neoplasm of retroperitoneum and peritoneum: Secondary | ICD-10-CM

## 2017-12-30 DIAGNOSIS — K56609 Unspecified intestinal obstruction, unspecified as to partial versus complete obstruction: Secondary | ICD-10-CM

## 2017-12-30 DIAGNOSIS — R109 Unspecified abdominal pain: Secondary | ICD-10-CM

## 2017-12-30 DIAGNOSIS — C787 Secondary malignant neoplasm of liver and intrahepatic bile duct: Secondary | ICD-10-CM

## 2017-12-30 DIAGNOSIS — Z515 Encounter for palliative care: Secondary | ICD-10-CM

## 2017-12-30 DIAGNOSIS — C569 Malignant neoplasm of unspecified ovary: Secondary | ICD-10-CM

## 2017-12-30 DIAGNOSIS — K56600 Partial intestinal obstruction, unspecified as to cause: Principal | ICD-10-CM

## 2017-12-30 DIAGNOSIS — Z66 Do not resuscitate: Secondary | ICD-10-CM

## 2017-12-30 LAB — BASIC METABOLIC PANEL
Anion gap: 13 (ref 5–15)
BUN: 21 mg/dL — AB (ref 6–20)
CALCIUM: 7.9 mg/dL — AB (ref 8.9–10.3)
CO2: 21 mmol/L — AB (ref 22–32)
CREATININE: 0.92 mg/dL (ref 0.44–1.00)
Chloride: 101 mmol/L (ref 101–111)
GFR calc Af Amer: >60 mL/min (ref >60)
GFR, EST NON AFRICAN AMERICAN: 55 mL/min — AB (ref >60)
GLUCOSE: 81 mg/dL (ref 65–99)
Potassium: 3.6 mmol/L (ref 3.5–5.1)
Sodium: 135 mmol/L (ref 135–145)

## 2017-12-30 LAB — CBC
HEMATOCRIT: 31 % — AB (ref 35.0–47.0)
Hemoglobin: 10.2 g/dL — ABNORMAL LOW (ref 12.0–16.0)
MCH: 29.3 pg (ref 26.0–34.0)
MCHC: 33 g/dL (ref 32.0–36.0)
MCV: 88.8 fL (ref 80.0–100.0)
PLATELETS: 267 10*3/uL (ref 150–440)
RBC: 3.49 MIL/uL — ABNORMAL LOW (ref 3.80–5.20)
RDW: 14.3 % (ref 11.5–14.5)
WBC: 1.3 10*3/uL — AB (ref 3.6–11.0)

## 2017-12-30 MED ORDER — OXYCODONE HCL ER 10 MG PO T12A: 10.0000 mg | EXTENDED_RELEASE_TABLET | Freq: Two times a day (BID) | ORAL | Status: DC

## 2017-12-30 MED ORDER — ORAL CARE MOUTH RINSE
15.0000 mL | Freq: Two times a day (BID) | OROMUCOSAL | Status: DC
  Administered 2017-12-31: 15 mL via OROMUCOSAL

## 2017-12-30 MED ORDER — MORPHINE SULFATE ER 15 MG PO TBCR
15.0000 mg | EXTENDED_RELEASE_TABLET | Freq: Two times a day (BID) | ORAL | Status: DC
  Administered 2017-12-30 – 2017-12-31 (×3): 15 mg via ORAL
  Filled 2017-12-30 (×3): qty 1

## 2017-12-30 MED ORDER — BISACODYL 10 MG RE SUPP
10.0000 mg | Freq: Every day | RECTAL | Status: DC | PRN
  Administered 2017-12-30: 10 mg via RECTAL
  Filled 2017-12-30: qty 1

## 2017-12-30 MED ORDER — MORPHINE SULFATE (CONCENTRATE) 10 MG/0.5ML PO SOLN
10.0000 mg | ORAL | Status: DC | PRN
  Administered 2017-12-31 (×2): 10 mg via ORAL
  Filled 2017-12-30 (×2): qty 1

## 2017-12-30 MED ORDER — CHLORHEXIDINE GLUCONATE 0.12 % MT SOLN
15.0000 mL | Freq: Two times a day (BID) | OROMUCOSAL | Status: DC
  Administered 2017-12-30: 15 mL via OROMUCOSAL
  Filled 2017-12-30 (×2): qty 15

## 2017-12-30 MED ORDER — OXYCODONE HCL 5 MG PO TABS: 5.0000 mg | ORAL_TABLET | ORAL | Status: DC | PRN

## 2017-12-30 NOTE — Plan of Care (Addendum)
Morphine given x2 for abdominal pain with improvement.  Pt wants to go home with hospice.  MS contin initiated for pain. Pt had very small BM in am per pt. Dulcolax supp given per pt request. Poor PO intake.

## 2017-12-30 NOTE — Progress Notes (Signed)
[place] [facility]                                                                                                                                                                                  Patient Demographics   Lisa Hayden, is a 84 y.o. female, DOB - 1930/08/27, [name] date - 12/28/2017   Admitting Physician Harrie Foreman, MD  Outpatient Primary MD for the patient is Birdie Sons, MD   LOS - 1  Subjective: Patient admitted with small bowel obstruction states that abdominal pain now resolved   constipated reports passing some liquidy stool sometimes Husband at bedside, patient wants to talk to Dr. Grayland Ormond and leaning towards hospice care   Review of Systems:   CONSTITUTIONAL: No documented fever. No fatigue, weakness. No weight gain, no weight loss.  EYES: No blurry or double vision.  ENT: No tinnitus. No postnasal drip. No redness of the oropharynx.  RESPIRATORY: No cough, no wheeze, no hemoptysis. No dyspnea.  CARDIOVASCULAR: No chest pain. No orthopnea. No palpitations. No syncope.  GASTROINTESTINAL: No nausea, no vomiting or diarrhea.  No abdominal pain.  Reports constipation GENITOURINARY: No dysuria or hematuria.  ENDOCRINE: No polyuria or nocturia. No heat or cold intolerance.  HEMATOLOGY: No anemia. No bruising. No bleeding.  INTEGUMENTARY: No rashes. No lesions.  MUSCULOSKELETAL: No arthritis. No swelling. No gout.  NEUROLOGIC: No numbness, tingling, or ataxia. No seizure-type activity.  PSYCHIATRIC: No anxiety. No insomnia. No ADD.    Vitals:   Vitals:   12/29/17 2158 12/30/17 0530 12/30/17 1350 12/30/17 2026  BP: 138/70 126/69 (!) 146/77 (!) 145/70  Pulse: 91 93 90 89  Resp: 18 19 18 18   Temp:  98.3 F (36.8 C) 97.9 F (36.6 C) 98.2 F (36.8 C)  TempSrc:  Oral Oral Oral  SpO2: 96% 97% 97% 99%  Weight:      Height:        Wt Readings from Last 3 Encounters:  12/29/17 51.8 kg (114 lb 2 oz)   12/13/17 51.3 kg (113 lb 3.2 oz)  11/21/17 54 kg (119 lb 1.6 oz)     Intake/Output Summary (Last 24 hours) at 12/30/2017 2028 Last data filed at 12/30/2017 0530 Gross per 24 hour  Intake 2313.33 ml  Output 0 ml  Net 2313.33 ml    Physical Exam:   GENERAL: Pleasant-appearing in no apparent distress.  HEAD, EYES, EARS, NOSE AND THROAT: Atraumatic, normocephalic. Extraocular muscles are intact. Pupils equal and reactive to light. Sclerae anicteric. No conjunctival injection. No oro-pharyngeal erythema.  NECK: Supple. There is no jugular venous distention. No bruits, no lymphadenopathy, no thyromegaly.  HEART:  Regular rate and rhythm,. No murmurs, no rubs, no clicks.  LUNGS: Clear to auscultation bilaterally. No rales or rhonchi. No wheezes.  ABDOMEN: Soft, decreased bowel sounds distended no guarding no rebound. Has good bowel sounds. No hepatosplenomegaly appreciated.  EXTREMITIES: No evidence of any cyanosis, clubbing, or peripheral edema.  +2 pedal and radial pulses bilaterally.  NEUROLOGIC: The patient is alert, awake, and oriented x3 with no focal motor or sensory deficits appreciated bilaterally.  SKIN: Moist and warm with no rashes appreciated.  Psych: Not anxious, depressed LN: No inguinal LN enlargement    Antibiotics   Anti-infectives (From admission, onward)   None      Medications   Scheduled Meds: . chlorhexidine  15 mL Mouth Rinse BID  . docusate sodium  100 mg Oral BID  . mouth rinse  15 mL Mouth Rinse q12n4p  . morphine  15 mg Oral Q12H   Continuous Infusions:  PRN Meds:.acetaminophen **OR** acetaminophen, bisacodyl, morphine CONCENTRATE, ondansetron **OR** ondansetron (ZOFRAN) IV, sodium phosphate   Data Review:   Micro Results No results found for this or any previous visit (from the past 240 hour(s)).  Radiology Reports Ct Abdomen Pelvis W Contrast  Result Date: 12/28/2017 CLINICAL DATA:  Patient presents to Emergency Department via EMS from [facility] (independent portion) with complaints of abdominal pain with Nausea and Vomiting x 2. 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 EXAM: CT ABDOMEN AND PELVIS WITH CONTRAST TECHNIQUE: Multidetector CT imaging of the abdomen and pelvis was performed using the standard protocol following bolus administration of intravenous contrast. CONTRAST:  49mL ISOVUE-300 IOPAMIDOL (ISOVUE-300) INJECTION 61% COMPARISON:  11/13/2017 FINDINGS: Lower chest: Small pleural effusions. There is associated dependent lower lobe atelectasis. There are several lung base nodules larger than on the prior CT. A pleural-based left lateral lower lobe nodule, image 10, series 4, currently measures 8 mm in long axis, previously 5 mm. Hepatobiliary: There are numerous liver masses consistent with widespread metastatic disease. This has significantly worsened when compared to the prior CT. Several reference measurements were made. There is a lesion in the lateral segment of the left lobe, image 13, series 2, measuring 19 mm, previously 13 mm. Lesion in the right lobe, image 24, measures 3 cm, previously 2 cm. Image more inferiorly in the right lobe also measures 3 cm, previously 2 cm. Are multiple small new masses. Multiple gallstones. No evidence of acute cholecystitis. No bile duct dilation. Pancreas: No pancreatic mass or inflammation. Spleen: Normal in size without focal abnormality. Adrenals/Urinary Tract: 16 mm left adrenal mass is stable from the prior study. Normal right adrenal gland. Moderate left hydronephrosis. This is despite the presence of a left ureteral stent, which is well positioned. Left kidney shows decreased enhancement compared to the right and no contrast in the collecting system on the delayed sequence. No right hydronephrosis. No renal masses. Right ureters normal in course and in caliber. Bladder is unremarkable. Stomach/Bowel: There is a small bowel anastomosis staple line in the left pelvis,  unchanged. Stomach is moderately distended. No wall thickening or inflammation. There is mild dilation of the small bowel with multiple air-fluid levels, maximum diameter 3.2 cm. The terminal ileum is decompressed. Apparent obstruction point is in the left pelvis adjacent to the sigmoid colon. There is an area of relative narrowing of the sigmoid colon in the same location. Colon proximal to this is mildly distended and shows increased stool. Vascular/Lymphatic: Several prominent lymph nodes along the gastrohepatic ligament, none pathologically enlarged  by size criteria. No discrete enlarged lymph nodes. Reproductive: Status post hysterectomy. Other: There is abnormal soft tissue that extends along the left retroperitoneum between the kidney and aorta, abutting the left psoas muscle and surrounding the left ureter. This extends into the left pelvis. No ascites. Musculoskeletal: No fracture or acute finding. No osteoblastic or osteolytic lesions. IMPRESSION: 1. Worsening locally invasive and metastatic carcinoma. There is increased abnormal soft tissue along the left retroperitoneum consistent with local spread of carcinoma. This surrounds the left ureter. It also abuts the mid sigmoid colon, which appears narrowed. Lung base nodules have increased in size. There are enlarged and and new liver metastatic lesions. 2. There is a partial small bowel obstruction. The transition point appears to be in the left pelvis, likely also due to the local spread of retroperitoneal carcinoma. The narrowed area of the sigmoid colon appears to cause a low grade partial colonic obstruction, with increased stool noted throughout the colon above the area of narrowing. 3. Left hydronephrosis, with decreased enhancement and excretion of the left kidney, despite the presence of a well-positioned left ureteral stent. Stent is new since the prior CT. 4. New bilateral pleural effusions. Electronically Signed   By: Lajean Manes M.D.   On:  12/28/2017 23:55     CBC Recent Labs  Lab 12/28/17 2108 12/30/17 0344  WBC 5.3 1.3*  HGB 11.6* 10.2*  HCT 34.7* 31.0*  PLT 284 267  MCV 88.5 88.8  MCH 29.7 29.3  MCHC 33.6 33.0  RDW 14.3 14.3  LYMPHSABS 0.1*  --   MONOABS 0.4  --   EOSABS 0.0  --   BASOSABS 0.0  --     Chemistries  Recent Labs  Lab 12/28/17 2108 12/30/17 0344  NA 135 135  K 3.8 3.6  CL 97* 101  CO2 24 21*  GLUCOSE 128* 81  BUN 25* 21*  CREATININE 0.98 0.92  CALCIUM 8.8* 7.9*  AST 31  --   ALT 24  --   ALKPHOS 75  --   BILITOT 1.8*  --    ------------------------------------------------------------------------------------------------------------------ estimated creatinine clearance is 35.4 mL/min (by C-G formula based on SCr of 0.92 mg/dL). ------------------------------------------------------------------------------------------------------------------ No results for input(s): HGBA1C in the last 72 hours. ------------------------------------------------------------------------------------------------------------------ No results for input(s): CHOL, HDL, LDLCALC, TRIG, CHOLHDL, LDLDIRECT in the last 72 hours. ------------------------------------------------------------------------------------------------------------------ Recent Labs    12/28/17 2108  TSH 0.954   ------------------------------------------------------------------------------------------------------------------ No results for input(s): VITAMINB12, FOLATE, FERRITIN, TIBC, IRON, RETICCTPCT in the last 72 hours.  Coagulation profile No results for input(s): INR, PROTIME in the last 168 hours.  No results for input(s): DDIMER in the last 72 hours.  Cardiac Enzymes No results for input(s): CKMB, TROPONINI, MYOGLOBIN in the last 168 hours.  Invalid input(s): CK ------------------------------------------------------------------------------------------------------------------ Invalid input(s): [place]    This is a 84 year old female admitted for small bowel obstruction. 1.  Partial SBO:  Colace if no response by later today can give 1 Fleet enema to see if that helps for symptomatic relief 2.  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  According to the CT scan of the abdomen is getting worse.  Patient has decided to pursue hospice care after discussing with oncologist Dr. Grayland Ormond.  Palliative care is involved and referral made to hospice care. pain management as needed 3.  Essential hypertension blood pressure currently stable:.   4.    Miscellaneous Lovenox for DVT prophylaxis  Disposition anticipate discharge tomorrow with hospice care at  home      Code Status Orders  (From admission, onward)        Start     Ordered   12/29/17 0335  Do not attempt resuscitation (DNR)  Continuous    Question Answer Comment  In the event of cardiac or respiratory ARREST Do not call a "code blue"   In the event of cardiac or respiratory ARREST Do not perform Intubation, CPR, defibrillation or ACLS   In the event of cardiac or respiratory ARREST Use medication by any route, position, wound care, and other measures to relive pain and suffering. May use oxygen, suction and manual treatment of airway obstruction as needed for comfort.      12/29/17 0334    Code Status History    Date Active Date Inactive Code Status Order ID Comments User Context   12/29/2017 02:59 12/29/2017 02:59 Full Code [id number]  Harrie Foreman, MD ED   11/14/2017 18:13 11/15/2017 15:49 Full Code [id number]  Ammie Dalton, RN Inpatient   11/14/2017 14:54 11/14/2017 18:13 DNR [id number]  Loletha Grayer, MD Inpatient   11/14/2017 04:59 11/14/2017 14:54 Full Code [id number]  Harrie Foreman, MD Inpatient   01/16/2017 01:54 01/22/2017 03:20 Full Code [id number]  Lance Coon, MD Inpatient    Advance Directive Documentation     Most Recent Value  Type of Advance Directive  Out of facility DNR (pink MOST  or yellow form)  Pre-existing out of facility DNR order (yellow form or pink MOST form)  Pink MOST form placed in chart (order not valid for inpatient use)  "MOST" Form in Place?  No data           Consults oncology  DVT Prophylaxis  Lovenox   Lab Results  Component Value Date   PLT 267 12/30/2017     Time Spent in minutes 41 minutes  Greater than 50% of time spent in care coordination and counseling patient regarding the condition and plan of care.   Nicholes Mango M.D on 12/30/2017 at 8:28 PM  Between 7am to 6pm - Pager - [phone]  After 6pm go to [email or web address] - password [email or web address] [facility] [place] [facility]   Office  [phone]

## 2017-12-30 NOTE — Progress Notes (Signed)
New referral for [facility] services at home received from [facility] following a Palliative Medicine consult. 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 CT revealed worsening invasive disease and partial small bowel obstruction. Palliative Medicine was consulted for goals of care and met with patient and her husband.they have chosen to return home with the support of hospice services. Writer met in the room with patient, her husband [name] and friend/neighbor Santiago Glad to initiate education regarding hospice services, philosophy and team approach to care with good understanding voiced. Patient does not need any DME at this time. She is most concerned with controlling her pain. She is currently receiving IV morphine. Writer discussed pain medication with Palliative NP Wadie Lessen, who made changes to current regime. Plan is for patient to discharge home tomorrow via car. Hospice information and contact numbers given to patient. Patient information faxed to referral. Hospital care team updated. Will follow through discharge.  Flo Shanks RN, BSN, [facility] [place], hospital Liaison [phone]

## 2017-12-30 NOTE — Plan of Care (Signed)
Poor nutrition, sbo. Clear liquid diet  Education: Knowledge of General Education information will improve 12/30/2017 0221 - Progressing by Jeffie Pollock, RN   Health Behavior/Discharge Planning: Ability to manage health-related needs will improve 12/30/2017 0221 - Progressing by Jeffie Pollock, RN   Clinical Measurements: Ability to maintain clinical measurements within normal limits will improve 12/30/2017 0221 - Progressing by Jeffie Pollock, RN Will remain free from infection 12/30/2017 0221 - Progressing by Jeffie Pollock, RN Diagnostic test results will improve 12/30/2017 0221 - Progressing by Jeffie Pollock, RN Respiratory complications will improve 12/30/2017 0221 - Progressing by Jeffie Pollock, RN Cardiovascular complication will be avoided 12/30/2017 0221 - Progressing by Jeffie Pollock, RN   Activity: Risk for activity intolerance will decrease 12/30/2017 0221 - Progressing by Jeffie Pollock, RN   Nutrition: Adequate nutrition will be maintained 12/30/2017 0221 - Not Progressing by Jeffie Pollock, RN   Coping: Level of anxiety will decrease 12/30/2017 0221 - Progressing by Jeffie Pollock, RN   Elimination: Will not experience complications related to bowel motility 12/30/2017 0221 - Progressing by Jeffie Pollock, RN Will not experience complications related to urinary retention 12/30/2017 0221 - Progressing by Jeffie Pollock, RN   Pain Managment: General experience of comfort will improve 12/30/2017 0221 - Progressing by Jeffie Pollock, RN   Safety: Ability to remain free from injury will improve 12/30/2017 0221 - Progressing by Jeffie Pollock, RN   Skin Integrity: Risk for impaired skin integrity will decrease 12/30/2017 0221 - Progressing by Jeffie Pollock, RN

## 2017-12-30 NOTE — Consult Note (Signed)
[place]  [phone] [phone]  ID: Lisa Hayden OB: Sep 05, 1930  MR#: [id number]  ION#:[id number]  Patient Care Team: Birdie Sons, MD as PCP - General (Family Medicine) Clent Jacks, RN as Registered Nurse Birder Robson, MD as Referring Physician (Ophthalmology) Lloyd Huger, MD as Consulting Physician (Oncology) Benjaman Kindler, MD as Consulting Physician (Obstetrics and Gynecology) Gillis Ends, MD as Referring Physician (Obstetrics and Gynecology) Oneta Rack, MD as Consulting Physician (Dermatology)  CHIEF COMPLAINT: Progressive ovarian cancer, abdominal pain, partial small bowel obstruction.  INTERVAL HISTORY: Patient is an 84 year old female with known progressive ovarian cancer who was recently admitted to the hospital with increasing abdominal pain.  CT scan revealed a partial small bowel obstruction likely secondary to progressive disease.  Currently, patient feels improved since admission.  She has no neurologic complaints.  She denies any recent fevers or illnesses.  She has a fair appetite, but denies weight loss.  She has no chest pain or shortness of breath.  She denies any current nausea, vomiting, constipation, or diarrhea.  She has no urinary complaints.  Patient offers no further specific complaints.  REVIEW OF SYSTEMS:   Review of Systems  Constitutional: Negative.  Negative for fever, malaise/fatigue and weight loss.  Respiratory: Negative.  Negative for cough, hemoptysis and shortness of breath.   Cardiovascular: Negative.  Negative for chest pain and leg swelling.  Gastrointestinal: Negative.  Negative for abdominal pain, blood in stool, constipation, diarrhea, melena, nausea and vomiting.  Musculoskeletal: Negative.   Skin: Negative.  Negative for rash.  Neurological: Negative.  Negative for weakness.  Psychiatric/Behavioral: Negative.  The patient is not nervous/anxious.      As per HPI. Otherwise, a complete review of systems is negative.  PAST MEDICAL HISTORY: Past Medical History:  Diagnosis Date  . Cancer [facility])    peritoneal carcinoma, breast cancer, liver mets, colon, ovaries  . Hypertension   . Osteoporosis     PAST SURGICAL HISTORY: Past Surgical History:  Procedure Laterality Date  . 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 APPENDECTOMY  1953  . BREAST LUMPECTOMY Left 10/29/94   negative nodes. Treated with radiation only  . BREAST SURGERY    . BUNIONECTOMY  1979  . CATARACT EXTRACTION, BILATERAL     left 07/29/2012; right 06/24/2012  . COLON SURGERY  05/1991   secondary to cancer   . CYSTOSCOPY WITH STENT PLACEMENT Left 11/14/2017   Procedure: CYSTOSCOPY WITH STENT PLACEMENT;  Surgeon: Abbie Sons, MD;  Location: [facility] ORS;  Service: Urology;  Laterality: Left;  . ROTATOR CUFF REPAIR Right 01/25/10  . TONSILLECTOMY  1938   and adenoidectomy    FAMILY HISTORY: Family History  Problem Relation Age of Onset  . CVA Mother   . Congestive Heart Failure Mother   . Diabetes Sister   . Asthma Father     ADVANCED DIRECTIVES (Y/N):  @ADVDIR @  HEALTH MAINTENANCE: Social History   Tobacco Use  . Smoking status: Former Smoker    Types: Cigarettes  . Smokeless tobacco: Never Used  Substance Use Topics  . Alcohol use: Yes    Alcohol/week: 4.2 oz    Types: 7 Standard drinks or equivalent per week    Comment: glass or beer at dinner  . Drug use: No     Colonoscopy:  PAP:  Bone density:  Lipid panel:  Allergies  Allergen Reactions  . Actonel [Risedronate Sodium] Other (See  Comments)    headache  . Cortisone Other (See Comments)    very alert and did not sleep for 24 hrs  . Daypro [Oxaprozin] Other (See Comments)    Reaction: unknown  . Meloxicam Other (See Comments)    Reaction: unknown  . Premarin [Conjugated Estrogens] Other (See Comments)    bleeding  . Prempro [Conj  Estrog-Medroxyprogest Ace] Other (See Comments)    Reaction: unknown    Current Facility-Administered Medications  Medication Dose Route Frequency Provider Last Rate Last Dose  . 0.9 %  sodium chloride infusion   Intravenous Continuous Harrie Foreman, MD 100 mL/hr at 12/30/17 0902    . acetaminophen (TYLENOL) tablet 650 mg  650 mg Oral Q6H PRN Harrie Foreman, MD       Or  . acetaminophen (TYLENOL) suppository 650 mg  650 mg Rectal Q6H PRN Harrie Foreman, MD      . bisacodyl (DULCOLAX) suppository 10 mg  10 mg Rectal Daily PRN Gouru, Aruna, MD      . docusate sodium (COLACE) capsule 100 mg  100 mg Oral BID Harrie Foreman, MD   100 mg at 12/30/17 1037  . enoxaparin (LOVENOX) injection 40 mg  40 mg Subcutaneous Q24H Harrie Foreman, MD      . morphine 2 MG/ML injection 2 mg  2 mg Intravenous Q3H PRN Harrie Foreman, MD   2 mg at 12/30/17 1037  . ondansetron (ZOFRAN) tablet 4 mg  4 mg Oral Q6H PRN Harrie Foreman, MD       Or  . ondansetron [facility]) injection 4 mg  4 mg Intravenous Q6H PRN Harrie Foreman, MD   4 mg at 12/29/17 0403  . oxyCODONE (Oxy IR/ROXICODONE) immediate release tablet 10 mg  10 mg Oral Q6H PRN Harrie Foreman, MD      . sodium phosphate (FLEET) 7-19 GM/118ML enema 1 enema  1 enema Rectal Daily PRN Dustin Flock, MD        OBJECTIVE: Vitals:   12/29/17 2158 12/30/17 0530  BP: 138/70 126/69  Pulse: 91 93  Resp: 18 19  Temp:  98.3 F (36.8 C)  SpO2: 96% 97%     Body mass index is 20.87 kg/m.    ECOG FS:1 - Symptomatic but completely ambulatory  General: Well-developed, well-nourished, no acute distress. Eyes: Pink conjunctiva, anicteric sclera. HEENT: Normocephalic, moist mucous membranes, clear oropharnyx. Lungs: Clear to auscultation bilaterally. Heart: Regular rate and rhythm. No rubs, murmurs, or gallops. Abdomen: Soft, nontender, nondistended. No organomegaly noted, normoactive bowel sounds. Musculoskeletal: No edema,  cyanosis, or clubbing. Neuro: Alert, answering all questions appropriately. Cranial nerves grossly intact. Skin: No rashes or petechiae noted. Psych: Normal affect. Lymphatics: No cervical, calvicular, axillary or inguinal LAD.   LAB RESULTS:  Lab Results  Component Value Date   NA 135 12/30/2017   K 3.6 12/30/2017   CL 101 12/30/2017   CO2 21 (L) 12/30/2017   GLUCOSE 81 12/30/2017   BUN 21 (H) 12/30/2017   CREATININE 0.92 12/30/2017   CALCIUM 7.9 (L) 12/30/2017   PROT 6.6 12/28/2017   ALBUMIN 3.5 12/28/2017   AST 31 12/28/2017   ALT 24 12/28/2017   ALKPHOS 75 12/28/2017   BILITOT 1.8 (H) 12/28/2017   GFRNONAA 55 (L) 12/30/2017   GFRAA >60 12/30/2017    Lab Results  Component Value Date   WBC 1.3 (LL) 12/30/2017   NEUTROABS 4.8 12/28/2017   HGB 10.2 (L) 12/30/2017   HCT 31.0 (L)  12/30/2017   MCV 88.8 12/30/2017   PLT 267 12/30/2017     STUDIES: Ct Abdomen Pelvis W Contrast  Result Date: 12/28/2017 CLINICAL DATA:  Patient presents to Emergency Department via EMS from [facility] (independent portion) with complaints of abdominal pain with Nausea and Vomiting x 2. 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 EXAM: CT ABDOMEN AND PELVIS WITH CONTRAST TECHNIQUE: Multidetector CT imaging of the abdomen and pelvis was performed using the standard protocol following bolus administration of intravenous contrast. CONTRAST:  32mL ISOVUE-300 IOPAMIDOL (ISOVUE-300) INJECTION 61% COMPARISON:  11/13/2017 FINDINGS: Lower chest: Small pleural effusions. There is associated dependent lower lobe atelectasis. There are several lung base nodules larger than on the prior CT. A pleural-based left lateral lower lobe nodule, image 10, series 4, currently measures 8 mm in long axis, previously 5 mm. Hepatobiliary: There are numerous liver masses consistent with widespread metastatic disease. This has significantly worsened when compared to the prior CT. Several reference measurements  were made. There is a lesion in the lateral segment of the left lobe, image 13, series 2, measuring 19 mm, previously 13 mm. Lesion in the right lobe, image 24, measures 3 cm, previously 2 cm. Image more inferiorly in the right lobe also measures 3 cm, previously 2 cm. Are multiple small new masses. Multiple gallstones. No evidence of acute cholecystitis. No bile duct dilation. Pancreas: No pancreatic mass or inflammation. Spleen: Normal in size without focal abnormality. Adrenals/Urinary Tract: 16 mm left adrenal mass is stable from the prior study. Normal right adrenal gland. Moderate left hydronephrosis. This is despite the presence of a left ureteral stent, which is well positioned. Left kidney shows decreased enhancement compared to the right and no contrast in the collecting system on the delayed sequence. No right hydronephrosis. No renal masses. Right ureters normal in course and in caliber. Bladder is unremarkable. Stomach/Bowel: There is a small bowel anastomosis staple line in the left pelvis, unchanged. Stomach is moderately distended. No wall thickening or inflammation. There is mild dilation of the small bowel with multiple air-fluid levels, maximum diameter 3.2 cm. The terminal ileum is decompressed. Apparent obstruction point is in the left pelvis adjacent to the sigmoid colon. There is an area of relative narrowing of the sigmoid colon in the same location. Colon proximal to this is mildly distended and shows increased stool. Vascular/Lymphatic: Several prominent lymph nodes along the gastrohepatic ligament, none pathologically enlarged by size criteria. No discrete enlarged lymph nodes. Reproductive: Status post hysterectomy. Other: There is abnormal soft tissue that extends along the left retroperitoneum between the kidney and aorta, abutting the left psoas muscle and surrounding the left ureter. This extends into the left pelvis. No ascites. Musculoskeletal: No fracture or acute finding. No  osteoblastic or osteolytic lesions. IMPRESSION: 1. Worsening locally invasive and metastatic carcinoma. There is increased abnormal soft tissue along the left retroperitoneum consistent with local spread of carcinoma. This surrounds the left ureter. It also abuts the mid sigmoid colon, which appears narrowed. Lung base nodules have increased in size. There are enlarged and and new liver metastatic lesions. 2. There is a partial small bowel obstruction. The transition point appears to be in the left pelvis, likely also due to the local spread of retroperitoneal carcinoma. The narrowed area of the sigmoid colon appears to cause a low grade partial colonic obstruction, with increased stool noted throughout the colon above the area of narrowing. 3. Left hydronephrosis, with decreased enhancement and excretion of the left kidney, despite  the presence of a well-positioned left ureteral stent. Stent is new since the prior CT. 4. New bilateral pleural effusions. Electronically Signed   By: Lajean Manes M.D.   On: 12/28/2017 23:55    ASSESSMENT: Progressive stage IV peritoneal carcinomatosis, abdominal pain, partial small bowel obstruction.  PLAN:    1.  Recurrent stage IV peritoneal carcinomatosis: Patient was last seen in clinic on November 21, 2017 at which point she declined any further treatments, but was not yet ready to enroll in hospice.  Previously, she completed 4 cycles of carboplatinum and Taxol on May 08, 2017. Restaging CT scan on June 13, 2017 reviewed independently with significant improvement of patient's disease burden, although she had a left inguinal lymph node that revealed persistent disease.  She then received 3 additional cycles of carboplatinum and Taxol completing cycle 7 on August 28, 2017. CT scan in the ER reviewed independently and report as above with worsening metastatic disease.  Patient continues to decline any further treatment and has now elected to enroll in hospice.  No further  interventions needed.  No follow-up in the cancer center is necessary.  Appreciate consult, call with questions.   Cancer Staging Peritoneal carcinomatosis [facility]) Staging form: Soft Tissue Sarcoma of the Abdomen and Thoracic Visceral Organs, AJCC 8th Edition - Clinical stage from 02/13/2017: Arnoldo Hooker, cM1 - Signed by Lloyd Huger, MD on 12/27/2017   Lloyd Huger, MD   12/30/2017 2:01 PM

## 2017-12-30 NOTE — Progress Notes (Signed)
Initial Nutrition Assessment  DOCUMENTATION CODES:   Severe malnutrition in context of chronic illness  INTERVENTION:  As patient has partial small bowel obstruction with distended abdomen and is only tolerating sips of clear liquids, no appropriate nutrition intervention at this time. Patient is not a surgical candidate and has refused NGT for decompression.  If SBO resolves with conservative management can consider oral nutrition supplement at that time.  Will monitor discussions regarding goals of care.  Updated HealthTouch to reflect that patient does not want broth on her trays per her request.  NUTRITION DIAGNOSIS:   Severe Malnutrition related to chronic illness(metastatic peritoneal carcinoma) as evidenced by severe fat depletion, moderate muscle depletion, severe muscle depletion.  GOAL:   Patient will meet greater than or equal to 90% of their needs  MONITOR:   Diet advancement, Labs, Weight trends, I & O's  REASON FOR ASSESSMENT:   Malnutrition Screening Tool    ASSESSMENT:   84 year old female with PMHx of HTN, OP, stage IV peritoneal carcinoma with metastases to liver and lung s/p 4 cycles of carboplatinum and Taxolwho has declined any further treatments now admitted with partial SBO from progressive disease. Patient is not a surgical candidate and declined NGT for decompression.   -Diet was advanced to clear liquids on 2/10.  Met with patient, her husband, and a friend at bedside. Patient reports she has decided to go home with hospice after this admission. She does not want any further treatment. She reports her body began "rejecting" food a few days before admission. She was having abdominal pain and N/V. Her husband reports she has not had a good meal in at least 3-4 weeks. She is not hungry at all anymore. She reports she is only tolerating very small sips of clear liquids. She does not want anymore broth because the warm liquid is not sitting well on her  stomach. Abdomen distended today.  UBW had been 133 lbs. Per chart she was 121.2 lbs 11/01/2017 and has lost 8 lbs (6.6% body weight) over the past 2 months, which is not significant for time frame.  Meal Completion: sips of clear liquids  Medications reviewed and include: Colace, NS @ 100 mL/hr.  Labs reviewed: CO2 21, BUN 21, WBC 1.3.  NUTRITION - FOCUSED PHYSICAL EXAM:    Most Recent Value  Orbital Region  Severe depletion  Upper Arm Region  Severe depletion  Thoracic and Lumbar Region  Moderate depletion  Buccal Region  Severe depletion  Temple Region  Severe depletion  Clavicle Bone Region  Severe depletion  Clavicle and Acromion Bone Region  Severe depletion  Scapular Bone Region  Severe depletion  Dorsal Hand  Severe depletion  Patellar Region  Moderate depletion  Anterior Thigh Region  Moderate depletion  Posterior Calf Region  Moderate depletion  Edema (RD Assessment)  None  Hair  Reviewed  Eyes  Reviewed  Mouth  Reviewed  Skin  Reviewed  Nails  Reviewed     Diet Order:  Diet clear liquid Room service appropriate? Yes; Fluid consistency: Thin  EDUCATION NEEDS:   Not appropriate for education at this time  Skin:  Skin Assessment: Reviewed RN Assessment  Last BM:  12/29/2017 - medium type 6  Height:   Ht Readings from Last 1 Encounters:  12/28/17 5' 2"  (1.575 m)    Weight:   Wt Readings from Last 1 Encounters:  12/29/17 114 lb 2 oz (51.8 kg)    Ideal Body Weight:  50 kg  BMI:  Body mass index is 20.87 kg/m.  Estimated Nutritional Needs:   Kcal:  1295-1550 (25-30 kcal/kg)  Protein:  70-80 grams (1.3-1.5 grams/kg)  Fluid:  1.3 L/day (25 mL/kg)  Willey Blade, MS, RD, LDN Office: [phone] Pager: [phone] After Hours/Weekend Pager: [phone]

## 2017-12-30 NOTE — Consult Note (Signed)
Consultation Note Date: 12/30/2017   Patient Name: Lisa Hayden  DOB: 31-Oct-1930  MRN: [id number]  Age / Sex: 84 y.o., female  PCP: Birdie Sons, MD Referring Physician: Nicholes Mango, MD  Reason for Consultation: Establishing goals of care and Psychosocial/spiritual support  HPI/Patient Profile: 84 y.o. female   admitted on 12/28/2017 with recurrent stage IV peritoneal carcinomatosis with metastatic lesions to her lungs and liver admitted through the emergency room with lower abdominal pain times 2 days, with constipation CT of her abdomen shows worsening invasive disease and partial small bowel obstruction.  Patient has been under oncologic care with Dr. Maryjane Hurter.  Patient completed 4 cycles of carboplatinum and Taxol on May 08, 2017. Restaging CT scan on June 13, 2017 reviewed independently with significant improvement of patient's disease burden, although she had a left inguinal lymph node that revealed persistent disease.  She then received 3 additional cycles of carboplatinum and Taxol completing cycle 7 on August 28, 2017.  Patient's most recent CT scan on November 13, 2017 revealed widespread metastatic disease with lesions in her lungs and liver.  Her CA-125 is was trending up.  Patient was offered both enrollment in a clinical trial as well as palliative chemotherapy using Doxil plus Avastin, but she has declined all treatment.   She has had continued physical and functional decline over the past month.  Patient and her family face treatment option decisions, advanced directive decisions, and anticipatory care needs.   Clinical Assessment and Goals of Care:  This NP Wadie Lessen reviewed medical records, received report from team, assessed the patient and then meet at the patient's bedside along with her husband and a friend to discuss diagnosis, prognosis, GOC, EOL wishes disposition and  options.  Concept of Hospice and Palliative Care were discussed  A  discussion was had today regarding advanced directives.  Concepts specific to code status, artifical feeding and hydration, continued IV antibiotics and rehospitalization was had.  The difference between a aggressive medical intervention path  and a palliative comfort care path for this patient at this time was had.  Values and goals of care important to patient and family were attempted to be elicited.  Natural trajectory and expectations at EOL were discussed.  Questions and concerns addressed.   Family encouraged to call with questions or concerns.    PMT will continue to support holistically.     SUMMARY OF RECOMMENDATIONS    -Patient clearly verbalizes an understanding of her limited prognosis.  Focus is comfort quality and dignity.  She wishes to return to her home with hospice services.   Code Status/Advance Care Planning:  DNR   Symptom Management:   Cancer related pain:   In anticipation for discharge home utilize oral agents only.     Morphine ER: 15 mg p.o. every 12 hours scheduled     Morphine/Roxanol solution: 10 mg p.o./sublingual every 4 hours as needed  Palliative Prophylaxis:   Aspiration, Bowel Regimen, Frequent Pain Assessment and Oral Care  Additional Recommendations (Limitations, Scope, Preferences):  Full Comfort Care   Continue IV fluids until discharge  Psycho-social/Spiritual:   Desire for further Chaplaincy support:no  Additional Recommendations: Education on Hospice  Prognosis:   < 4 weeks  Discharge Planning:  Patient currently resides at [place] with her husband.  Plan is home with hospice in the morning   Home with Hospice      Primary Diagnoses: Present on Admission: . SBO (small bowel obstruction) ([place])   I have reviewed the medical record, interviewed the patient and family, and examined the patient. The following aspects are pertinent.  Past  Medical History:  Diagnosis Date  . Cancer [facility])    peritoneal carcinoma, breast cancer, liver mets, colon, ovaries  . Hypertension   . Osteoporosis    Social History   Socioeconomic History  . Marital status: Married    Spouse name: [name]  . Number of children: 1  . Years of education: College  . Highest education level: None  Social Needs  . Financial resource strain: Not hard at all  . Food insecurity - worry: Never true  . Food insecurity - inability: Never true  . Transportation needs - medical: No  . Transportation needs - non-medical: No  Occupational History  . Occupation: Retired  Tobacco Use  . Smoking status: Former Smoker    Types: Cigarettes  . Smokeless tobacco: Never Used  Substance and Sexual Activity  . Alcohol use: Yes    Alcohol/week: 4.2 oz    Types: 7 Standard drinks or equivalent per week    Comment: glass or beer at dinner  . Drug use: No  . Sexual activity: None  Other Topics Concern  . None  Social History Narrative  . None   Family History  Problem Relation Age of Onset  . CVA Mother   . Congestive Heart Failure Mother   . Diabetes Sister   . Asthma Father    Scheduled Meds: . docusate sodium  100 mg Oral BID  . enoxaparin (LOVENOX) injection  40 mg Subcutaneous Q24H   Continuous Infusions: . sodium chloride 100 mL/hr at 12/30/17 0902   PRN Meds:.acetaminophen **OR** acetaminophen, bisacodyl, morphine injection, ondansetron **OR** ondansetron (ZOFRAN) IV, oxyCODONE, sodium phosphate Medications Prior to Admission:  Prior to Admission medications   Medication Sig Start Date End Date Taking? Authorizing Provider  acetaminophen (TYLENOL) 500 MG tablet Take 500 mg by mouth every 6 (six) hours as needed for mild pain or fever.    Yes [provider]  Oxycodone HCl 10 MG TABS Take 1 tablet (10 mg total) by mouth every 6 (six) hours as needed (pain). 11/21/17  Yes Lloyd Huger, MD   Allergies  Allergen Reactions  . Actonel  [Risedronate Sodium] Other (See Comments)    headache  . Cortisone Other (See Comments)    very alert and did not sleep for 24 hrs  . Daypro [Oxaprozin] Other (See Comments)    Reaction: unknown  . Meloxicam Other (See Comments)    Reaction: unknown  . Premarin [Conjugated Estrogens] Other (See Comments)    bleeding  . Prempro [Conj Estrog-Medroxyprogest Ace] Other (See Comments)    Reaction: unknown   Review of Systems  Constitutional: Positive for fatigue.  Neurological: Positive for weakness.    Physical Exam  Cardiovascular: Normal rate, regular rhythm and normal heart sounds.  Pulmonary/Chest: She has decreased breath sounds in the right lower field and the left lower field.  Skin: Skin is warm and dry.    Vital Signs:  BP 126/69 (BP Location: Right Arm)   Pulse 93   Temp 98.3 F (36.8 C) (Oral)   Resp 19   Ht 5\' 2"  (1.575 m)   Wt 51.8 kg (114 lb 2 oz)   SpO2 97%   BMI 20.87 kg/m  Pain Assessment: 0-10 POSS *See Group Information*: S-Acceptable,Sleep, easy to arouse Pain Score: 9    SpO2: SpO2: 97 % O2 Device:SpO2: 97 % O2 Flow Rate: .   IO: Intake/output summary:   Intake/Output Summary (Last 24 hours) at 12/30/2017 1248 Last data filed at 12/30/2017 0530 Gross per 24 hour  Intake 2553.33 ml  Output 0 ml  Net 2553.33 ml    LBM: Last BM Date: 12/29/17 Baseline Weight: Weight: 52.2 kg (115 lb) Most recent weight: Weight: 51.8 kg (114 lb 2 oz)     Palliative Assessment/Data: 30 % at best   Discussed with Dr Margaretmary Eddy  Time In: 1135 Time Out: 1235 Time Total: 60 minutes Greater than 50%  of this time was spent counseling and coordinating care related to the above assessment and plan.  Signed by: Wadie Lessen, NP   Please contact Palliative Medicine Team phone at [phone] for questions and concerns.  For individual provider: See Shea Evans

## 2017-12-30 NOTE — Progress Notes (Signed)
Family Meeting Note  Advance Directive : Yes  Today a meeting took place with the patient and her spouse in the patient's room    The following clinical team members were present during this meeting: md   The following were discussed:Patient's diagnosis: Peritoneal carcinomatosis stage IV with metastases, getting worse with very poor prognosis patient's progosis: Extremely poor  and Goals for treatment:  DO NOT RESUSCITATE with hospice care at home.  Husband is the healthcare POA  Additional follow-up to be provided: Hospitalist team, palliative care, hospice care and oncology as needed  Time spent during discussion: 65 min   Nicholes Mango, MD

## 2017-12-31 MED ORDER — ONDANSETRON HCL 4 MG PO TABS: 4.0000 mg | ORAL_TABLET | Freq: Four times a day (QID) | ORAL | 0 refills | Status: AC | PRN

## 2017-12-31 MED ORDER — MORPHINE SULFATE (CONCENTRATE) 10 MG/0.5ML PO SOLN: 10.0000 mg | ORAL | 0 refills | Status: AC | PRN

## 2017-12-31 MED ORDER — BISACODYL 10 MG RE SUPP: 10.0000 mg | Freq: Every day | RECTAL | 0 refills | Status: AC | PRN

## 2017-12-31 MED ORDER — CHLORHEXIDINE GLUCONATE 0.12 % MT SOLN: 15.0000 mL | Freq: Two times a day (BID) | OROMUCOSAL | 0 refills | Status: AC

## 2017-12-31 MED ORDER — MORPHINE SULFATE ER 15 MG PO TBCR: 15.0000 mg | EXTENDED_RELEASE_TABLET | Freq: Two times a day (BID) | ORAL | 0 refills | Status: AC

## 2017-12-31 MED ORDER — DOCUSATE SODIUM 100 MG PO CAPS: 100.0000 mg | ORAL_CAPSULE | Freq: Two times a day (BID) | ORAL | 0 refills | Status: AC

## 2017-12-31 NOTE — Progress Notes (Signed)
Pt has had adequate pain control during the night with MS contin scheduled. Roxanol 10 mg given for breakthrough. Pt up to BR. Unable to visualize bm because pt flushed. Pt reported that she did have 2 bm's. Pt said she would wait until this morning for her enema. Refused colace at bedtime. Up to BR self. Possible discharge home today with hospice care.

## 2017-12-31 NOTE — Care Management Important Message (Signed)
Important Message  Patient Details  Name: Lisa Hayden MRN: [id number] Date of Birth: [birth year]   Medicare Important Message Given:  Yes    Shelbie Ammons, RN 12/31/2017, 9:37 AM

## 2017-12-31 NOTE — Care Management (Signed)
Discharge to home today per Dr, Margaretmary Eddy. Will be followed by [place] in the home. Needs no medical equipment in the home at this time. Family members are planning to transport per private car. Shelbie Ammons RN MSN CCM Care Management [phone]

## 2017-12-31 NOTE — Progress Notes (Signed)
Follow up visit made to new referral for [facility] services at home. Patient seen sitting in bed, alert, oriented and very talkative. Plan remains for discharge home today. Writer has continued to provide education regarding use of long acting MS contin and liquid morphine. Patient voiced understanding. Emotional support provided. Patient with discharge via car today, signed DNR and prescriptions for MS Contin and liquid morphine to accompany patient at discharge. Discharge summary faxed to referral. Flo Shanks RN, BSN, [facility] and [facility], hospital Liaison [phone]

## 2017-12-31 NOTE — Discharge Instructions (Signed)
Hospice care at home Follow-up with hospice care director as needed

## 2017-12-31 NOTE — Progress Notes (Signed)
Patient ID: BANESA TRISTAN, female   DOB: 02/22/1930, 84 y.o.   MRN: [id number]  This NP visited patient at the bedside as a follow up to  yesterday's [place], palliative needs and emotional support.  Patient is alert and oriented and appears comfortable.  Abdomen remains firm and slightly distended.  Patient reports well-controlled pain on new medication regiment of  -scheduled MS Contin 15 mg p.o. twice daily and  -Roxanol 10 mg sublingual every 4 hours as needed for breakthrough pain  Patient is looking forward to discharge home today with hospice.  Discussed with patient the importance of continued conversation with family and their  medical providers regarding overall plan of care ensuring decisions are within the context of the patients values and GOCs.  Questions and concerns addressed patient encouraged to call with questions or concerns  Time in  0700         Time out 0715    Total time spent on the unit was 15 minute  Greater than 50% of the time was spent in counseling and coordination of care  Wadie Lessen NP  Palliative Medicine Team Team Phone # [phone] Pager [phone]

## 2017-12-31 NOTE — Plan of Care (Signed)
  Education: Knowledge of General Education information will improve 12/31/2017 0555 - Progressing by Jeffie Pollock, RN   Health Behavior/Discharge Planning: Ability to manage health-related needs will improve 12/31/2017 0555 - Progressing by Jeffie Pollock, RN   Clinical Measurements: Ability to maintain clinical measurements within normal limits will improve 12/31/2017 0555 - Progressing by Jeffie Pollock, RN Will remain free from infection 12/31/2017 0555 - Progressing by Jeffie Pollock, RN Respiratory complications will improve 12/31/2017 0555 - Progressing by Jeffie Pollock, RN Cardiovascular complication will be avoided 12/31/2017 0555 - Progressing by Jeffie Pollock, RN   Activity: Risk for activity intolerance will decrease 12/31/2017 0555 - Progressing by Jeffie Pollock, RN   Nutrition: Adequate nutrition will be maintained 12/31/2017 0555 - Progressing by Jeffie Pollock, RN   Coping: Level of anxiety will decrease 12/31/2017 0555 - Progressing by Jeffie Pollock, RN   Elimination: Will not experience complications related to bowel motility 12/31/2017 0555 - Progressing by Jeffie Pollock, RN Will not experience complications related to urinary retention 12/31/2017 0555 - Progressing by Jeffie Pollock, RN   Pain Managment: General experience of comfort will improve 12/31/2017 0555 - Progressing by Jeffie Pollock, RN   Safety: Ability to remain free from injury will improve 12/31/2017 0555 - Progressing by Jeffie Pollock, RN   Skin Integrity: Risk for impaired skin integrity will decrease 12/31/2017 0555 - Progressing by Jeffie Pollock, RN   Education: Knowledge of the prescribed therapeutic regimen will improve 12/31/2017 0555 - Progressing by Jeffie Pollock, RN   Pain Management: Satisfaction with pain management regimen will improve 12/31/2017 0555 - Progressing by Jeffie Pollock, RN

## 2017-12-31 NOTE — Discharge Summary (Signed)
[facility] NAME: Lisa Hayden    MR#:  [id number]  DATE OF BIRTH:  09/15/1930  DATE OF ADMISSION:  12/28/2017 ADMITTING PHYSICIAN: Harrie Foreman, MD  DATE OF DISCHARGE:  12/31/17  PRIMARY CARE PHYSICIAN: Birdie Sons, MD    ADMISSION DIAGNOSIS:  Small bowel obstruction ([place]) [K56.609] Abdominal pain, unspecified abdominal location [R10.9]  DISCHARGE DIAGNOSIS:  Active Problems:   SBO (small bowel obstruction) ([place])   Cancer associated pain   Metastatic cancer to liver [facility])   Palliative care by specialist   DNR (do not resuscitate)   SECONDARY DIAGNOSIS:   Past Medical History:  Diagnosis Date  . Cancer [facility])    peritoneal carcinoma, breast cancer, liver mets, colon, ovaries  . Hypertension   . Osteoporosis     HOSPITAL COURSE:  HPI: The patient with past medical history of peritoneal carcinomatosis with metastatic lesions to her lungs and liver presents to the emergency department complaining of lower abdominal pain times 2 days.  She reports one episode of nonbloody emesis.  She states that she is unable to have a bowel movement and has not had one in months.  She disimpacted herself in order to defecate.  CT of her abdomen shows worsening invasive disease and partial small bowel obstruction.  Surgical service was consulted who recommended that the patient was not a surgical candidate.  She also declines NG tube placement at this time.  Due to ongoing pain and consideration for palliative care the emergency department staff called the hospitalist service for admission.    .  PartialSBO:  Stool softeners, laxatives and 1 Fleet enema to see if that helps for symptomatic relief 2. 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  According to the CT scan of the abdomen is getting worse.84  Patient has decided to pursue hospice care after discussing with oncologist Dr. Grayland Ormond.  Palliative  care is involved and referral made to hospice care.  Will discharge patient home today with hospice care at home  pain management as needed MS Contin 15mg  p.o. twice daily and Roxanol 10 mL sublingually every 4 hours as needed for breakthrough pain 3.  Essential hypertension blood pressure currently stable:.   4.   Miscellaneous Lovenox for DVT prophylaxis  Disposition  discharge  with hospice care at home    DISCHARGE CONDITIONS:   FAIR  CONSULTS OBTAINED:     PROCEDURES  NONE   DRUG ALLERGIES:   Allergies  Allergen Reactions  . Actonel [Risedronate Sodium] Other (See Comments)    headache  . Cortisone Other (See Comments)    very alert and did not sleep for 24 hrs  . Daypro [Oxaprozin] Other (See Comments)    Reaction: unknown  . Meloxicam Other (See Comments)    Reaction: unknown  . Premarin [Conjugated Estrogens] Other (See Comments)    bleeding  . Prempro [Conj Estrog-Medroxyprogest Ace] Other (See Comments)    Reaction: unknown    DISCHARGE MEDICATIONS:   Allergies as of 12/31/2017      Reactions   Actonel [risedronate Sodium] Other (See Comments)   headache   Cortisone Other (See Comments)   very alert and did not sleep for 24 hrs   Daypro [oxaprozin] Other (See Comments)   Reaction: unknown   Meloxicam Other (See Comments)   Reaction: unknown   Premarin [conjugated Estrogens] Other (See Comments)   bleeding   Prempro [conj Estrog-medroxyprogest Ace] Other (See Comments)  Reaction: unknown      Medication List    STOP taking these medications   Oxycodone HCl 10 MG Tabs     TAKE these medications   acetaminophen 500 MG tablet Commonly known as:  TYLENOL Take 500 mg by mouth every 6 (six) hours as needed for mild pain or fever.   bisacodyl 10 MG suppository Commonly known as:  DULCOLAX Place 1 suppository (10 mg total) rectally daily as needed for moderate constipation.   chlorhexidine 0.12 % solution Commonly known as:  PERIDEX 15 mLs  by Mouth Rinse route 2 (two) times daily.   docusate sodium 100 MG capsule Commonly known as:  COLACE Take 1 capsule (100 mg total) by mouth 2 (two) times daily.   morphine 15 MG 12 hr tablet Commonly known as:  MS CONTIN Take 1 tablet (15 mg total) by mouth every 12 (twelve) hours.   morphine CONCENTRATE 10 MG/0.5ML Soln concentrated solution Take 0.5 mLs (10 mg total) by mouth every 4 (four) hours as needed for moderate pain or shortness of breath.   ondansetron 4 MG tablet Commonly known as:  ZOFRAN Take 1 tablet (4 mg total) by mouth every 6 (six) hours as needed for nausea.        DISCHARGE INSTRUCTIONS:   Hospice care at home Follow-up with hospice care director as needed  DIET:  Regular diet as tolerated  DISCHARGE CONDITION:  Fair  ACTIVITY:  Activity as tolerated  OXYGEN:  Home Oxygen: No.   Oxygen Delivery: room air  DISCHARGE LOCATION:  home   If you experience worsening of your admission symptoms, develop shortness of breath, life threatening emergency, suicidal or homicidal thoughts you must seek medical attention immediately by calling 911 or calling your MD immediately  if symptoms less severe.  You Must read complete instructions/literature along with all the possible adverse reactions/side effects for all the Medicines you take and that have been prescribed to you. Take any new Medicines after you have completely understood and accpet all the possible adverse reactions/side effects.   Please note  You were cared for by a hospitalist during your hospital stay. If you have any questions about your discharge medications or the care you received while you were in the hospital after you are discharged, you can call the unit and asked to speak with the hospitalist on call if the hospitalist that took care of you is not available. Once you are discharged, your primary care physician will handle any further medical issues. Please note that NO REFILLS for any  discharge medications will be authorized once you are discharged, as it is imperative that you return to your primary care physician (or establish a relationship with a primary care physician if you do not have one) for your aftercare needs so that they can reassess your need for medications and monitor your lab values.     Today  Chief Complaint  Patient presents with  . Abdominal Pain  . Cancer   Patient is resting comfortably but constipated manual disimpaction is helping with  the bowel movement.  Patient is agreeable to go home with hospice care at home  ROS:  CONSTITUTIONAL: Denies fevers, chills. Denies any fatigue, weakness.  EYES: Denies blurry vision, double vision, eye pain. EARS, NOSE, THROAT: Denies tinnitus, ear pain, hearing loss. RESPIRATORY: Denies cough, wheeze, shortness of breath.  CARDIOVASCULAR: Denies chest pain, palpitations, edema.  GASTROINTESTINAL: Denies nausea, vomiting, diarrhea, abdominal pain. Denies bright red blood per rectum. GENITOURINARY: Denies dysuria,  hematuria. ENDOCRINE: Denies nocturia or thyroid problems. HEMATOLOGIC AND LYMPHATIC: Denies easy bruising or bleeding. SKIN: Denies rash or lesion. MUSCULOSKELETAL: Denies pain in neck, back, shoulder, knees, hips or arthritic symptoms.  NEUROLOGIC: Denies paralysis, paresthesias.  PSYCHIATRIC: Denies anxiety or depressive symptoms.   VITAL SIGNS:  Blood pressure (!) 151/63, pulse 84, temperature 98 F (36.7 C), temperature source Oral, resp. rate 20, height 5\' 2"  (1.575 m), weight 51.8 kg (114 lb 2 oz), SpO2 95 %.  I/O:    Intake/Output Summary (Last 24 hours) at 12/31/2017 1156 Last data filed at 12/31/2017 0900 Gross per 24 hour  Intake 240 ml  Output -  Net 240 ml    PHYSICAL EXAMINATION:  GENERAL:  84 y.o.-year-old patient lying in the bed with no acute distress.  EYES: Pupils equal, round, reactive to light and accommodation. No scleral icterus. Extraocular muscles intact.   HEENT: Head atraumatic, normocephalic. Oropharynx and nasopharynx clear.  NECK:  Supple, no jugular venous distention. No thyroid enlargement, no tenderness.  LUNGS: Normal breath sounds bilaterally, no wheezing, rales,rhonchi or crepitation. No use of accessory muscles of respiration.  CARDIOVASCULAR: S1, S2 normal. No murmurs, rubs, or gallops.  ABDOMEN: Soft, non-tender, non-distended. Bowel sounds present.  EXTREMITIES: No pedal edema, cyanosis, or clubbing.  NEUROLOGIC: Cranial nerves II through XII are intact.  Sensation intact. Gait not checked.  PSYCHIATRIC: The patient is alert and oriented x 3.  SKIN: No obvious rash, lesion, or ulcer.   DATA REVIEW:   CBC Recent Labs  Lab 12/30/17 0344  WBC 1.3*  HGB 10.2*  HCT 31.0*  PLT 267    Chemistries  Recent Labs  Lab 12/28/17 2108 12/30/17 0344  NA 135 135  K 3.8 3.6  CL 97* 101  CO2 24 21*  GLUCOSE 128* 81  BUN 25* 21*  CREATININE 0.98 0.92  CALCIUM 8.8* 7.9*  AST 31  --   ALT 24  --   ALKPHOS 75  --   BILITOT 1.8*  --     Cardiac Enzymes No results for input(s): TROPONINI in the last 168 hours.  Microbiology Results  Results for orders placed or performed in visit on 12/13/17  Microscopic Examination     Status: Abnormal   Collection Time: 12/13/17  3:02 PM  Result Value Ref Range Status   WBC, UA 0-5 0 - 5 /hpf Final   RBC, UA 0-2 0 - 2 /hpf Final   Epithelial Cells (non renal) None seen 0 - 10 /hpf Final   Mucus, UA Present (A) Not Estab. Final   Bacteria, UA Moderate (A) None seen/Few Final    RADIOLOGY:  Ct Abdomen Pelvis W Contrast  Result Date: 12/28/2017 CLINICAL DATA:  Patient presents to Emergency Department via EMS [facility] (independent portion) with complaints of abdominal pain with Nausea and Vomiting x 2. 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 EXAM: CT ABDOMEN AND PELVIS WITH CONTRAST TECHNIQUE: Multidetector CT imaging of the abdomen and pelvis was performed  using the standard protocol following bolus administration of intravenous contrast. CONTRAST:  41mL ISOVUE-300 IOPAMIDOL (ISOVUE-300) INJECTION 61% COMPARISON:  11/13/2017 FINDINGS: Lower chest: Small pleural effusions. There is associated dependent lower lobe atelectasis. There are several lung base nodules larger than on the prior CT. A pleural-based left lateral lower lobe nodule, image 10, series 4, currently measures 8 mm in long axis, previously 5 mm. Hepatobiliary: There are numerous liver masses consistent with widespread metastatic disease. This has significantly worsened when compared  to the prior CT. Several reference measurements were made. There is a lesion in the lateral segment of the left lobe, image 13, series 2, measuring 19 mm, previously 13 mm. Lesion in the right lobe, image 24, measures 3 cm, previously 2 cm. Image more inferiorly in the right lobe also measures 3 cm, previously 2 cm. Are multiple small new masses. Multiple gallstones. No evidence of acute cholecystitis. No bile duct dilation. Pancreas: No pancreatic mass or inflammation. Spleen: Normal in size without focal abnormality. Adrenals/Urinary Tract: 16 mm left adrenal mass is stable from the prior study. Normal right adrenal gland. Moderate left hydronephrosis. This is despite the presence of a left ureteral stent, which is well positioned. Left kidney shows decreased enhancement compared to the right and no contrast in the collecting system on the delayed sequence. No right hydronephrosis. No renal masses. Right ureters normal in course and in caliber. Bladder is unremarkable. Stomach/Bowel: There is a small bowel anastomosis staple line in the left pelvis, unchanged. Stomach is moderately distended. No wall thickening or inflammation. There is mild dilation of the small bowel with multiple air-fluid levels, maximum diameter 3.2 cm. The terminal ileum is decompressed. Apparent obstruction point is in the left pelvis adjacent to the  sigmoid colon. There is an area of relative narrowing of the sigmoid colon in the same location. Colon proximal to this is mildly distended and shows increased stool. Vascular/Lymphatic: Several prominent lymph nodes along the gastrohepatic ligament, none pathologically enlarged by size criteria. No discrete enlarged lymph nodes. Reproductive: Status post hysterectomy. Other: There is abnormal soft tissue that extends along the left retroperitoneum between the kidney and aorta, abutting the left psoas muscle and surrounding the left ureter. This extends into the left pelvis. No ascites. Musculoskeletal: No fracture or acute finding. No osteoblastic or osteolytic lesions. IMPRESSION: 1. Worsening locally invasive and metastatic carcinoma. There is increased abnormal soft tissue along the left retroperitoneum consistent with local spread of carcinoma. This surrounds the left ureter. It also abuts the mid sigmoid colon, which appears narrowed. Lung base nodules have increased in size. There are enlarged and and new liver metastatic lesions. 2. There is a partial small bowel obstruction. The transition point appears to be in the left pelvis, likely also due to the local spread of retroperitoneal carcinoma. The narrowed area of the sigmoid colon appears to cause a low grade partial colonic obstruction, with increased stool noted throughout the colon above the area of narrowing. 3. Left hydronephrosis, with decreased enhancement and excretion of the left kidney, despite the presence of a well-positioned left ureteral stent. Stent is new since the prior CT. 4. New bilateral pleural effusions. Electronically Signed   By: Lajean Manes M.D.   On: 12/28/2017 23:55    EKG:   Orders placed or performed in visit on 05/02/15  . EKG 12-Lead      Management plans discussed with the patient, family and they are in agreement.  CODE STATUS:     Code Status Orders  (From admission, onward)        Start     Ordered    12/29/17 0335  Do not attempt resuscitation (DNR)  Continuous    Question Answer Comment  In the event of cardiac or respiratory ARREST Do not call a "code blue"   In the event of cardiac or respiratory ARREST Do not perform Intubation, CPR, defibrillation or ACLS   In the event of cardiac or respiratory ARREST Use medication by any route, position,  wound care, and other measures to relive pain and suffering. May use oxygen, suction and manual treatment of airway obstruction as needed for comfort.      12/29/17 0334    Code Status History    Date Active Date Inactive Code Status Order ID Comments User Context   12/29/2017 02:59 12/29/2017 02:59 Full Code [id number]  Harrie Foreman, MD ED   11/14/2017 18:13 11/15/2017 15:49 Full Code [id number]  Ammie Dalton, RN Inpatient   11/14/2017 14:54 11/14/2017 18:13 DNR [id number]  Loletha Grayer, MD Inpatient   11/14/2017 04:59 11/14/2017 14:54 Full Code [id number]  Harrie Foreman, MD Inpatient   01/16/2017 01:54 01/22/2017 03:20 Full Code [id number]  Lance Coon, MD Inpatient    Advance Directive Documentation     Most Recent Value  Type of Advance Directive  Out of facility DNR (pink MOST or yellow form)  Pre-existing out of facility DNR order (yellow form or pink MOST form)  Pink MOST form placed in chart (order not valid for inpatient use)  "MOST" Form in Place?  No data      TOTAL TIME TAKING CARE OF THIS PATIENT: 43  minutes.   Note: This dictation was prepared with Dragon dictation along with smaller phrase technology. Any transcriptional errors that result from this process are unintentional.   @[facility] @  on 12/31/2017 at 11:56 AM  Between 7am to 6pm - Pager - [phone]  After 6pm go to [email or web address] - password [email or web address] [facility]  Office  [phone]  CC: Primary care physician; Birdie Sons, MD

## 2018-01-01 ENCOUNTER — Encounter: Payer: Self-pay | Admitting: Oncology

## 2018-01-01 ENCOUNTER — Inpatient Hospital Stay: Payer: Medicare Other

## 2018-01-01 ENCOUNTER — Inpatient Hospital Stay: Payer: Medicare Other | Admitting: Oncology

## 2018-01-07 ENCOUNTER — Telehealth: Payer: Self-pay | Admitting: *Deleted

## 2018-01-07 ENCOUNTER — Other Ambulatory Visit: Payer: Self-pay | Admitting: *Deleted

## 2018-01-07 MED ORDER — LACTULOSE 10 GM/15ML PO SOLN: 10.0000 g | Freq: Two times a day (BID) | ORAL | 1 refills | Status: AC | PRN

## 2018-01-07 MED ORDER — FENTANYL 25 MCG/HR TD PT72: 25.0000 ug | MEDICATED_PATCH | TRANSDERMAL | 0 refills | Status: AC

## 2018-01-07 NOTE — Telephone Encounter (Signed)
Yes to fentanyl 24mcg every 72hrs.  They can also try lactulose to see if it helps, but i suspect her pain is from progression of disease and not constipation.

## 2018-01-07 NOTE — Telephone Encounter (Signed)
[name] with Hospice called to report that patient has 3+ PE for shins down bilaterally. Using Roxanol 10 mg every 4 hours not controlling her pain and has no long acting medicine. Asking if Lactulose would help her stomach pain and lack of BM with the obstruction. Only eating jello popsicles. Do you want to start her on Fentanyl?

## 2018-01-14 ENCOUNTER — Telehealth: Payer: Self-pay | Admitting: *Deleted

## 2018-01-17 NOTE — Telephone Encounter (Signed)
[facility]ospice Home called to report that patient expired at 12:30 AM this morning

## 2018-01-17 DEATH — deceased

## 2018-01-21 ENCOUNTER — Other Ambulatory Visit: Payer: Self-pay | Admitting: Family Medicine

## 2018-01-21 DIAGNOSIS — M810 Age-related osteoporosis without current pathological fracture: Secondary | ICD-10-CM

## 2018-01-27 ENCOUNTER — Ambulatory Visit: Payer: Medicare Other | Admitting: Urology

## 2018-01-30 ENCOUNTER — Other Ambulatory Visit: Payer: Self-pay | Admitting: Nurse Practitioner

## 2018-02-14 ENCOUNTER — Telehealth: Payer: Self-pay | Admitting: Family Medicine

## 2018-02-14 NOTE — Telephone Encounter (Signed)
Pt's husband phoned saying he has recd several messages for his wife but she is diseased.  I see where she is marked as diseased.  teri

## 2018-02-14 NOTE — Telephone Encounter (Signed)
No one from this office has called pt's home.

## 2019-11-14 IMAGING — CT CT ABD-PELV W/ CM
2 of 5 series · 15 of 46 positions shown, 17 images · IV contrast (APPLIED)
Comparison: CT the abdomen and pelvis 09/30/2017.

CLINICAL DATA: 87-year-old female with complaint of right-sided
flank pain radiating. Diagnosed with ovarian cancer in December 2016.

EXAM:
CT ABDOMEN AND PELVIS WITH CONTRAST
TECHNIQUE: Multidetector CT imaging of the abdomen and pelvis was performed
using the standard protocol following bolus administration of
intravenous contrast.
CONTRAST:  60mL XYMA3L-800 IOPAMIDOL (XYMA3L-800) INJECTION 61%

[Series 2: axial st · axial · 0.63mm/px · z∈[-694,-340]mm · 12 of 81 slices shown, 14 images]
[im 5/81  soft-tissue]
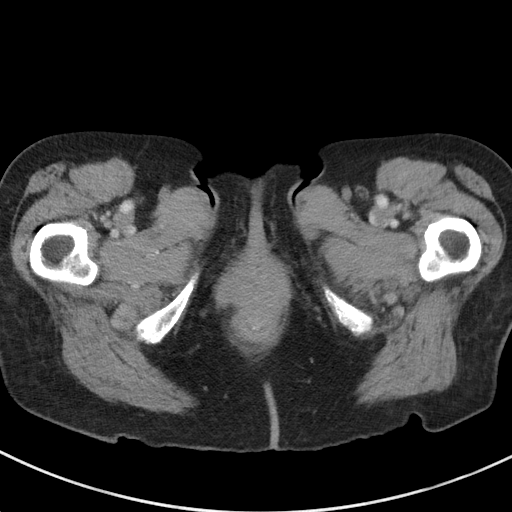
[im 5/81  bone]
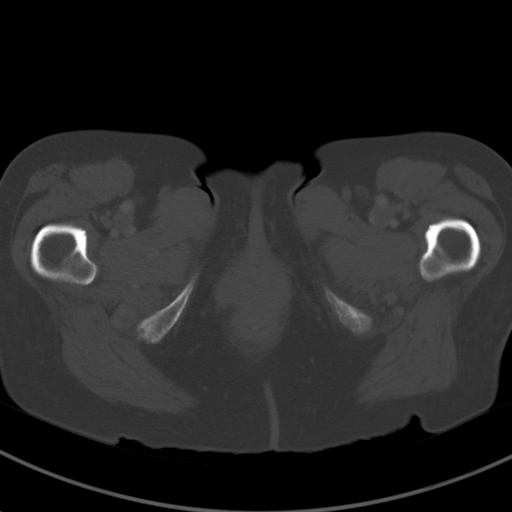
[im 15/81  soft-tissue]
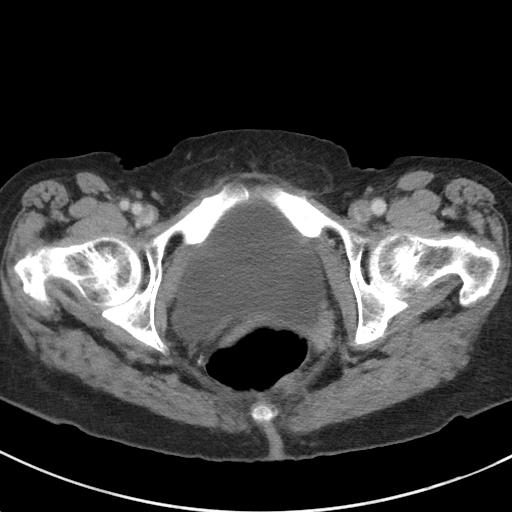
[im 19/81  soft-tissue]
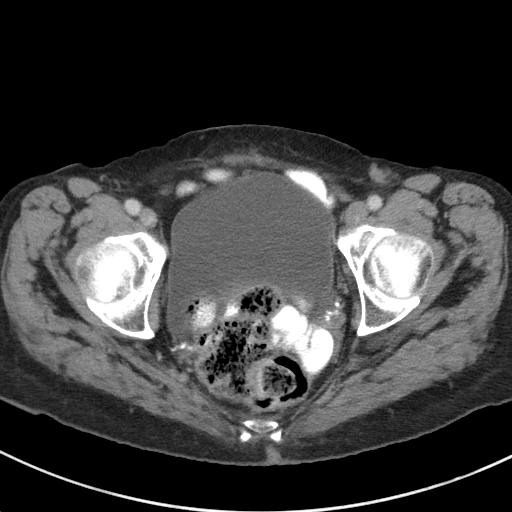
[im 24/81  soft-tissue]
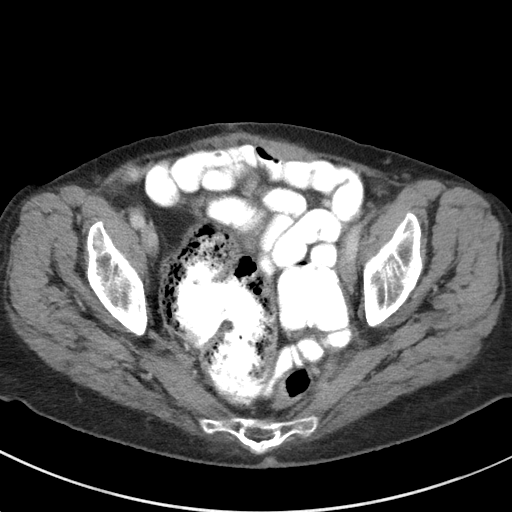
[im 33/81  soft-tissue]
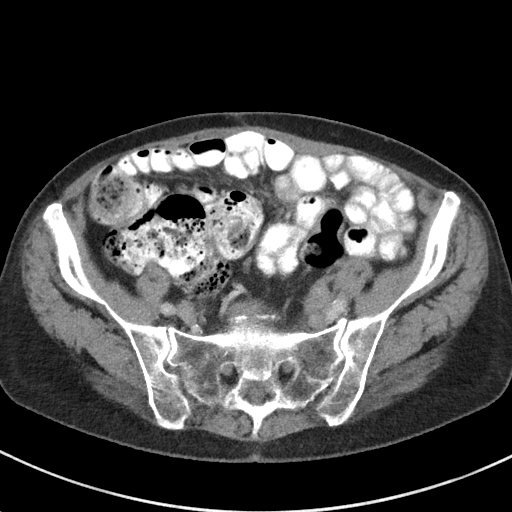
[im 38/81  soft-tissue]
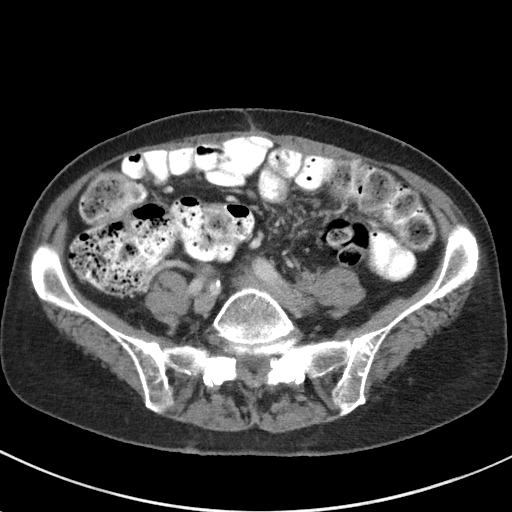
[im 43/81  soft-tissue]
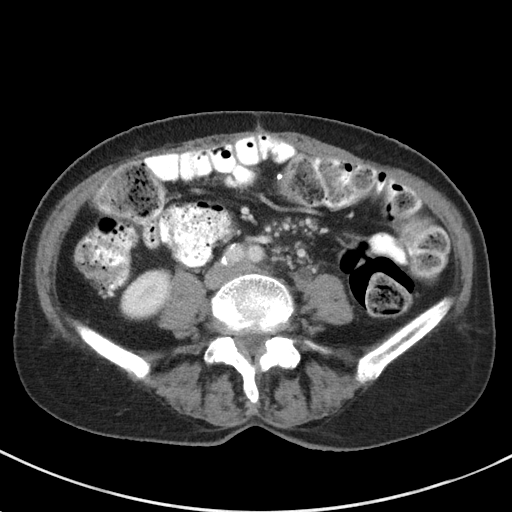
[im 52/81  soft-tissue]
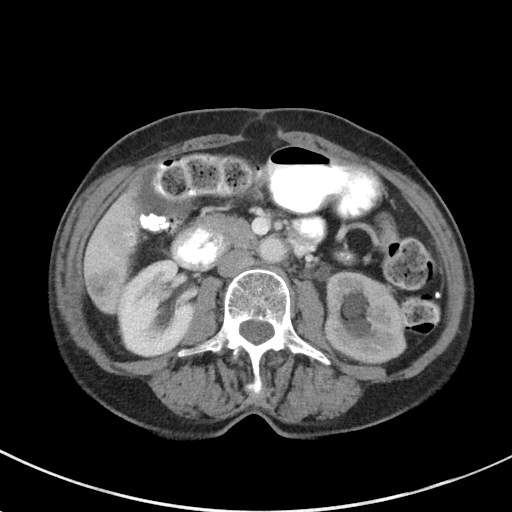
[im 57/81  soft-tissue]
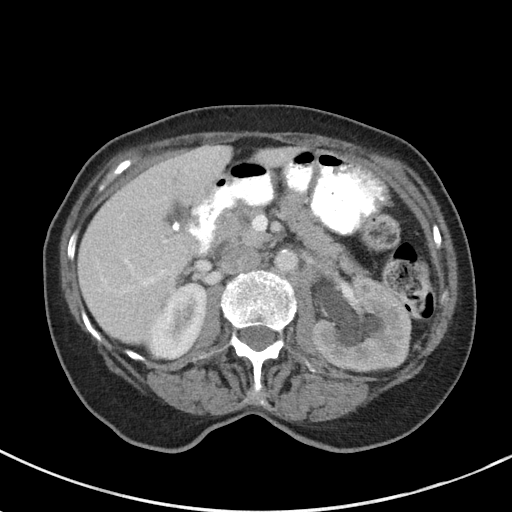
[im 57/81  bone]
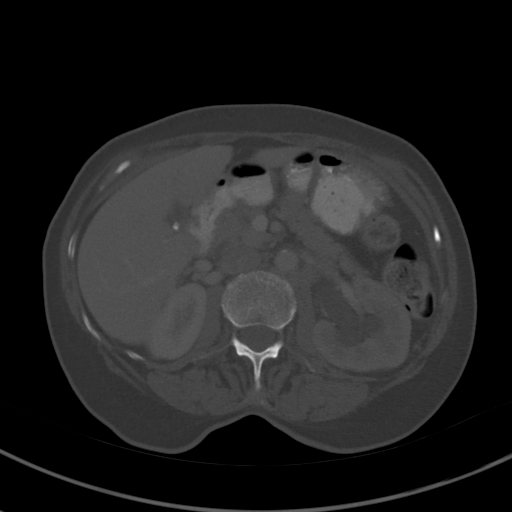
[im 62/81  soft-tissue]
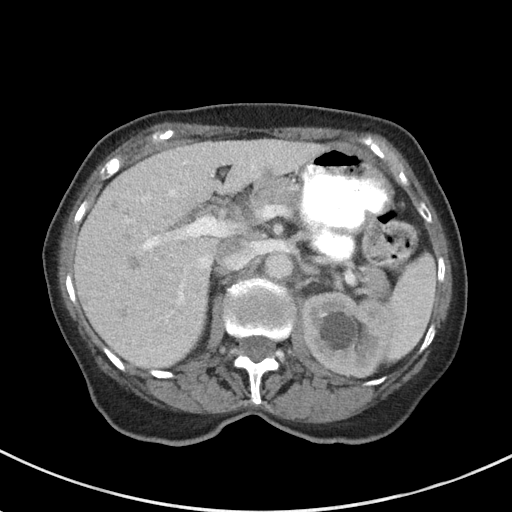
[im 71/81  soft-tissue]
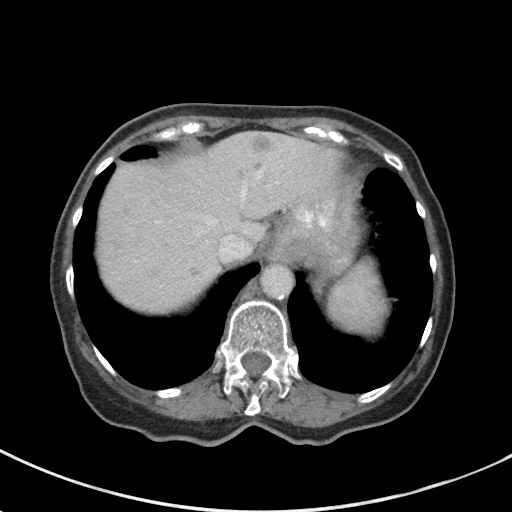
[im 76/81  soft-tissue]
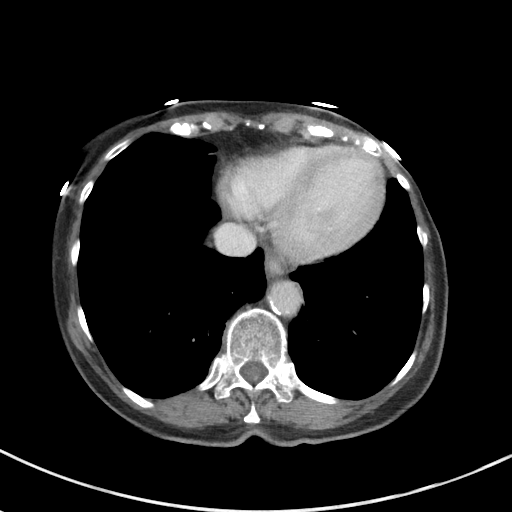

[Series 5: coronal st · coronal · 0.68mm/px · 3 of 68 slices shown]
[im 23/68  soft-tissue]
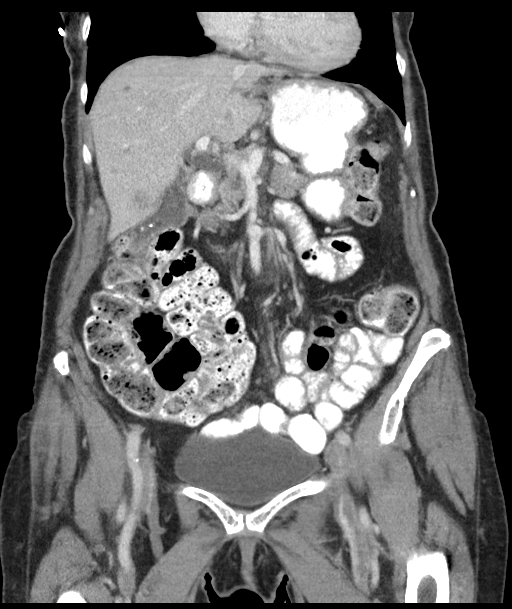
[im 30/68  soft-tissue]
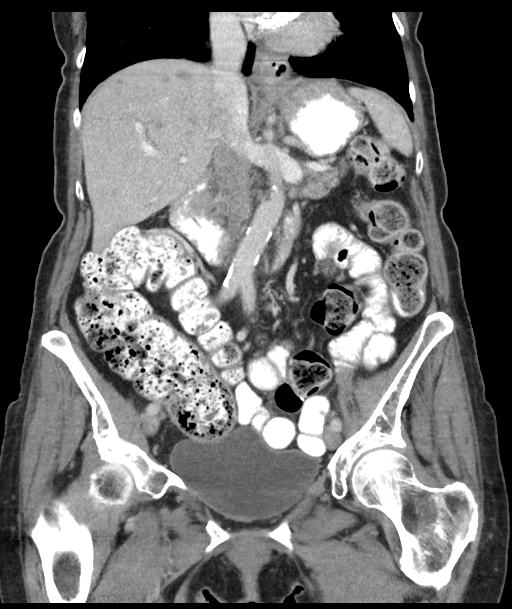
[im 38/68  soft-tissue]
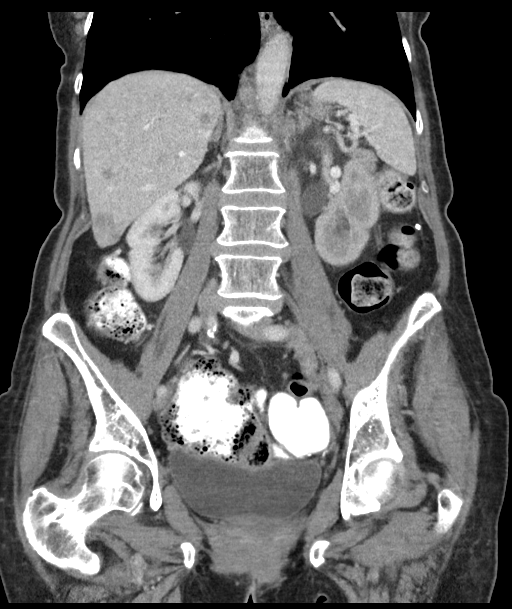

[15 of 46 positions shown; findings below may reference images not displayed]

FINDINGS: Lower chest: Several new areas of small pulmonary nodules scattered
throughout both lung bases, measuring up to 6 mm on the left (axial
image 12 of series 4), nonspecific, but suspicious for potential
metastatic disease.

Hepatobiliary: Interval increase in number and size of what are now
innumerable ill-defined hypovascular liver lesions scattered
throughout all aspects of the liver, with the largest of these
currently measuring up to 2 cm in diameter between segments 4B and 5
(axial image 27 of series 2), previously only 11 mm on 09/30/2017,
most compatible with progressive metastatic disease. No intra or
extrahepatic biliary ductal dilatation. Numerous calcified
gallstones lying dependently in the gallbladder. No findings to
suggest an acute cholecystitis at this time.

Pancreas: No pancreatic mass. No pancreatic ductal dilatation. No
pancreatic or peripancreatic fluid or inflammatory changes.

Spleen: Unremarkable.

Adrenals/Urinary Tract: Mild thickening of the adrenal glands
bilaterally without definite dominant nodule. Right kidney is normal
in appearance. New moderate left-sided hydroureteronephrosis which
appears related to obstruction of the middle third of the left
ureter by what appears to be a retroperitoneal nodal mass (see
discussion below). Urinary bladder is normal in appearance.

Stomach/Bowel: Normal appearance of the stomach. No pathologic
dilatation of small bowel or colon. The appendix is not confidently
identified and may be surgically absent. Regardless, there are no
inflammatory changes noted adjacent to the cecum to suggest the
presence of an acute appendicitis at this time.

Vascular/Lymphatic: Aortic atherosclerosis, without evidence of
aneurysm or dissection in the abdominal or pelvic vasculature.
Malignant appearing soft tissue along the left pelvic side wall
causing obstruction of the middle third of the left ureter (axial
image 48 of series 2) measuring 2.2 x 1.5 cm, favored to represent a
metastatic lymph node. Additional enhancing soft tissue in the upper
left retroperitoneum adjacent to the left renal hilum measuring
cm in short axis (previous only only 1 cm) on axial image 24 of
series 2, also suspicious for metastatic lymphadenopathy.

Reproductive: Status post total abdominal hysterectomy and bilateral
salpingo-oophorectomy.

Other: Status post omentectomy. No significant volume of ascites. No
pneumoperitoneum.

Musculoskeletal: There are no aggressive appearing lytic or blastic
lesions noted in the visualized portions of the skeleton.
IMPRESSION: 1. Today's study demonstrates progressive metastatic disease, with
increased number and size of innumerable hepatic metastases,
interval development of small pulmonary nodules throughout the lung
bases which are also suspicious for metastatic lesions, and
worsening left-sided pelvic and retroperitoneal lymphadenopathy.
2. New moderate left-sided hydroureteronephrosis which appears
related to obstruction of the middle third of the left ureter
related to the left pelvic lymphadenopathy.
3. Aortic atherosclerosis.
Aortic Atherosclerosis ([id number]).
